# Patient Record
Sex: Female | Born: 1965 | Race: Black or African American | Hispanic: No | Smoking: Former smoker
Health system: Southern US, Community
[De-identification: ages and names within clinical notes are randomized; demographics above are authoritative.]

## PROBLEM LIST (undated history)

## (undated) DIAGNOSIS — M1990 Unspecified osteoarthritis, unspecified site: Secondary | ICD-10-CM

## (undated) DIAGNOSIS — IMO0002 Reserved for concepts with insufficient information to code with codable children: Secondary | ICD-10-CM

## (undated) DIAGNOSIS — D332 Benign neoplasm of brain, unspecified: Secondary | ICD-10-CM

## (undated) DIAGNOSIS — I10 Essential (primary) hypertension: Secondary | ICD-10-CM

## (undated) DIAGNOSIS — M329 Systemic lupus erythematosus, unspecified: Secondary | ICD-10-CM

## (undated) DIAGNOSIS — M549 Dorsalgia, unspecified: Secondary | ICD-10-CM

## (undated) HISTORY — PX: BRAIN SURGERY: SHX531

## (undated) HISTORY — PX: JOINT REPLACEMENT: SHX530

## (undated) HISTORY — PX: CORONARY ANGIOPLASTY WITH STENT PLACEMENT: SHX49

## (undated) HISTORY — PX: SHUNT REPLACEMENT: SHX5403

## (undated) HISTORY — PX: ABDOMINAL HYSTERECTOMY: SHX81

---

## 1998-10-26 ENCOUNTER — Emergency Department (HOSPITAL_COMMUNITY): Admission: EM | Admit: 1998-10-26 | Discharge: 1998-10-26 | Payer: Self-pay

## 1998-10-26 ENCOUNTER — Emergency Department (HOSPITAL_COMMUNITY): Admission: EM | Admit: 1998-10-26 | Discharge: 1998-10-26 | Payer: Self-pay | Admitting: Emergency Medicine

## 2011-10-23 ENCOUNTER — Emergency Department (HOSPITAL_COMMUNITY)
Admission: EM | Admit: 2011-10-23 | Discharge: 2011-10-24 | Disposition: A | Discharge status: Home / Self Care | Payer: Medicaid Other | Attending: Emergency Medicine | Admitting: Emergency Medicine

## 2011-10-23 ENCOUNTER — Encounter (HOSPITAL_COMMUNITY): Payer: Self-pay | Admitting: *Deleted

## 2011-10-23 ENCOUNTER — Emergency Department (INDEPENDENT_AMBULATORY_CARE_PROVIDER_SITE_OTHER)
Admission: EM | Admit: 2011-10-23 | Discharge: 2011-10-23 | Disposition: A | Discharge status: Nursing Home (Includes ICF) | Payer: Medicaid Other | Source: Home / Self Care | Attending: Emergency Medicine | Admitting: Emergency Medicine

## 2011-10-23 ENCOUNTER — Encounter: Payer: Self-pay | Admitting: *Deleted

## 2011-10-23 DIAGNOSIS — M25569 Pain in unspecified knee: Secondary | ICD-10-CM | POA: Insufficient documentation

## 2011-10-23 DIAGNOSIS — R609 Edema, unspecified: Secondary | ICD-10-CM | POA: Insufficient documentation

## 2011-10-23 DIAGNOSIS — R5383 Other fatigue: Secondary | ICD-10-CM | POA: Insufficient documentation

## 2011-10-23 DIAGNOSIS — T391X1A Poisoning by 4-Aminophenol derivatives, accidental (unintentional), initial encounter: Secondary | ICD-10-CM

## 2011-10-23 DIAGNOSIS — M329 Systemic lupus erythematosus, unspecified: Secondary | ICD-10-CM

## 2011-10-23 DIAGNOSIS — T394X1A Poisoning by antirheumatics, not elsewhere classified, accidental (unintentional), initial encounter: Secondary | ICD-10-CM

## 2011-10-23 DIAGNOSIS — M79609 Pain in unspecified limb: Secondary | ICD-10-CM | POA: Insufficient documentation

## 2011-10-23 DIAGNOSIS — R5381 Other malaise: Secondary | ICD-10-CM | POA: Insufficient documentation

## 2011-10-23 DIAGNOSIS — R110 Nausea: Secondary | ICD-10-CM | POA: Insufficient documentation

## 2011-10-23 DIAGNOSIS — IMO0001 Reserved for inherently not codable concepts without codable children: Secondary | ICD-10-CM | POA: Insufficient documentation

## 2011-10-23 DIAGNOSIS — M549 Dorsalgia, unspecified: Secondary | ICD-10-CM | POA: Insufficient documentation

## 2011-10-23 DIAGNOSIS — M129 Arthropathy, unspecified: Secondary | ICD-10-CM | POA: Insufficient documentation

## 2011-10-23 DIAGNOSIS — Z79899 Other long term (current) drug therapy: Secondary | ICD-10-CM | POA: Insufficient documentation

## 2011-10-23 HISTORY — DX: Dorsalgia, unspecified: M54.9

## 2011-10-23 HISTORY — DX: Unspecified osteoarthritis, unspecified site: M19.90

## 2011-10-23 HISTORY — DX: Systemic lupus erythematosus, unspecified: M32.9

## 2011-10-23 HISTORY — DX: Reserved for concepts with insufficient information to code with codable children: IMO0002

## 2011-10-23 HISTORY — DX: Benign neoplasm of brain, unspecified: D33.2

## 2011-10-23 NOTE — ED Notes (Signed)
Pt  Says  She  Has  Had  Lupos  For 20  Years  She  Reports  She  Is  Out  Of  Her pain meds      She  Reports  Pains  In her  Joints     And  She  States   She  Is  Having flair up of her lupus

## 2011-10-23 NOTE — ED Provider Notes (Signed)
History     CSN: [id number] Arrival date & time: 10/23/2011  8:39 PM   First MD Initiated Contact with Patient 10/23/11 2014      Chief Complaint  Patient presents with  . Joint Pain   HPI Comments: Pt presents for refill of percocet. States has been taking 12 10/325 pills/day since she filled rx for #60 on 11/25.  Also taking otc tylenol in addition to the percocet.  Was told to come to [facility] by family member because she was "taking too much." pt states is having lupus flare and is unable to keep her pain under control. No N/V, abd pain. C/o back pain, diffuse joint pain.   The history is provided by the patient.    Past Medical History  Diagnosis Date  . Arthritis   . Lupus   . Back pain   . Brain tumor (benign)     Past Surgical History  Procedure Date  . Abdominal hysterectomy   . Brain surgery   . Joint replacement   . Coronary angioplasty with stent placement   . Shunt replacement     Family History  Problem Relation Age of Onset  . Hypertension Mother   . Lupus Mother   . Hypertension Father   . Lupus Father     History  Substance Use Topics  . Smoking status: Current Everyday Smoker  . Smokeless tobacco: Not on file  . Alcohol Use: No    OB History    Grav Para Term Preterm Abortions TAB SAB Ect Mult Living                  Review of Systems  Constitutional: Negative for fever.  Respiratory: Negative for chest tightness and wheezing.   Cardiovascular: Negative for chest pain.  Gastrointestinal: Negative for nausea, vomiting and abdominal pain.  Musculoskeletal: Positive for back pain and arthralgias.  Skin: Negative for rash.  Neurological: Negative for weakness.    Allergies  Codeine and Morphine and related  Home Medications   Current Outpatient Rx  Name Route Sig Dispense Refill  . DIAZEPAM 10 MG PO TABS Oral Take 10 mg by mouth every 8 (eight) hours as needed.      Marland Kitchen HYDROCODONE-ACETAMINOPHEN 10-325 MG PO TABS Oral Take 1 tablet by  mouth every 6 (six) hours as needed.      Marland Kitchen HYDROXYCHLOROQUINE SULFATE 200 MG PO TABS Oral Take by mouth 2 (two) times daily.      . MULTIVITAMINS PO CAPS Oral Take 1 capsule by mouth daily.      . OXYCODONE HCL ER 30 MG PO TB12 Oral Take 30 mg by mouth every 12 (twelve) hours.      Marland Kitchen PREDNISONE 20 MG PO TABS Oral Take 20 mg by mouth daily.      Marland Kitchen ZOLPIDEM TARTRATE 10 MG PO TABS Oral Take 10 mg by mouth at bedtime as needed.        BP 158/92  Pulse 103  Temp(Src) 99.1 F (37.3 C) (Oral)  Resp 20  SpO2 100%  Physical Exam  Nursing note and vitals reviewed. Constitutional: She is oriented to person, place, and time. She appears well-developed and well-nourished. No distress.  HENT:  Head: Normocephalic and atraumatic.  Eyes: EOM are normal. Pupils are equal, round, and reactive to light.  Neck: Normal range of motion.  Cardiovascular: Normal rate, regular rhythm and normal heart sounds.   Pulmonary/Chest: Effort normal and breath sounds normal.  Abdominal: Soft. She exhibits no  distension. There is no tenderness. There is no rebound and no guarding.  Musculoskeletal: Normal range of motion.  Neurological: She is alert and oriented to person, place, and time.  Skin: Skin is warm and dry.  Psychiatric: She has a normal mood and affect. Her behavior is normal. Judgment and thought content normal.    ED Course  Procedures (including critical care time)  Labs Reviewed - No data to display No results found.   1. Tylenol ingestion      MDM  [place] narcotic database queried. Recent rx for percocet 10.325 #60 on 11/25 from provider in [place].   Pt taking 3900 mg tylenol + unknown amt of additional tylenol for lupus pain x 5 days. Concern for APAP toxicity. Discussed with patient importance of going to ED. Transferring for further evaluation.   Luiz Blare, MD 10/23/11 2213

## 2011-10-23 NOTE — ED Notes (Signed)
Pt  Reports  She  Has  Taken    approx  60  Pills  contining  Tylenol  Over a  5  Day  Period  In an attempt to    Relieve  Her  Pain  She     denys  Any abd  Pain nausea or  Vomiting  denys  Any suicidal ideations

## 2011-10-23 NOTE — ED Notes (Signed)
The pt has lupus and she has had a flare-up for the past  3-4 days..  She was [facility] for [facility] and she has been taking tylenol  And she needs her liver checked

## 2011-10-24 LAB — URINALYSIS, ROUTINE W REFLEX MICROSCOPIC
Glucose, UA: NEGATIVE mg/dL
Leukocytes, UA: NEGATIVE
Protein, ur: NEGATIVE mg/dL
pH: 5.5 (ref 5.0–8.0)

## 2011-10-24 LAB — DIFFERENTIAL
Basophils Relative: 0 % (ref 0–1)
Eosinophils Absolute: 0.1 10*3/uL (ref 0.0–0.7)
Eosinophils Relative: 1 % (ref 0–5)
Monocytes Absolute: 0.6 10*3/uL (ref 0.1–1.0)
Monocytes Relative: 5 % (ref 3–12)

## 2011-10-24 LAB — COMPREHENSIVE METABOLIC PANEL
Albumin: 3.5 g/dL (ref 3.5–5.2)
BUN: 9 mg/dL (ref 6–23)
Creatinine, Ser: 0.63 mg/dL (ref 0.50–1.10)
Total Bilirubin: 0.1 mg/dL — ABNORMAL LOW (ref 0.3–1.2)
Total Protein: 6.9 g/dL (ref 6.0–8.3)

## 2011-10-24 LAB — CBC
HCT: 40.1 % (ref 36.0–46.0)
Hemoglobin: 13.8 g/dL (ref 12.0–15.0)
MCH: 33 pg (ref 26.0–34.0)
MCHC: 34.4 g/dL (ref 30.0–36.0)

## 2011-10-24 MED ORDER — ONDANSETRON HCL 4 MG PO TABS: 4.0000 mg | ORAL_TABLET | Freq: Four times a day (QID) | ORAL | Status: AC

## 2011-10-24 MED ORDER — ONDANSETRON 4 MG PO TBDP
4.0000 mg | ORAL_TABLET | Freq: Once | ORAL | Status: AC
  Administered 2011-10-24: 4 mg via ORAL
  Filled 2011-10-24: qty 1

## 2011-10-24 MED ORDER — OXYCODONE HCL 10 MG PO TABS: 10.0000 mg | ORAL_TABLET | Freq: Three times a day (TID) | ORAL | Status: DC

## 2011-10-24 MED ORDER — OXYCODONE HCL 5 MG PO TABS: 15.0000 mg | ORAL_TABLET | ORAL | Status: AC | PRN

## 2011-10-24 MED ORDER — OXYCODONE HCL 5 MG PO TABS
10.0000 mg | ORAL_TABLET | Freq: Once | ORAL | Status: AC
  Administered 2011-10-24: 10 mg via ORAL
  Filled 2011-10-24: qty 2

## 2011-10-24 NOTE — ED Provider Notes (Signed)
Medical screening examination/treatment/procedure(s) were performed by non-physician practitioner and as supervising physician I was immediately available for consultation/collaboration.  Tripton Ned, MD 10/24/11 0655 

## 2011-10-24 NOTE — ED Provider Notes (Addendum)
History     CSN: [id number] Arrival date & time: 10/23/2011 10:37 PM   First MD Initiated Contact with Patient 10/23/11 2317      Chief Complaint  Patient presents with  . Back Pain    (Consider location/radiation/quality/duration/timing/severity/associated sxs/prior treatment) HPI Comments: This patient with a history of systemic lupus has been having a flare of pain in her joints for the past 3 days.  She usually takes 30 mg of oxycodone daily, but most recently was given prescription for Percocet with Tylenol.  She is concerned for her liver.  She has had elevated liver enzymes in the past due to overusing Tylenol.  She reports mild nausea without vomiting.  No dysuria, diarrhea  Patient is a 45 y.o. female presenting with back pain. The history is provided by the patient.  Back Pain  This is a recurrent problem. The current episode started more than 2 days ago. The problem occurs constantly. The problem has not changed since onset.The quality of the pain is described as aching. The pain radiates to the left thigh, left knee, right thigh and right knee. The pain is at a severity of 6/10. The pain is moderate. The symptoms are aggravated by certain positions. Associated symptoms include leg pain and weakness. Pertinent negatives include no fever, no numbness, no weight loss, no headaches, no perianal numbness, no dysuria, no pelvic pain and no paresthesias. She has tried analgesics for the symptoms. Risk factors include obesity.    Past Medical History  Diagnosis Date  . Arthritis   . Lupus   . Back pain   . Brain tumor (benign)     Past Surgical History  Procedure Date  . Abdominal hysterectomy   . Brain surgery   . Joint replacement   . Coronary angioplasty with stent placement   . Shunt replacement     Family History  Problem Relation Age of Onset  . Hypertension Mother   . Lupus Mother   . Hypertension Father   . Lupus Father     History  Substance Use Topics  .  Smoking status: Current Everyday Smoker  . Smokeless tobacco: Not on file  . Alcohol Use: No    OB History    Grav Para Term Preterm Abortions TAB SAB Ect Mult Living                  Review of Systems  Constitutional: Negative for fever and weight loss.  HENT: Negative.   Eyes: Negative.   Respiratory: Negative.   Cardiovascular: Negative.   Gastrointestinal: Positive for nausea. Negative for vomiting, diarrhea and constipation.  Genitourinary: Negative for dysuria, frequency and pelvic pain.  Musculoskeletal: Positive for myalgias.  Skin: Negative.   Neurological: Positive for weakness. Negative for numbness, headaches and paresthesias.  Hematological: Negative.   Psychiatric/Behavioral: Negative.     Allergies  Codeine and Morphine and related  Home Medications   Current Outpatient Rx  Name Route Sig Dispense Refill  . DIAZEPAM 10 MG PO TABS Oral Take 10 mg by mouth every 8 (eight) hours as needed. For anxiety.    Marland Kitchen HYDROCODONE-ACETAMINOPHEN 10-325 MG PO TABS Oral Take 1 tablet by mouth every 6 (six) hours as needed. For pain.    Marland Kitchen HYDROXYCHLOROQUINE SULFATE 200 MG PO TABS Oral Take by mouth 2 (two) times daily.      . OXYCODONE HCL ER 30 MG PO TB12 Oral Take 30 mg by mouth every 12 (twelve) hours.      Marland Kitchen  PREDNISONE 20 MG PO TABS Oral Take 20 mg by mouth daily.      Marland Kitchen ZOLPIDEM TARTRATE 10 MG PO TABS Oral Take 10 mg by mouth at bedtime as needed. For sleep.    . MULTIVITAMINS PO CAPS Oral Take 1 capsule by mouth daily.      Marland Kitchen ONDANSETRON HCL 4 MG PO TABS Oral Take 1 tablet (4 mg total) by mouth every 6 (six) hours. 12 tablet 0  . OXYCODONE HCL 5 MG PO TABS Oral Take 3 tablets (15 mg total) by mouth every 4 (four) hours as needed for pain. 20 tablet 0    BP 175/102  Pulse 74  Temp(Src) 98.8 F (37.1 C) (Oral)  Resp 19  SpO2 98%  Physical Exam  Constitutional: She is oriented to person, place, and time. She appears well-developed and well-nourished. No distress.    HENT:  Head: Normocephalic.  Eyes: EOM are normal.  Neck: Normal range of motion.  Cardiovascular: Normal rate.   Pulmonary/Chest: Effort normal. No respiratory distress. She exhibits no tenderness.  Abdominal: Soft. Bowel sounds are normal.  Musculoskeletal: She exhibits edema and tenderness.  Neurological: She is oriented to person, place, and time.  Skin: Skin is warm and dry. She is not diaphoretic.    ED Course  Procedures (including critical care time)  Labs Reviewed  CBC - Abnormal; Notable for the following:    WBC 11.3 (*)    All other components within normal limits  DIFFERENTIAL - Abnormal; Notable for the following:    Lymphs Abs 4.8 (*)    All other components within normal limits  COMPREHENSIVE METABOLIC PANEL - Abnormal; Notable for the following:    Glucose, Bld 103 (*)    Total Bilirubin 0.1 (*)    All other components within normal limits  URINALYSIS, ROUTINE W REFLEX MICROSCOPIC - Abnormal; Notable for the following:    Bilirubin Urine SMALL (*)    Ketones, ur 15 (*)    All other components within normal limits   No results found.   1. Lupus arthritis       MDM    We'll evaluate LFTs administer ODT Zofran for nausea, as well as by mouth oxycodone 10 mg for pain control, per patient's history and physical exam, most likely Lupus flare, if I can get her pain controlled and nausea controlled and ED we'll send home with prescriptions for the oxycodone and Zofran        Arman Filter, NP 10/24/11 0247  Arman Filter, NP 10/24/11 0251  Arman Filter, NP 10/24/11 2157  Arman Filter, NP 10/24/11 2157

## 2011-10-24 NOTE — ED Notes (Signed)
Pt denies any questions upon discharge. 

## 2011-10-25 NOTE — ED Provider Notes (Signed)
Medical screening examination/treatment/procedure(s) were conducted as a shared visit with non-physician practitioner(s) and myself.  I personally evaluated the patient during the encounter  Doug Sou, MD 10/25/11 [phone]

## 2019-07-23 ENCOUNTER — Emergency Department (HOSPITAL_COMMUNITY)
Admission: EM | Admit: 2019-07-23 | Discharge: 2019-07-23 | Disposition: A | Discharge status: Home / Self Care | Payer: Self-pay | Attending: Emergency Medicine | Admitting: Emergency Medicine

## 2019-07-23 ENCOUNTER — Other Ambulatory Visit: Payer: Self-pay

## 2019-07-23 DIAGNOSIS — R51 Headache: Secondary | ICD-10-CM | POA: Insufficient documentation

## 2019-07-23 DIAGNOSIS — Z79899 Other long term (current) drug therapy: Secondary | ICD-10-CM | POA: Insufficient documentation

## 2019-07-23 DIAGNOSIS — E669 Obesity, unspecified: Secondary | ICD-10-CM | POA: Insufficient documentation

## 2019-07-23 DIAGNOSIS — M5431 Sciatica, right side: Secondary | ICD-10-CM | POA: Insufficient documentation

## 2019-07-23 DIAGNOSIS — Z955 Presence of coronary angioplasty implant and graft: Secondary | ICD-10-CM | POA: Insufficient documentation

## 2019-07-23 DIAGNOSIS — F17200 Nicotine dependence, unspecified, uncomplicated: Secondary | ICD-10-CM | POA: Insufficient documentation

## 2019-07-23 MED ORDER — OXYCODONE-ACETAMINOPHEN 5-325 MG PO TABS
1.0000 | ORAL_TABLET | Freq: Once | ORAL | Status: AC
  Administered 2019-07-23: 1 via ORAL
  Filled 2019-07-23: qty 1

## 2019-07-23 MED ORDER — PREDNISONE 20 MG PO TABS: 40.0000 mg | ORAL_TABLET | Freq: Every day | ORAL | 0 refills | Status: AC

## 2019-07-23 MED ORDER — METHOCARBAMOL 500 MG PO TABS: 500.0000 mg | ORAL_TABLET | Freq: Two times a day (BID) | ORAL | 0 refills | Status: AC

## 2019-07-23 MED ORDER — LIDOCAINE 5 % EX PTCH: 1.0000 | MEDICATED_PATCH | CUTANEOUS | 0 refills | Status: AC

## 2019-07-23 MED ORDER — DIAZEPAM 5 MG PO TABS
5.0000 mg | ORAL_TABLET | Freq: Once | ORAL | Status: AC
  Administered 2019-07-23: 5 mg via ORAL
  Filled 2019-07-23: qty 1

## 2019-07-23 NOTE — ED Triage Notes (Signed)
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

## 2019-07-23 NOTE — Discharge Instructions (Addendum)
I have prescribed muscle relaxers for your pain, please do not drink or drive while taking this medications as they can make you drowsy.  I have also prescribed steroids, he is to be advised this medication can cause insomnia, appetite changes. Please  follow-up with PCP in 1 week for reevaluation of your symptoms.    If you experience any bowel or bladder incontinence, fever, worsening in your symptoms please return to the ED.

## 2019-07-23 NOTE — ED Provider Notes (Signed)
[facility] EMERGENCY DEPARTMENT Provider Note   CSN: [id number] Arrival date & time: 07/23/19  1452     History   Chief Complaint Chief Complaint  Patient presents with  . Back Pain  . Headache    HPI Sierra Atkins is a 53 y.o. female.     53 y.o female with a PMH of Lupus, Brain tumor (benign) presents to the ED with a chief complaint of right sided sciatic pain x 1 month. Patient reports she feeling a sharp pain which originates at her right buttock and radiates down her right leg she describes this as "a flare up" which she has had in the past.  Patient has attempted Tylenol, Goody powders, without improvement in symptoms.  She reports this is now not allowing her to get adequate rest at night, reports she has been trying to relieve her pain by running a hot shower on her bottom.  She does have a history of sciatica, was previously treated with steroid injections.  Patient second complaint includes a headache, she reports she believes that this is due to the stress she has been under as she recently came down to [facility] in order to have her daughter's fianc buried.  She does report increase in activity, she is now ambulating with a cane due to her leg pain.  Although she has a previous history of intracranial hemorrhage, reports the pain does not feel like it.  She denies any vomiting, photophobia, weakness, urinary retention, bowel incontinence or fevers.  The history is provided by the patient and medical records.  Back Pain Associated symptoms: headaches   Associated symptoms: no abdominal pain, no chest pain and no fever   Headache Associated symptoms: back pain and myalgias   Associated symptoms: no abdominal pain, no fever, no nausea, no photophobia, no sinus pressure and no vomiting     Past Medical History:  Diagnosis Date  . Arthritis   . Back pain   . Brain tumor (benign)   . Lupus     There are no active problems to display for this  patient.   Past Surgical History:  Procedure Laterality Date  . ABDOMINAL HYSTERECTOMY    . BRAIN SURGERY    . CORONARY ANGIOPLASTY WITH STENT PLACEMENT    . JOINT REPLACEMENT    . SHUNT REPLACEMENT       OB History   No obstetric history on file.      Home Medications    Prior to Admission medications   Medication Sig Start Date End Date Taking? Authorizing Provider  diazepam (VALIUM) 10 MG tablet Take 10 mg by mouth every 8 (eight) hours as needed. For anxiety.    [provider]  HYDROcodone-acetaminophen (NORCO) 10-325 MG per tablet Take 1 tablet by mouth every 6 (six) hours as needed. For pain.    [provider]  hydroxychloroquine (PLAQUENIL) 200 MG tablet Take by mouth 2 (two) times daily.      [provider]  Multiple Vitamin (MULTIVITAMIN) capsule Take 1 capsule by mouth daily.      [provider]  oxycodone (OXYCONTIN) 30 MG TB12 Take 30 mg by mouth every 12 (twelve) hours.      [provider]  Oxycodone HCl 10 MG TABS Take 1 tablet (10 mg total) by mouth 3 (three) times daily. 10/24/11   Junius Creamer, NP  predniSONE (DELTASONE) 20 MG tablet Take 20 mg by mouth daily.      [provider]  zolpidem (AMBIEN) 10 MG tablet Take 10 mg by mouth at bedtime as needed. For sleep.    [provider]    Family History Family History  Problem Relation Age of Onset  . Hypertension Mother   . Lupus Mother   . Hypertension Father   . Lupus Father     Social History Social History   Tobacco Use  . Smoking status: Current Every Day Smoker  Substance Use Topics  . Alcohol use: No  . Drug use: No     Allergies   Codeine and Morphine and related   Review of Systems Review of Systems  Constitutional: Negative for fever.  HENT: Negative for sinus pressure.   Eyes: Negative for photophobia.  Respiratory: Negative for shortness of breath.   Cardiovascular: Negative for chest pain.  Gastrointestinal:  Negative for abdominal pain, nausea and vomiting.  Genitourinary: Negative for flank pain.  Musculoskeletal: Positive for back pain and myalgias.  Skin: Negative for pallor and wound.  Neurological: Positive for headaches. Negative for syncope.     Physical Exam Updated Vital Signs BP (!) 165/80   Pulse 65   Temp 98.5 F (36.9 C) (Oral)   Resp 17   SpO2 97%   Physical Exam Vitals signs and nursing note reviewed.  Constitutional:      Appearance: She is well-developed. She is obese.  HENT:     Head: Normocephalic and atraumatic.  Pulmonary:     Effort: Pulmonary effort is normal.     Breath sounds: Normal breath sounds. No decreased breath sounds, wheezing, rhonchi or rales.  Abdominal:     General: Bowel sounds are normal.     Palpations: Abdomen is soft.  Neurological:     Mental Status: She is alert.     Comments: No facial asymmetry, no dysarthria.  RLE- KF,KE 5/5 strength LLE- HF, HE 5/5 strength Normal gait. No pronator drift. No leg drop.  Patellar reflexes present and symmetric. CN I, II and VIII not tested. CN II-XII grossly intact bilaterally.         ED Treatments / Results  Labs (all labs ordered are listed, but only abnormal results are displayed) Labs Reviewed - No data to display  EKG None  Radiology No results found.  Procedures Procedures (including critical care time)  Medications Ordered in ED Medications  oxyCODONE-acetaminophen (PERCOCET/ROXICET) 5-325 MG per tablet 1 tablet (1 tablet Oral Given 07/23/19 1615)  diazepam (VALIUM) tablet 5 mg (5 mg Oral Given 07/23/19 1615)     Initial Impression / Assessment and Plan / ED Course  I have reviewed the triage vital signs and the nursing notes.  Pertinent labs & imaging results that were available during my care of the patient were reviewed by me and considered in my medical decision making (see chart for details).       Patient with a prior history of lupus presents to the ED with  complaints of a sciatic nerve flareup.  Patient has had this in the past, states she has been increasing her activity lately as she was currently arranging daughter's fianc's funeral in process.  She is currently walking with a cane, reports the pain along her right buttocks is severe in nature.  Has been affecting her sleep along with rest.  She has taken some over-the-counter medication without improvement in symptoms.  Reports that she usually has this treated with steroid injections, muscle relaxers.  Denies any red flags such as urinary retention, bowel incontinence, fevers, IV drug  use or history of cancer.  Second complaint for patient includes a headache, reports has been under a lot of stress lately.  She does have a previous history of intracranial hemorrhage, reports this pain does not feel anything like it, states she is has no photophobia, no vomiting, no dizziness, no visual field changes.  She reports suspecting that her headache is likely coming from the stress she is been under.  She is neurologically intact on my exam.  We will provide her with pain medication to help with her back pain issues.  Patient did not drive today and has daughter picking her up therefore she is adequate for muscle relaxers while in the ED.  4:43 PM patient was provided with Percocet, Valium to help with her pain in the ED.  Upon reassessment of patient, she reports she needs to go home on hydrocodone, explained to patient extensively that we do not prescribe narcotics to treat chronic pain.  She will need to follow-up with her PCP.  Patient then became aggravated stating "I am not going to be in pain here, I need to talk to her supervisor ".  I then proceeded to explain to patient that I agreed to send her home with muscle relaxers along with a steroid burst to help with her pain which has helped her in the past along with some lidocaine patches.  Patient then kept requesting supervising physician.  I spoke to Dr.  Laverta Baltimore who has agreed to see patient.  I have reviewed the [facility], patient does not have any prescriptions for narcotics recently, unfortunately I am unable to provide her with opioid therapy for a chronic complaint.  4:57 PM Dr. Laverta Baltimore has spoken to patient she is agreeable to discharge,return precautions provided.    Portions of this note were generated with [employer]. Dictation errors may occur despite best attempts at proofreading.  Final Clinical Impressions(s) / ED Diagnoses   Final diagnoses:  Chronic sciatica of right side    ED Discharge Orders    None       Janeece Fitting, PA-C 07/23/19 1657    Long, Wonda [facility], MD 07/24/19 1035

## 2019-07-23 NOTE — ED Notes (Signed)
Pt upset she cannot get a narcotic prescription. States she feels like she is being discriminated against and states "I am no an addict."

## 2019-08-11 ENCOUNTER — Encounter (HOSPITAL_COMMUNITY): Payer: Self-pay

## 2019-08-11 ENCOUNTER — Emergency Department (HOSPITAL_COMMUNITY): Payer: Self-pay

## 2019-08-11 DIAGNOSIS — D332 Benign neoplasm of brain, unspecified: Secondary | ICD-10-CM | POA: Insufficient documentation

## 2019-08-11 DIAGNOSIS — Z955 Presence of coronary angioplasty implant and graft: Secondary | ICD-10-CM | POA: Insufficient documentation

## 2019-08-11 DIAGNOSIS — Z9660 Presence of unspecified orthopedic joint implant: Secondary | ICD-10-CM | POA: Insufficient documentation

## 2019-08-11 DIAGNOSIS — M25551 Pain in right hip: Secondary | ICD-10-CM | POA: Insufficient documentation

## 2019-08-11 DIAGNOSIS — Y939 Activity, unspecified: Secondary | ICD-10-CM | POA: Insufficient documentation

## 2019-08-11 DIAGNOSIS — F17200 Nicotine dependence, unspecified, uncomplicated: Secondary | ICD-10-CM | POA: Insufficient documentation

## 2019-08-11 DIAGNOSIS — Z79899 Other long term (current) drug therapy: Secondary | ICD-10-CM | POA: Insufficient documentation

## 2019-08-11 DIAGNOSIS — W133XXA Fall through floor, initial encounter: Secondary | ICD-10-CM | POA: Insufficient documentation

## 2019-08-11 DIAGNOSIS — Y92002 Bathroom of unspecified non-institutional (private) residence single-family (private) house as the place of occurrence of the external cause: Secondary | ICD-10-CM | POA: Insufficient documentation

## 2019-08-11 DIAGNOSIS — I10 Essential (primary) hypertension: Secondary | ICD-10-CM | POA: Insufficient documentation

## 2019-08-11 DIAGNOSIS — M545 Low back pain: Secondary | ICD-10-CM | POA: Insufficient documentation

## 2019-08-11 DIAGNOSIS — M79604 Pain in right leg: Secondary | ICD-10-CM | POA: Insufficient documentation

## 2019-08-11 DIAGNOSIS — Y999 Unspecified external cause status: Secondary | ICD-10-CM | POA: Insufficient documentation

## 2019-08-11 NOTE — ED Triage Notes (Signed)
Pt states she fell through the bathroom floor. Pt states she was able to get up with minimal assistance, but was very difficult.. Pt states she has right hip pain down to to leg.

## 2019-08-12 ENCOUNTER — Emergency Department (HOSPITAL_COMMUNITY)
Admission: EM | Admit: 2019-08-12 | Discharge: 2019-08-12 | Disposition: A | Discharge status: Home / Self Care | Payer: Self-pay | Attending: Emergency Medicine | Admitting: Emergency Medicine

## 2019-08-12 ENCOUNTER — Other Ambulatory Visit: Payer: Self-pay

## 2019-08-12 DIAGNOSIS — I10 Essential (primary) hypertension: Secondary | ICD-10-CM

## 2019-08-12 DIAGNOSIS — W19XXXA Unspecified fall, initial encounter: Secondary | ICD-10-CM

## 2019-08-12 DIAGNOSIS — M79604 Pain in right leg: Secondary | ICD-10-CM

## 2019-08-12 MED ORDER — OXYCODONE-ACETAMINOPHEN 5-325 MG PO TABS: 1.0000 | ORAL_TABLET | Freq: Four times a day (QID) | ORAL | 0 refills | Status: DC | PRN

## 2019-08-12 MED ORDER — HYDROCHLOROTHIAZIDE 25 MG PO TABS: 25.0000 mg | ORAL_TABLET | Freq: Every day | ORAL | 0 refills | Status: DC

## 2019-08-12 NOTE — Discharge Instructions (Addendum)
Today I recommended x-rays of your lower back, which you chose to decline and to return here if your symptoms worsen.  We discussed the risks of this.  Today you received medications that may make you sleepy or impair your ability to make decisions.  For the next 24 hours please do not drive, operate heavy machinery, care for a small child with out another adult present, or perform any activities that may cause harm to you or someone else if you were to fall asleep or be impaired.   You are being prescribed a medication which may make you sleepy. Please follow up of listed precautions for at least 24 hours after taking one dose.

## 2019-08-12 NOTE — ED Notes (Signed)
Have pt call daughter when ready to be picked up

## 2019-08-12 NOTE — ED Provider Notes (Signed)
[facility] DEPT Provider Note   CSN: [id number] Arrival date & time: 08/11/19  1845     History   Chief Complaint Chief Complaint  Patient presents with  . Fall    HPI Sierra Atkins is a 53 y.o. female with a past medical history of lupus, chronic back pain, who presents today for evaluation after fall.  She reports that she was in the bathroom when the floor gave way and she fell through the bathroom floor with her right leg going through the floor.  She states that she was able to get up and has been able to walk however she has pain in her lower back and right hip down the leg.  She denies any weakness.  She has a history of chronic pain.  She denies any wounds.  She states she did not strike her head or pass out.     HPI  Past Medical History:  Diagnosis Date  . Arthritis   . Back pain   . Brain tumor (benign) ([facility])   . Lupus ([place])     There are no active problems to display for this patient.   Past Surgical History:  Procedure Laterality Date  . ABDOMINAL HYSTERECTOMY    . BRAIN SURGERY    . CORONARY ANGIOPLASTY WITH STENT PLACEMENT    . JOINT REPLACEMENT    . SHUNT REPLACEMENT       OB History   No obstetric history on file.      Home Medications    Prior to Admission medications   Medication Sig Start Date End Date Taking? Authorizing Provider  hydroxychloroquine (PLAQUENIL) 200 MG tablet Take by mouth 2 (two) times daily.     Yes [provider]  Multiple Vitamin (MULTIVITAMIN) capsule Take 1 capsule by mouth daily.     Yes [provider]  Omega-3 Fatty Acids (FISH OIL) 1000 MG CAPS Take 2 capsules by mouth daily.   Yes [provider]  predniSONE (DELTASONE) 20 MG tablet Take 20 mg by mouth daily with breakfast.   Yes [provider]  Probiotic Product (PROBIOTIC PO) Take 1 capsule by mouth daily.   Yes [provider]  zolpidem (AMBIEN) 10 MG tablet Take 10 mg by mouth at  bedtime as needed for sleep. For sleep.   Yes [provider]  hydrochlorothiazide (HYDRODIURIL) 25 MG tablet Take 1 tablet (25 mg total) by mouth daily for 14 days. 08/12/19 08/26/19  Lorin Glass, PA-C  Oxycodone HCl 10 MG TABS Take 1 tablet (10 mg total) by mouth 3 (three) times daily. Patient not taking: Reported on 08/12/2019 10/24/11   Junius Creamer, NP  oxyCODONE-acetaminophen (PERCOCET/ROXICET) 5-325 MG tablet Take 1 tablet by mouth every 6 (six) hours as needed for severe pain. 08/12/19   Lorin Glass, PA-C    Family History Family History  Problem Relation Age of Onset  . Hypertension Mother   . Lupus Mother   . Hypertension Father   . Lupus Father     Social History Social History   Tobacco Use  . Smoking status: Current Every Day Smoker  Substance Use Topics  . Alcohol use: No  . Drug use: No     Allergies   Codeine and Morphine and related   Review of Systems Review of Systems  Constitutional: Negative for chills and fever.  Musculoskeletal: Positive for back pain. Negative for neck pain.       Right leg pain  Skin: Negative  for color change and wound.  Neurological: Negative for weakness and headaches.  All other systems reviewed and are negative.    Physical Exam Updated Vital Signs BP (!) 190/105 (BP Location: Left Arm)   Pulse 87   Temp 99 F (37.2 C) (Oral)   Resp 18   SpO2 100%   Physical Exam Vitals signs and nursing note reviewed.  Constitutional:      General: She is not in acute distress.    Appearance: She is obese.  HENT:     Head: Normocephalic and atraumatic.  Neck:     Musculoskeletal: Normal range of motion. No neck rigidity.  Cardiovascular:     Pulses: Normal pulses.     Comments: 2+ right-sided DP/PT pulse. Pulmonary:     Effort: Pulmonary effort is normal. No respiratory distress.  Abdominal:     General: Abdomen is flat.     Tenderness: There is no abdominal tenderness.  Musculoskeletal:      Right lower leg: No edema.     Left lower leg: No edema.     Comments: She has mild midline L-spine tenderness to palpation along with significant right-sided lateral lumbar, right-sided buttock/hip and right upper and lower leg tenderness to palpation.  Exam is limited by body habitus however there is no significant step-offs or deformities.  She is able to bear weight using a cane and ambulate however doing so is mildly painful.  Skin:    General: Skin is warm and dry.     Comments: No lacerations present on right leg.  There is no significant ecchymosis.    Neurological:     Mental Status: She is alert.     Comments: Sensation intact to light touch to bilateral lower extremities.  She is awake and alert, able to speak without difficulty.  Psychiatric:        Mood and Affect: Mood normal.        Behavior: Behavior normal.      ED Treatments / Results  Labs (all labs ordered are listed, but only abnormal results are displayed) Labs Reviewed - No data to display  EKG None  Radiology Dg Hip Unilat  With Pelvis 2-3 Views Right  Result Date: 08/11/2019 CLINICAL DATA:  [place] through the floor EXAM: DG HIP (WITH OR WITHOUT PELVIS) 2-3V RIGHT COMPARISON:  None. FINDINGS: Advanced bilateral hip osteoarthritis is seen with superior joint space loss and large marginal osteophyte formation. No definite fracture is seen. IMPRESSION: Advanced bilateral hip osteoarthritis. No definite acute osseous abnormality. If there is high clinical suspicion for occult hip fracture or the patient refuses to weightbear, consider further evaluation with MRI. Electronically Signed   By: Prudencio Pair M.D.   On: 08/11/2019 21:06    Procedures Procedures (including critical care time)  Medications Ordered in ED Medications - No data to display   Initial Impression / Assessment and Plan / ED Course  I have reviewed the triage vital signs and the nursing notes.  Pertinent labs & imaging results that were  available during my care of the patient were reviewed by me and considered in my medical decision making (see chart for details).       Patient presents today for evaluation after she had her right leg go through a floor when it gave way.  On exam she has diffuse tenderness to the entire right leg, both upper and lower along with the right hip and lumbar back and right sided lumbar lateral area.  She does  not have any specific one area that is worse.  She denies striking her head or passing out.  X-rays were obtained of the pelvis and right-sided hip without evidence of acute fracture or abnormality.  She was able to weight-bear.  I recommended additional x-rays including L-spine as she does have midline tenderness to palpation, however she states that she has chronic lumbar pain and refuses this.  We discussed the risks of making this decision and she states that she does not wish to have additional x-rays or testing done tonight and will come back if her pain worsens or she has other concerns.  She was given Percocet in the emergency room, [facility] PMP was consulted and she is given prescription for additional Percocet at home.     Return precautions were discussed with patient who states their understanding.  At the time of discharge patient denied any unaddressed complaints or concerns.  Patient is agreeable for discharge home.  While in the emergency room her blood pressure was elevated.  She states that she has run out of her HCTZ and took her last pill this morning and is requesting refill.  She is given a prescription for refill of HCTZ as she states this is a chronic maintenance medicine for her.   Final Clinical Impressions(s) / ED Diagnoses   Final diagnoses:  Fall, initial encounter  Right leg pain  Hypertension, unspecified type    ED Discharge Orders         Ordered    hydrochlorothiazide (HYDRODIURIL) 25 MG tablet  Daily     08/12/19 0143    oxyCODONE-acetaminophen  (PERCOCET/ROXICET) 5-325 MG tablet  Every 6 hours PRN     08/12/19 0143           Lorin Glass, PA-C 08/12/19 0154    Ezequiel Essex, MD 08/12/19 1712

## 2019-10-15 IMAGING — CR DG HIP (WITH OR WITHOUT PELVIS) 2-3V*R*
3 series · 3 of 3 positions shown · non-contrast
Comparison: None.

CLINICAL DATA: Fell through the floor

EXAM:
DG HIP (WITH OR WITHOUT PELVIS) 2-3V RIGHT

[t pelvis ap]
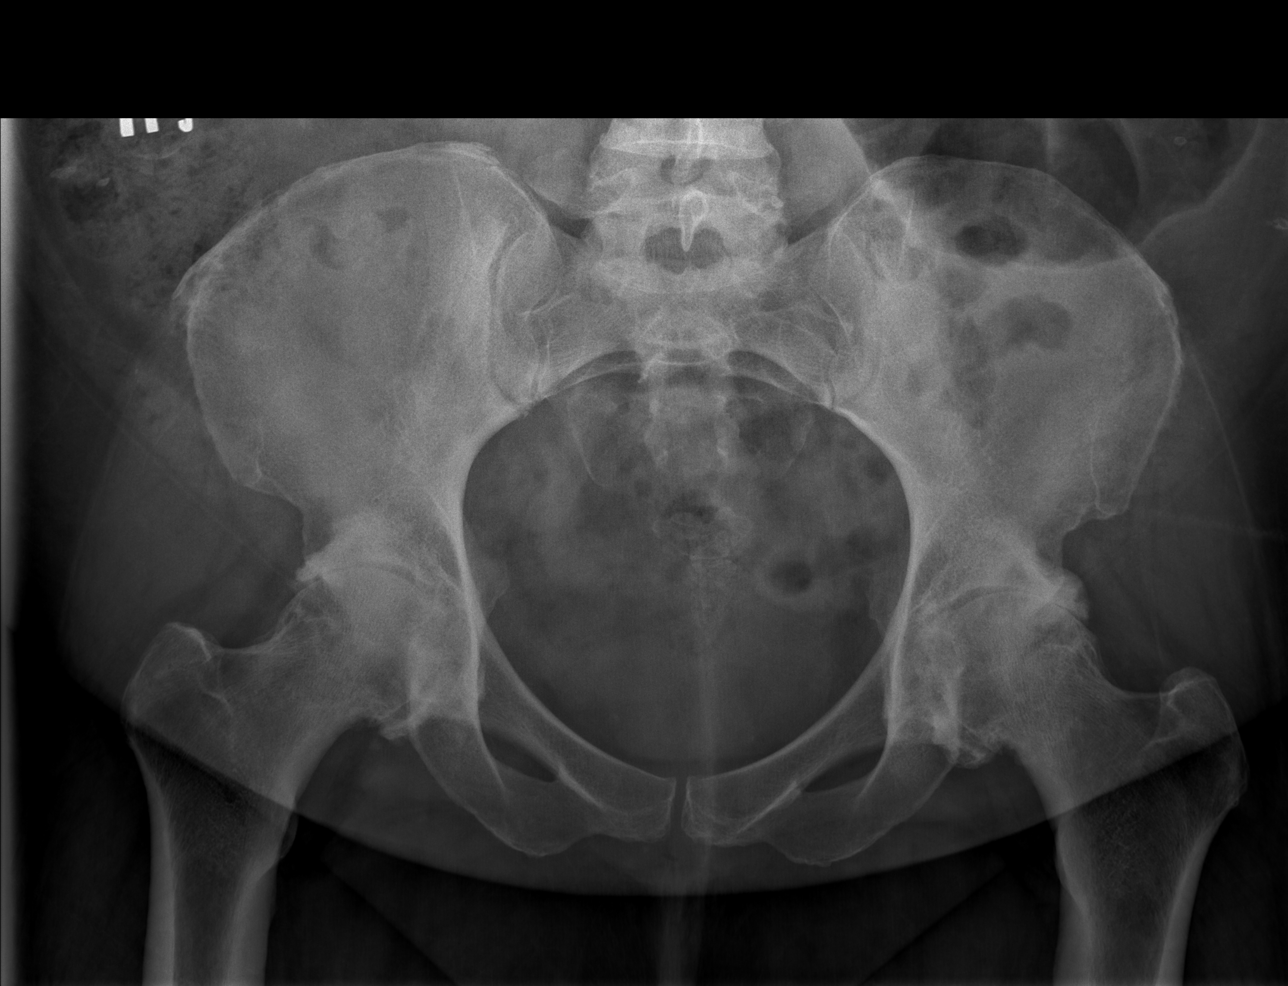

[t hip ap right]
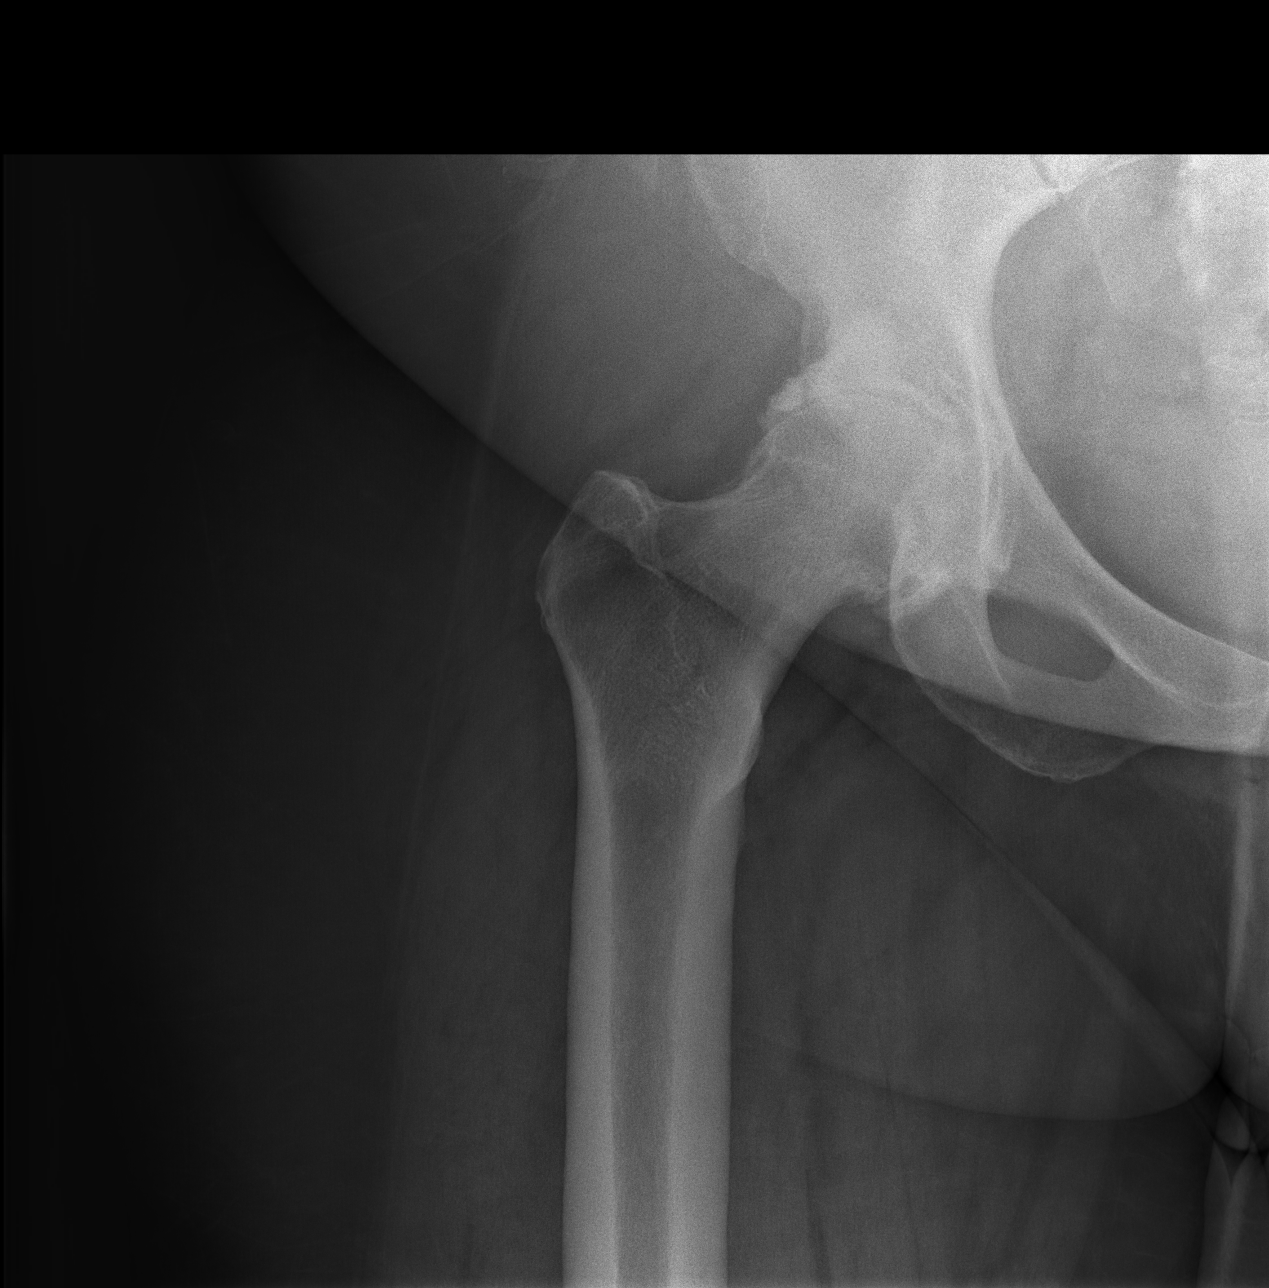

[t hip frog leg right]
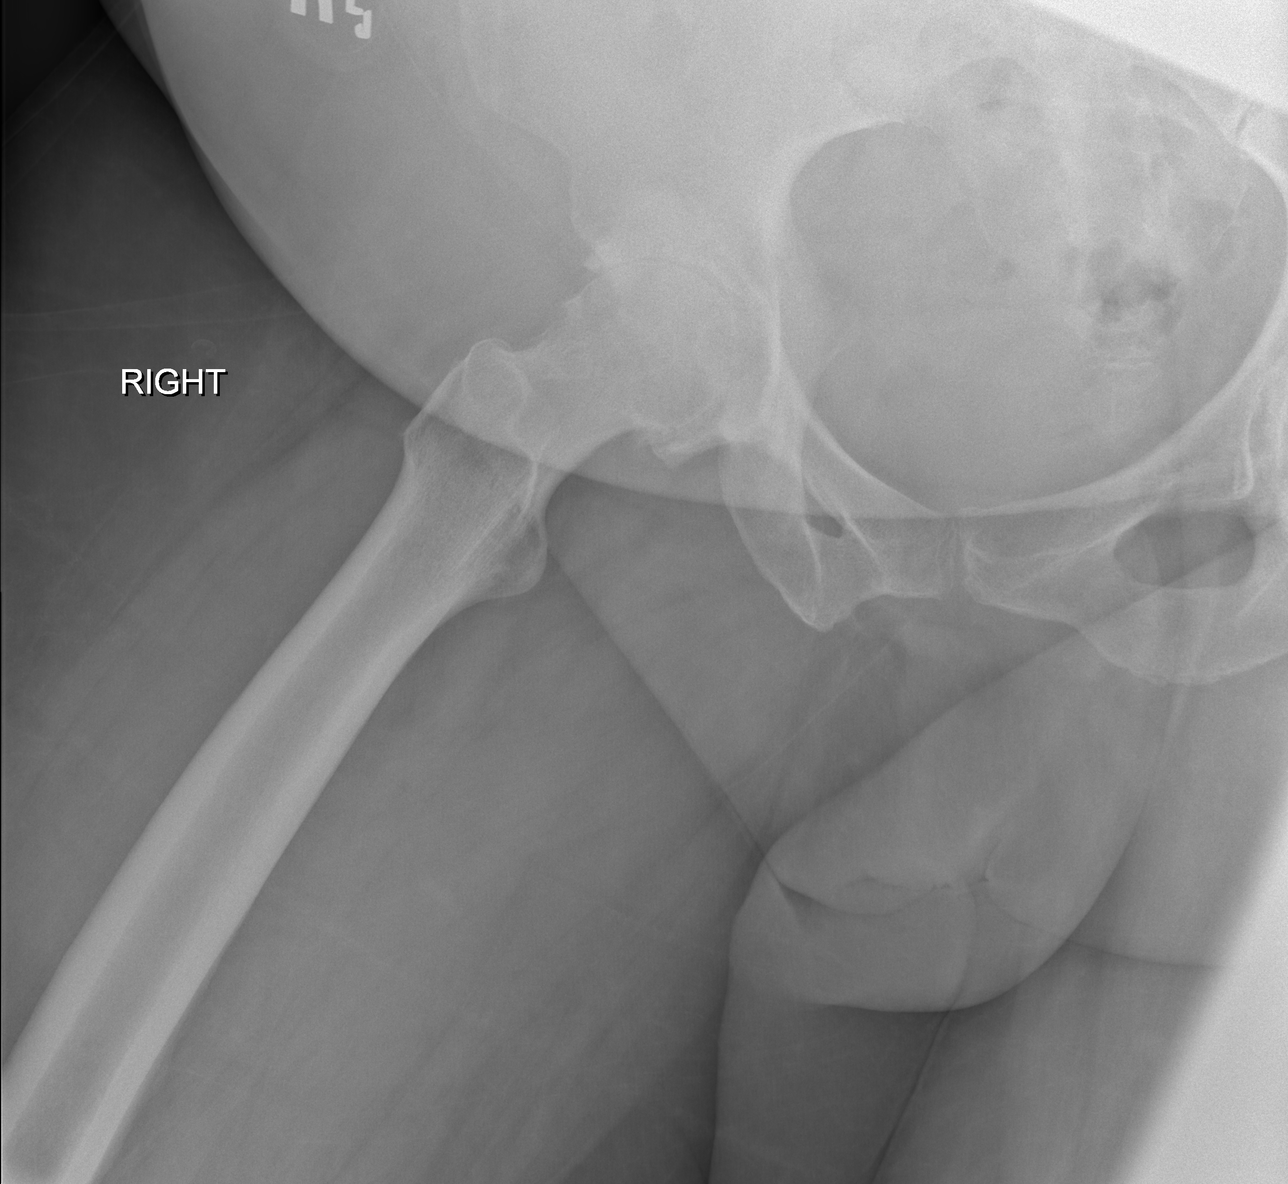

[3 of 3 positions shown; findings below may reference images not displayed]

FINDINGS: Advanced bilateral hip osteoarthritis is seen with superior joint
space loss and large marginal osteophyte formation. No definite
fracture is seen.
IMPRESSION: Advanced bilateral hip osteoarthritis. No definite acute osseous
abnormality. If there is high clinical suspicion for occult hip
fracture or the patient refuses to weightbear, consider further
evaluation with MRI.

## 2022-07-31 ENCOUNTER — Emergency Department (HOSPITAL_COMMUNITY)
Admission: EM | Admit: 2022-07-31 | Discharge: 2022-07-31 | Disposition: A | Discharge status: Home / Self Care | Payer: Medicaid Other | Attending: Emergency Medicine | Admitting: Emergency Medicine

## 2022-07-31 ENCOUNTER — Emergency Department (HOSPITAL_COMMUNITY): Payer: Medicaid Other

## 2022-07-31 ENCOUNTER — Other Ambulatory Visit: Payer: Self-pay

## 2022-07-31 DIAGNOSIS — I10 Essential (primary) hypertension: Secondary | ICD-10-CM | POA: Insufficient documentation

## 2022-07-31 DIAGNOSIS — R109 Unspecified abdominal pain: Secondary | ICD-10-CM | POA: Insufficient documentation

## 2022-07-31 DIAGNOSIS — R030 Elevated blood-pressure reading, without diagnosis of hypertension: Secondary | ICD-10-CM

## 2022-07-31 DIAGNOSIS — R10A2 Flank pain, left side: Secondary | ICD-10-CM

## 2022-07-31 DIAGNOSIS — Z20822 Contact with and (suspected) exposure to covid-19: Secondary | ICD-10-CM | POA: Insufficient documentation

## 2022-07-31 DIAGNOSIS — R519 Headache, unspecified: Secondary | ICD-10-CM

## 2022-07-31 LAB — URINALYSIS, ROUTINE W REFLEX MICROSCOPIC
Bacteria, UA: NONE SEEN
Bilirubin Urine: NEGATIVE
Glucose, UA: NEGATIVE mg/dL
Hgb urine dipstick: NEGATIVE
Ketones, ur: NEGATIVE mg/dL
Nitrite: NEGATIVE
Protein, ur: NEGATIVE mg/dL
Specific Gravity, Urine: >1.046 — ABNORMAL HIGH (ref 1.005–1.030)
pH: 6 (ref 5.0–8.0)

## 2022-07-31 LAB — TROPONIN I (HIGH SENSITIVITY)
Troponin I (High Sensitivity): 17 ng/L (ref <18)
Troponin I (High Sensitivity): 17 ng/L (ref <18)

## 2022-07-31 LAB — COMPREHENSIVE METABOLIC PANEL
ALT: 20 U/L (ref 0–44)
AST: 19 U/L (ref 15–41)
Albumin: 3.4 g/dL — ABNORMAL LOW (ref 3.5–5.0)
Alkaline Phosphatase: 72 U/L (ref 38–126)
Anion gap: 7 (ref 5–15)
BUN: 16 mg/dL (ref 6–20)
CO2: 23 mmol/L (ref 22–32)
Calcium: 8.9 mg/dL (ref 8.9–10.3)
Chloride: 111 mmol/L (ref 98–111)
Creatinine, Ser: 0.64 mg/dL (ref 0.44–1.00)
GFR, Estimated: >60 mL/min (ref >60)
Glucose, Bld: 110 mg/dL — ABNORMAL HIGH (ref 70–99)
Potassium: 3.8 mmol/L (ref 3.5–5.1)
Sodium: 141 mmol/L (ref 135–145)
Total Bilirubin: 0.4 mg/dL (ref 0.3–1.2)
Total Protein: 7.3 g/dL (ref 6.5–8.1)

## 2022-07-31 LAB — LIPASE, BLOOD: Lipase: 33 U/L (ref 11–51)

## 2022-07-31 LAB — CBC WITH DIFFERENTIAL/PLATELET
Abs Immature Granulocytes: 0.03 10*3/uL (ref 0.00–0.07)
Basophils Absolute: 0.1 10*3/uL (ref 0.0–0.1)
Basophils Relative: 1 %
Eosinophils Absolute: 0.4 10*3/uL (ref 0.0–0.5)
Eosinophils Relative: 5 %
HCT: 38.8 % (ref 36.0–46.0)
Hemoglobin: 12.5 g/dL (ref 12.0–15.0)
Immature Granulocytes: 0 %
Lymphocytes Relative: 33 %
Lymphs Abs: 2.4 10*3/uL (ref 0.7–4.0)
MCH: 33.2 pg (ref 26.0–34.0)
MCHC: 32.2 g/dL (ref 30.0–36.0)
MCV: 102.9 fL — ABNORMAL HIGH (ref 80.0–100.0)
Monocytes Absolute: 0.7 10*3/uL (ref 0.1–1.0)
Monocytes Relative: 9 %
Neutro Abs: 3.7 10*3/uL (ref 1.7–7.7)
Neutrophils Relative %: 52 %
Platelets: 348 10*3/uL (ref 150–400)
RBC: 3.77 MIL/uL — ABNORMAL LOW (ref 3.87–5.11)
RDW: 13.4 % (ref 11.5–15.5)
WBC: 7.2 10*3/uL (ref 4.0–10.5)
nRBC: 0 % (ref 0.0–0.2)

## 2022-07-31 LAB — SARS CORONAVIRUS 2 BY RT PCR: SARS Coronavirus 2 by RT PCR: NEGATIVE

## 2022-07-31 MED ORDER — HYDROMORPHONE HCL 1 MG/ML IJ SOLN
1.0000 mg | Freq: Once | INTRAMUSCULAR | Status: AC
  Administered 2022-07-31: 1 mg via INTRAVENOUS
  Filled 2022-07-31 (×2): qty 1

## 2022-07-31 MED ORDER — HYDROMORPHONE HCL 1 MG/ML IJ SOLN
1.0000 mg | Freq: Once | INTRAMUSCULAR | Status: AC
  Administered 2022-07-31: 1 mg via INTRAVENOUS
  Filled 2022-07-31: qty 1

## 2022-07-31 MED ORDER — IOHEXOL 350 MG/ML SOLN
75.0000 mL | Freq: Once | INTRAVENOUS | Status: AC | PRN
  Administered 2022-07-31: 75 mL via INTRAVENOUS

## 2022-07-31 MED ORDER — LABETALOL HCL 5 MG/ML IV SOLN
10.0000 mg | Freq: Once | INTRAVENOUS | Status: AC
  Administered 2022-07-31: 10 mg via INTRAVENOUS
  Filled 2022-07-31: qty 4

## 2022-07-31 MED ORDER — PROCHLORPERAZINE EDISYLATE 10 MG/2ML IJ SOLN
10.0000 mg | Freq: Once | INTRAMUSCULAR | Status: AC
  Administered 2022-07-31: 10 mg via INTRAVENOUS
  Filled 2022-07-31: qty 2

## 2022-07-31 MED ORDER — HYDROCHLOROTHIAZIDE 25 MG PO TABS: 25.0000 mg | ORAL_TABLET | Freq: Every day | ORAL | 0 refills | Status: DC

## 2022-07-31 MED ORDER — HYDROCHLOROTHIAZIDE 25 MG PO TABS
25.0000 mg | ORAL_TABLET | Freq: Every day | ORAL | Status: DC
  Administered 2022-07-31: 25 mg via ORAL
  Filled 2022-07-31: qty 1

## 2022-07-31 MED ORDER — DIPHENHYDRAMINE HCL 50 MG/ML IJ SOLN
50.0000 mg | Freq: Once | INTRAMUSCULAR | Status: AC
  Administered 2022-07-31: 50 mg via INTRAVENOUS
  Filled 2022-07-31: qty 1

## 2022-07-31 NOTE — ED Triage Notes (Signed)
Pt from home with EMS for several concerns. Initially called ems for sob that woke her from her sleep. Has had headache x 2 weeks, uncontrolled hypertension (reports not having her blood pressure medications for a while); hx of aneurysm with craniotomy, has R sided weakness at baseline. Pt c/o bilateral swelling to her hands, worsening swelling to her R leg. No new neuro deficits on exam. Weak grips, no drift in extremities.

## 2022-07-31 NOTE — ED Notes (Signed)
Assisted to bathroom  attempting to get urine spec.

## 2022-07-31 NOTE — Discharge Instructions (Signed)
Your history and exam today led [place] to do imaging of your head and neck which were overall reassuring and similar to prior.  Your imaging of your abdomen pelvis also did not show any new concerning findings.  As your symptoms have improved after medications we do feel you are safe for discharge home.  I do suspect your blood pressure has been contributing to all of your symptoms so please take your blood pressure medicine again.  Please follow-up with your primary doctor and get reestablished.  If any symptoms change or worsen acutely, please return to the nearest emergency department.

## 2022-07-31 NOTE — ED Provider Notes (Signed)
[facility] Provider Note   CSN: [id number] Arrival date & time: 07/31/22  0354     History  Chief Complaint  Patient presents with   Shortness of Breath   Headache   Abdominal Pain    Sierra Atkins is a 56 y.o. female.  The history is provided by the patient and medical records. No language interpreter was used.  Shortness of Breath Severity:  Moderate Onset quality:  Gradual Duration:  1 week Timing:  Intermittent Progression:  Waxing and waning Chronicity:  Recurrent Context: URI   Worsened by:  Nothing Ineffective treatments:  None tried Associated symptoms: abdominal pain and headaches   Associated symptoms: no chest pain, no cough, no diaphoresis, no fever, no neck pain, no sputum production, no vomiting and no wheezing   Headache Pain location:  Frontal Quality:  Dull Radiates to:  Does not radiate Severity currently:  Unable to specify Severity at highest:  Unable to specify Onset quality:  Gradual Duration:  1 week Timing:  Intermittent Progression:  Waxing and waning Chronicity:  Recurrent Similar to prior headaches: yes   Relieved by:  Nothing Worsened by:  Nothing Associated symptoms: abdominal pain, back pain (cva), fatigue, nausea, near-syncope (lightheaded) and visual change (resolved)   Associated symptoms: no congestion, no cough, no diarrhea, no fever, no neck pain, no neck stiffness, no numbness, no paresthesias, no photophobia, no seizures, no vomiting and no weakness (at baseline)   Abdominal Pain Pain location:  L flank Pain quality: aching   Pain radiates to:  Does not radiate Pain severity:  Moderate Timing:  Intermittent Progression:  Waxing and waning Chronicity:  New Relieved by:  Nothing Worsened by:  Nothing Ineffective treatments:  None tried Associated symptoms: fatigue, nausea and shortness of breath   Associated symptoms: no chest pain, no chills, no constipation, no cough, no  diarrhea, no dysuria, no fever, no vaginal bleeding, no vaginal discharge and no vomiting        Home Medications Prior to Admission medications   Medication Sig Start Date End Date Taking? Authorizing Provider  hydrochlorothiazide (HYDRODIURIL) 25 MG tablet Take 1 tablet (25 mg total) by mouth daily for 14 days. 08/12/19 08/26/19  Lorin Glass, PA-C  hydroxychloroquine (PLAQUENIL) 200 MG tablet Take by mouth 2 (two) times daily.      [provider]  Multiple Vitamin (MULTIVITAMIN) capsule Take 1 capsule by mouth daily.      [provider]  Omega-3 Fatty Acids (FISH OIL) 1000 MG CAPS Take 2 capsules by mouth daily.    [provider]  Oxycodone HCl 10 MG TABS Take 1 tablet (10 mg total) by mouth 3 (three) times daily. Patient not taking: Reported on 08/12/2019 10/24/11   Junius Creamer, NP  oxyCODONE-acetaminophen (PERCOCET/ROXICET) 5-325 MG tablet Take 1 tablet by mouth every 6 (six) hours as needed for severe pain. 08/12/19   Lorin Glass, PA-C  predniSONE (DELTASONE) 20 MG tablet Take 20 mg by mouth daily with breakfast.    [provider]  Probiotic Product (PROBIOTIC PO) Take 1 capsule by mouth daily.    [provider]  zolpidem (AMBIEN) 10 MG tablet Take 10 mg by mouth at bedtime as needed for sleep. For sleep.    [provider]      Allergies    Codeine and Morphine and related    Review of Systems   Review of Systems  Constitutional:  Positive for fatigue. Negative for chills, diaphoresis and  fever.  HENT:  Negative for congestion.   Eyes:  Negative for photophobia.  Respiratory:  Positive for shortness of breath. Negative for cough, sputum production, chest tightness and wheezing.   Cardiovascular:  Positive for near-syncope (lightheaded). Negative for chest pain and palpitations.  Gastrointestinal:  Positive for abdominal pain and nausea. Negative for constipation, diarrhea and vomiting.  Genitourinary:   Positive for flank pain and frequency. Negative for dysuria, vaginal bleeding and vaginal discharge.  Musculoskeletal:  Positive for back pain (cva). Negative for neck pain and neck stiffness.  Skin:  Negative for wound.  Neurological:  Positive for light-headedness and headaches. Negative for seizures, weakness (at baseline), numbness and paresthesias.  Psychiatric/Behavioral:  Negative for agitation and confusion.   All other systems reviewed and are negative.   Physical Exam Updated Vital Signs BP (!) 168/125 (BP Location: Left Arm)   Pulse 77   Temp 98.6 F (37 C)   Resp 20   SpO2 100%  Physical Exam Vitals and nursing note reviewed.  Constitutional:      General: She is not in acute distress.    Appearance: She is well-developed. She is obese. She is not ill-appearing, toxic-appearing or diaphoretic.  HENT:     Head: Normocephalic and atraumatic.     Mouth/Throat:     Mouth: Mucous membranes are moist.     Pharynx: No oropharyngeal exudate or posterior oropharyngeal erythema.  Eyes:     Extraocular Movements: Extraocular movements intact.     Conjunctiva/sclera: Conjunctivae normal.     Pupils: Pupils are equal, round, and reactive to light.  Cardiovascular:     Rate and Rhythm: Normal rate and regular rhythm.     Heart sounds: No murmur heard. Pulmonary:     Effort: Pulmonary effort is normal. No respiratory distress.     Breath sounds: Normal breath sounds. No wheezing, rhonchi or rales.  Chest:     Chest wall: No tenderness.  Abdominal:     General: Abdomen is flat.     Palpations: Abdomen is soft.     Tenderness: There is no abdominal tenderness. There is left CVA tenderness. There is no right CVA tenderness, guarding or rebound.  Musculoskeletal:        General: No swelling.     Cervical back: Neck supple. No tenderness.     Right lower leg: No tenderness. Edema present.     Left lower leg: No tenderness. Edema present.  Skin:    General: Skin is warm and  dry.     Capillary Refill: Capillary refill takes less than 2 seconds.     Findings: No erythema.  Neurological:     Mental Status: She is alert. Mental status is at baseline.  Psychiatric:        Mood and Affect: Mood normal.     ED Results / Procedures / Treatments   Labs (all labs ordered are listed, but only abnormal results are displayed) Labs Reviewed  CBC WITH DIFFERENTIAL/PLATELET - Abnormal; Notable for the following components:      Result Value   RBC 3.77 (*)    MCV 102.9 (*)    All other components within normal limits  COMPREHENSIVE METABOLIC PANEL - Abnormal; Notable for the following components:   Glucose, Bld 110 (*)    Albumin 3.4 (*)    All other components within normal limits  URINALYSIS, ROUTINE W REFLEX MICROSCOPIC - Abnormal; Notable for the following components:   Specific Gravity, Urine >1.046 (*)    Leukocytes,Ua  TRACE (*)    All other components within normal limits  SARS CORONAVIRUS 2 BY RT PCR  URINE CULTURE  LIPASE, BLOOD  TROPONIN I (HIGH SENSITIVITY)  TROPONIN I (HIGH SENSITIVITY)    EKG EKG Interpretation  Date/Time:  Friday July 31 2022 03:54:14 EDT Ventricular Rate:  78 PR Interval:  160 QRS Duration: 84 QT Interval:  398 QTC Calculation: 453 R Axis:   1 Text Interpretation: Normal sinus rhythm Cannot rule out Anterior infarct , age undetermined Abnormal ECG No previous ECGs available Confirmed by Merrily Pew [phone]) on 07/31/2022 4:02:17 AM  Radiology CT Renal Stone Study  Result Date: 07/31/2022 CLINICAL DATA:  Flank pain. Kidney stone suspected. Laterality not specified. EXAM: CT ABDOMEN AND PELVIS WITHOUT CONTRAST TECHNIQUE: Multidetector CT imaging of the abdomen and pelvis was performed following the standard protocol without IV contrast. RADIATION DOSE REDUCTION: This exam was performed according to the departmental dose-optimization program which includes automated exposure control, adjustment of the mA and/or kV  according to patient size and/or use of iterative reconstruction technique. COMPARISON:  Limited correlation made with hip radiograph 08/11/2019. FINDINGS: Note: Patient received intravenous contrast approximately 90 minutes earlier for CT angiography of the head and neck. Additional contrast was not administered for this examination. This limits parenchymal organ assessment, especially for the renal collecting systems. Lower chest: Clear lung bases. No significant pleural or pericardial effusion. Hepatobiliary: The liver is normal in density without suspicious focal abnormality. No evidence of gallstones, gallbladder wall thickening or biliary dilatation. Pancreas: Unremarkable. No pancreatic ductal dilatation or surrounding inflammatory changes. Spleen: Normal in size without focal abnormality. Adrenals/Urinary Tract: Both adrenal glands appear normal. There is contrast within the renal collecting systems, ureters and bladder from the CTA performed earlier. This substantially limits assessment for urinary tract calculi. No definite calculi are identified. There is upper pole caliectasis on the left without associated perinephric soft tissue stranding or focal renal abnormality. The bladder appears unremarkable for its degree of distention. Stomach/Bowel: No enteric contrast administered. The stomach appears unremarkable for its degree of distension. No evidence of bowel wall thickening, distention or surrounding inflammatory change. The appendix appears normal. Moderate stool throughout the colon. Vascular/Lymphatic: There are no enlarged abdominal or pelvic lymph nodes. Aortic and branch vessel atherosclerosis without evidence of aneurysm. Reproductive: Hysterectomy.  No adnexal mass. Other: No evidence of abdominal wall mass or hernia. No ascites. Musculoskeletal: No acute osseous findings. Severe bilateral hip osteoarthritis again noted. There is multilevel lumbar spondylosis, most advanced at L3-4 where there  is suspected moderate multifactorial spinal stenosis. IMPRESSION: 1. Examination is limited by the administration of contrast for earlier CTA. This contrast may obscure small urinary tract calculi. 2. Moderate caliectasis in the upper pole of the left kidney without clear etiology. This could be secondary to postinflammatory scarring. No ureteral dilatation or secondary signs of collecting system obstruction. 3. No definite acute findings. 4. Severe bilateral hip arthropathy with multilevel lumbar spondylosis. 5.  Aortic Atherosclerosis (ICD10-I70.0). Electronically Signed   By: Richardean Sale M.D.   On: 07/31/2022 08:41   CT ANGIO HEAD NECK W WO CM  Result Date: 07/31/2022 CLINICAL DATA:  Cerebral aneurysm, untreated. Shortness of breath. Headache for 2 weeks with hypertension EXAM: CT ANGIOGRAPHY HEAD AND NECK TECHNIQUE: Multidetector CT imaging of the head and neck was performed using the standard protocol during bolus administration of intravenous contrast. Multiplanar CT image reconstructions and MIPs were obtained to evaluate the vascular anatomy. Carotid stenosis measurements (when applicable) are obtained utilizing NASCET  criteria, using the distal internal carotid diameter as the denominator. RADIATION DOSE REDUCTION: This exam was performed according to the departmental dose-optimization program which includes automated exposure control, adjustment of the mA and/or kV according to patient size and/or use of iterative reconstruction technique. CONTRAST:  58m OMNIPAQUE IOHEXOL 350 MG/ML SOLN COMPARISON:  None available FINDINGS: CT HEAD FINDINGS Brain: Remote suboccipital craniectomy. Right occipital shunt with tip at the lower left thalamus. No hemorrhage, hydrocephalus, or collection. Vascular: See below Skull: Suboccipital craniectomy Sinuses/Orbits: No acute finding. Review of the MIP images confirms the above findings CTA NECK FINDINGS Aortic arch: Atheromatous plaque with 3 vessel branching. Right  carotid system: Mild atheromatous plaque. No stenosis or ulceration Left carotid system: Mild atheromatous plaque. No stenosis or ulceration Vertebral arteries: No proximal subclavian stenosis. Codominant vertebral arteries that are smoothly contoured and widely patent to the dura. Skeleton: Generalized cervical spine degeneration. Other neck: No acute finding. Tonsillar thickening. Symmetric generous sized lymph nodes in the neck which are nonspecific. Upper chest: Small area of air trapping at the left apex. Review of the MIP images confirms the above findings CTA HEAD FINDINGS Anterior circulation: Major vessels are widely patent and smoothly contoured. Mild calcified plaque on the carotid siphons. Negative for aneurysm. Posterior circulation: Metallic density near the proximal right PICA for prior aneurysm treatment. Vertebral/basilar arteries and their major branches are widely patent. No evidence of untreated aneurysm or vascular malformation. Venous sinuses: Diffusely patent Anatomic variants: None significant Review of the MIP images confirms the above findings IMPRESSION: No acute vascular finding or flow limiting stenosis. No new or recurrent aneurysm seen. Shunt catheter with normal ventricular size. Tonsillar and cervical nodal thickening, please correlate for URI. Electronically Signed   By: JJorje GuildM.D.   On: 07/31/2022 07:11   DG Chest 2 View  Result Date: 07/31/2022 CLINICAL DATA:  Shortness of breath. EXAM: CHEST - 2 VIEW COMPARISON:  Portable chest 07/15/2010 FINDINGS: The heart is moderately enlarged. Central vessels are normal caliber. No edema is seen. The mediastinum is unchanged in appearance with aortic tortuosity with calcific plaques in the arch. The lungs are generally clear with again noted slightly elevated left hemidiaphragm. No pleural effusion is seen.  There is thoracic spondylosis. IMPRESSION: No evidence of acute chest process. Stable chest with cardiomegaly. Aortic  atherosclerosis. Electronically Signed   By: KTelford NabM.D.   On: 07/31/2022 05:16    Procedures Procedures    Medications Ordered in ED Medications  hydrochlorothiazide (HYDRODIURIL) tablet 25 mg (25 mg Oral Given 07/31/22 0801)  iohexol (OMNIPAQUE) 350 MG/ML injection 75 mL (75 mLs Intravenous Contrast Given 07/31/22 0658)  HYDROmorphone (DILAUDID) injection 1 mg (1 mg Intravenous Given 07/31/22 0739)  prochlorperazine (COMPAZINE) injection 10 mg (10 mg Intravenous Given 07/31/22 0738)  diphenhydrAMINE (BENADRYL) injection 50 mg (50 mg Intravenous Given 07/31/22 0740)  labetalol (NORMODYNE) injection 10 mg (10 mg Intravenous Given 07/31/22 0802)  HYDROmorphone (DILAUDID) injection 1 mg (1 mg Intravenous Given 07/31/22 1317)    ED Course/ Medical Decision Making/ A&P                           Medical Decision Making Amount and/or Complexity of Data Reviewed Labs: ordered. Radiology: ordered.  Risk Prescription drug management.    VHanan Mcwilliamsis a 56y.o. female with a complex past medical history including lupus, previous VP shunt for intracranial hypertension, hysterectomy, chronic headaches, hypertension, kidney stones, and previous urinary tract infections  who presents with 1 week of elevated blood pressures, headaches, shortness of breath with some URI symptoms, left flank pain, and urinary frequency.  Patient is concerned that she could have a URI but is also concerned about her head given her history of aneurysms and intracranial bleeding.  She reports that she has had moderate to severe headaches that are waxed and waned.  She denies any trauma.  She reports no actual fevers or chills and denies any chest pain, palpitations, or abdominal pain anteriorly.  She reports he had the left flank and left CVA area discomfort that feels similar when she had either kidney infection or kidney stone in the past.  She reports her urine has been darker and more frequent.  Denies dysuria however.   Does not report any other vaginal complaints.  Denies constipation or diarrhea or any rectal bleeding.  Denies any new extremity symptoms.  She reports that she has some chronic weakness on the right side of her body due to the previous intracranial bleed.  She said that she felt lightheaded in the setting of her headaches and felt fatigued all over.  On exam, lungs are clear and chest is nontender.  Abdomen is nontender.  Left flank and left CVA area are tender to palpation.   Bowel sounds were appreciated.  Patient does have some subtle right arm and right leg weakness compared to left which she reports is unchanged from baseline.  Pupils are symmetric and reactive normal extraocular movements.  No carotid bruits appreciated.  She can move all of her extremities and her neck around.  No back or neck tenderness in the upper areas.  Exam otherwise unremarkable.  Clinically I suspect that patient's blood pressure has been elevated as she reports has not been on medicine for "some time".  We will give her some IV blood pressure medicine and her home medicine of HCTZ.  We will also check for kidney stone and UTI/Pilo with urinalysis and a stone study.  She had imaging of her head and neck from triage given her history of intracranial abnormalities and aneurysms and the imaging was reassuring.  Did show some lymph nodes that could be associated with an upper respiratory infection which I do suspect patient could have.  We will check for COVID.  Chest x-ray did not show pneumonia.  Otherwise labs will be assessed.  We will also give her headache cocktail and some pain medicine for the suspected kidney stone.  If work-up is reassuring, anticipate discharge home with reestablishment with PCP and follow-up.  1:07 PM Work-up returned overall reassuring.  Patient is feeling better.  We went through all the images and labs together.  I suspect she has some chronic pain exacerbated by URI symptoms.  There was no  evidence of kidney stone or evidence of UTI/Pilo.  Patient felt better after pain medicine and headache cocktail.  We feel she is safe for discharge home.  She will follow-up with PCP and we will order a prescription for home blood pressure medicine as it has improved from arrival.  Patient agrees with this plan and was discharged with improved symptoms.         Final Clinical Impression(s) / ED Diagnoses Final diagnoses:  Acute nonintractable headache, unspecified headache type  Left flank pain  Elevated blood pressure reading    Rx / DC Orders ED Discharge Orders          Ordered    hydrochlorothiazide (HYDRODIURIL) 25 MG tablet  Daily  07/31/22 1319            Clinical Impression: 1. Acute nonintractable headache, unspecified headache type   2. Left flank pain   3. Elevated blood pressure reading     Disposition: Discharge  Condition: Good  I have discussed the results, Dx and Tx plan with the pt(& family if present). He/she/they expressed understanding and agree(s) with the plan. Discharge instructions discussed at great length. Strict return precautions discussed and pt &/or family have verbalized understanding of the instructions. No further questions at time of discharge.    New Prescriptions   HYDROCHLOROTHIAZIDE (HYDRODIURIL) 25 MG TABLET    Take 1 tablet (25 mg total) by mouth daily.    Follow Up: [place] [id number] [phone] Schedule an appointment as soon as possible for a visit    [place] [address] [id number] m[place]        Kalese Ensz, Gwenyth Allegra, MD 07/31/22 1320

## 2022-08-01 LAB — URINE CULTURE

## 2022-08-13 ENCOUNTER — Other Ambulatory Visit: Payer: Self-pay

## 2022-08-13 ENCOUNTER — Encounter (HOSPITAL_BASED_OUTPATIENT_CLINIC_OR_DEPARTMENT_OTHER): Payer: Self-pay | Admitting: Emergency Medicine

## 2022-08-13 ENCOUNTER — Emergency Department (HOSPITAL_BASED_OUTPATIENT_CLINIC_OR_DEPARTMENT_OTHER): Payer: Medicaid Other

## 2022-08-13 ENCOUNTER — Emergency Department (HOSPITAL_BASED_OUTPATIENT_CLINIC_OR_DEPARTMENT_OTHER)
Admission: EM | Admit: 2022-08-13 | Discharge: 2022-08-13 | Disposition: A | Discharge status: Home / Self Care | Payer: Medicaid Other | Attending: Emergency Medicine | Admitting: Emergency Medicine

## 2022-08-13 DIAGNOSIS — Z20822 Contact with and (suspected) exposure to covid-19: Secondary | ICD-10-CM | POA: Insufficient documentation

## 2022-08-13 DIAGNOSIS — E876 Hypokalemia: Secondary | ICD-10-CM | POA: Insufficient documentation

## 2022-08-13 DIAGNOSIS — R0602 Shortness of breath: Secondary | ICD-10-CM | POA: Insufficient documentation

## 2022-08-13 DIAGNOSIS — R197 Diarrhea, unspecified: Secondary | ICD-10-CM | POA: Insufficient documentation

## 2022-08-13 LAB — BASIC METABOLIC PANEL
Anion gap: 8 (ref 5–15)
BUN: 12 mg/dL (ref 6–20)
CO2: 26 mmol/L (ref 22–32)
Calcium: 8.6 mg/dL — ABNORMAL LOW (ref 8.9–10.3)
Chloride: 104 mmol/L (ref 98–111)
Creatinine, Ser: 0.65 mg/dL (ref 0.44–1.00)
GFR, Estimated: >60 mL/min (ref >60)
Glucose, Bld: 101 mg/dL — ABNORMAL HIGH (ref 70–99)
Potassium: 2.9 mmol/L — ABNORMAL LOW (ref 3.5–5.1)
Sodium: 138 mmol/L (ref 135–145)

## 2022-08-13 LAB — CBC
HCT: 36.6 % (ref 36.0–46.0)
Hemoglobin: 12.4 g/dL (ref 12.0–15.0)
MCH: 32.6 pg (ref 26.0–34.0)
MCHC: 33.9 g/dL (ref 30.0–36.0)
MCV: 96.3 fL (ref 80.0–100.0)
Platelets: 372 10*3/uL (ref 150–400)
RBC: 3.8 MIL/uL — ABNORMAL LOW (ref 3.87–5.11)
RDW: 13.2 % (ref 11.5–15.5)
WBC: 7.1 10*3/uL (ref 4.0–10.5)
nRBC: 0 % (ref 0.0–0.2)

## 2022-08-13 LAB — TROPONIN I (HIGH SENSITIVITY): Troponin I (High Sensitivity): 13 ng/L (ref <18)

## 2022-08-13 LAB — RESP PANEL BY RT-PCR (FLU A&B, COVID) ARPGX2
Influenza A by PCR: NEGATIVE
Influenza B by PCR: NEGATIVE
SARS Coronavirus 2 by RT PCR: NEGATIVE

## 2022-08-13 MED ORDER — LOPERAMIDE HCL 2 MG PO CAPS: 2.0000 mg | ORAL_CAPSULE | Freq: Four times a day (QID) | ORAL | 0 refills | Status: DC | PRN

## 2022-08-13 MED ORDER — POTASSIUM CHLORIDE CRYS ER 20 MEQ PO TBCR: 20.0000 meq | EXTENDED_RELEASE_TABLET | Freq: Every day | ORAL | 0 refills | Status: DC

## 2022-08-13 MED ORDER — POTASSIUM CHLORIDE 10 MEQ/100ML IV SOLN
10.0000 meq | INTRAVENOUS | Status: AC
  Administered 2022-08-13 (×2): 10 meq via INTRAVENOUS
  Filled 2022-08-13: qty 100

## 2022-08-13 MED ORDER — HYDROMORPHONE HCL 1 MG/ML IJ SOLN: 0.5000 mg | Freq: Once | INTRAMUSCULAR | Status: DC

## 2022-08-13 MED ORDER — LACTATED RINGERS IV BOLUS
1000.0000 mL | Freq: Once | INTRAVENOUS | Status: AC
  Administered 2022-08-13: 1000 mL via INTRAVENOUS

## 2022-08-13 MED ORDER — POTASSIUM CHLORIDE CRYS ER 20 MEQ PO TBCR
40.0000 meq | EXTENDED_RELEASE_TABLET | Freq: Once | ORAL | Status: AC
  Administered 2022-08-13: 40 meq via ORAL
  Filled 2022-08-13: qty 2

## 2022-08-13 MED ORDER — ONDANSETRON HCL 4 MG/2ML IJ SOLN
4.0000 mg | Freq: Once | INTRAMUSCULAR | Status: AC
  Administered 2022-08-13: 4 mg via INTRAVENOUS
  Filled 2022-08-13: qty 2

## 2022-08-13 MED ORDER — HYDROMORPHONE HCL 1 MG/ML IJ SOLN
1.0000 mg | Freq: Once | INTRAMUSCULAR | Status: AC
  Administered 2022-08-13: 1 mg via INTRAVENOUS
  Filled 2022-08-13: qty 1

## 2022-08-13 MED ORDER — MAGNESIUM SULFATE IN D5W 1-5 GM/100ML-% IV SOLN
1.0000 g | Freq: Once | INTRAVENOUS | Status: AC
  Administered 2022-08-13: 1 g via INTRAVENOUS
  Filled 2022-08-13: qty 100

## 2022-08-13 MED ORDER — KETOROLAC TROMETHAMINE 15 MG/ML IJ SOLN
30.0000 mg | Freq: Once | INTRAMUSCULAR | Status: DC
  Filled 2022-08-13: qty 2

## 2022-08-13 NOTE — ED Provider Notes (Signed)
[facility] EMERGENCY DEPARTMENT Provider Note   CSN: [id number] Arrival date & time: 08/13/22  1706     History  Chief Complaint  Patient presents with   Shortness of Breath    Sierra Atkins is a 56 y.o. female.   Shortness of Breath  Patient is a 56 year old female with a past medical history significant for arthritis, back pain, lupus, subarachnoid hemorrhage with VR shunt.  She presents emergency room today with complaints of abdominal pain, myalgias, diffuse headaches, shortness of breath, diarrhea.  She states that her diarrhea has been severe and watery and that if she eats anything goes "right through me."  She denies any chest pain but does endorse shortness of breath during triage.  07/31/22 - Headache - and lymphadenopathy at that time too. Reassuring workup.  CT angio contrast study head and neck NML  05/18/22 - NML CT AP ([facility])      Home Medications Prior to Admission medications   Medication Sig Start Date End Date Taking? Authorizing Provider  loperamide (IMODIUM) 2 MG capsule Take 1 capsule (2 mg total) by mouth 4 (four) times daily as needed for diarrhea or loose stools. 08/13/22  Yes Darril Patriarca S, PA  potassium chloride SA (KLOR-CON M) 20 MEQ tablet Take 1 tablet (20 mEq total) by mouth daily for 6 days. 08/13/22 08/19/22 Yes Jess Sulak S, PA  hydrochlorothiazide (HYDRODIURIL) 25 MG tablet Take 1 tablet (25 mg total) by mouth daily for 14 days. 08/12/19 08/26/19  Lorin Glass, PA-C  hydrochlorothiazide (HYDRODIURIL) 25 MG tablet Take 1 tablet (25 mg total) by mouth daily. 07/31/22   Tegeler, Gwenyth Allegra, MD  hydroxychloroquine (PLAQUENIL) 200 MG tablet Take by mouth 2 (two) times daily.      [provider]  Multiple Vitamin (MULTIVITAMIN) capsule Take 1 capsule by mouth daily.      [provider]  Omega-3 Fatty Acids (FISH OIL) 1000 MG CAPS Take 2 capsules by mouth daily.    [provider]  Oxycodone HCl  10 MG TABS Take 1 tablet (10 mg total) by mouth 3 (three) times daily. Patient not taking: Reported on 08/12/2019 10/24/11   Junius Creamer, NP  oxyCODONE-acetaminophen (PERCOCET/ROXICET) 5-325 MG tablet Take 1 tablet by mouth every 6 (six) hours as needed for severe pain. 08/12/19   Lorin Glass, PA-C  predniSONE (DELTASONE) 20 MG tablet Take 20 mg by mouth daily with breakfast.    [provider]  Probiotic Product (PROBIOTIC PO) Take 1 capsule by mouth daily.    [provider]  zolpidem (AMBIEN) 10 MG tablet Take 10 mg by mouth at bedtime as needed for sleep. For sleep.    [provider]      Allergies    Fentanyl, Codeine, and Morphine and related    Review of Systems   Review of Systems  Respiratory:  Positive for shortness of breath.     Physical Exam Updated Vital Signs BP (!) 177/98   Pulse 71   Temp 98.9 F (37.2 C) (Oral)   Resp 18   Ht '5\' 7"'$  (1.702 m)   Wt 117.9 kg   SpO2 91%   BMI 40.72 kg/m  Physical Exam Vitals and nursing note reviewed.  Constitutional:      General: She is not in acute distress.    Comments: Non-toxic appearing 56 year old.  In no acute distress.  Able answer questions appropriately follow commands. No increased work of breathing. Speaking in full sentences.   HENT:  Head: Normocephalic and atraumatic.     Nose: Nose normal.     Mouth/Throat:     Mouth: Mucous membranes are dry.  Eyes:     General: No scleral icterus. Cardiovascular:     Rate and Rhythm: Normal rate and regular rhythm.     Pulses: Normal pulses.     Heart sounds: Normal heart sounds.  Pulmonary:     Effort: Pulmonary effort is normal. No respiratory distress.     Breath sounds: Normal breath sounds. No wheezing.     Comments: Poor effort Abdominal:     Palpations: Abdomen is soft.     Tenderness: There is no abdominal tenderness. There is no guarding or rebound.  Musculoskeletal:     Cervical back: Normal range of motion.      Right lower leg: No edema.     Left lower leg: No edema.  Skin:    General: Skin is warm and dry.     Capillary Refill: Capillary refill takes less than 2 seconds.  Neurological:     Mental Status: She is alert. Mental status is at baseline.  Psychiatric:        Mood and Affect: Mood normal.        Behavior: Behavior normal.     ED Results / Procedures / Treatments   Labs (all labs ordered are listed, but only abnormal results are displayed) Labs Reviewed  BASIC METABOLIC PANEL - Abnormal; Notable for the following components:      Result Value   Potassium 2.9 (*)    Glucose, Bld 101 (*)    Calcium 8.6 (*)    All other components within normal limits  CBC - Abnormal; Notable for the following components:   RBC 3.80 (*)    All other components within normal limits  RESP PANEL BY RT-PCR (FLU A&B, COVID) ARPGX2  TROPONIN I (HIGH SENSITIVITY)    EKG EKG Interpretation  Date/Time:  Thursday August 13 2022 17:22:51 EDT Ventricular Rate:  90 PR Interval:  150 QRS Duration: 90 QT Interval:  430 QTC Calculation: 526 R Axis:   4 Text Interpretation: Normal sinus rhythm Moderate voltage criteria for LVH, may be normal variant ( R in aVL , Cornell product ) Cannot rule out Anterior infarct , age undetermined Prolonged QT Abnormal ECG When compared with ECG of 31-Jul-2022 03:54, prolonged QT new since previous Confirmed by Wandra Arthurs [id number]) on 08/13/2022 6:42:25 PM  Radiology DG Chest 2 View  Result Date: 08/13/2022 CLINICAL DATA:  Shortness of breath. EXAM: CHEST - 2 VIEW COMPARISON:  July 31, 2022. FINDINGS: Stable cardiomediastinal silhouette. Lungs are clear. Bony thorax is unremarkable. IMPRESSION: No active cardiopulmonary disease. Electronically Signed   By: Marijo Conception M.D.   On: 08/13/2022 17:40    Procedures Procedures    Medications Ordered in ED Medications  potassium chloride 10 mEq in 100 mL IVPB (10 mEq Intravenous New Bag/Given 08/13/22 2042)   potassium chloride SA (KLOR-CON M) CR tablet 40 mEq (40 mEq Oral Given 08/13/22 1827)  lactated ringers bolus 1,000 mL (0 mLs Intravenous Stopped 08/13/22 1914)  ondansetron (ZOFRAN) injection 4 mg (4 mg Intravenous Given 08/13/22 1827)  magnesium sulfate IVPB 1 g 100 mL (0 g Intravenous Stopped 08/13/22 1949)  HYDROmorphone (DILAUDID) injection 1 mg (1 mg Intravenous Given 08/13/22 1911)    ED Course/ Medical Decision Making/ A&P  Medical Decision Making Amount and/or Complexity of Data Reviewed Labs: ordered. Radiology: ordered.  Risk Prescription drug management.   This patient presents to the ED for concern of various symptoms including diarrhea, fatigue, abdominal cramping, lymph nodes that are swollen in her neck, headache--after further questioning it seems that diarrhea is a prime issue that is bothering her today this involves a number of treatment options, and is a complaint that carries with it a moderate risk of complications and morbidity.      Co morbidities: Discussed in HPI   Brief History:  Patient is a 56 year old female with a past medical history significant for arthritis, back pain, lupus, subarachnoid hemorrhage with VR shunt.  She presents emergency room today with complaints of abdominal pain, myalgias, diffuse headaches, shortness of breath, diarrhea.  She states that her diarrhea has been severe and watery and that if she eats anything goes "right through me."  She denies any chest pain but does endorse shortness of breath during triage.  07/31/22 - Headache - and lymphadenopathy at that time too. Reassuring workup.  CT angio contrast study head and neck NML  05/18/22 - NML CT AP ([facility])   Physical exam unremarkable.  EMR reviewed including pt PMHx, past surgical history and past visits to ER.   See HPI for more details   Lab Tests:   I ordered and independently interpreted labs. Labs notable for potassium of 2.9 EKG with  normal QT prolonging.  Will provide with IV magnesium, LR, potassium supplementation   Imaging Studies:  NAD. I personally reviewed all imaging studies and no acute abnormality found. I agree with radiology interpretation.    Cardiac Monitoring:  The patient was maintained on a cardiac monitor.  I personally viewed and interpreted the cardiac monitored which showed an underlying rhythm of: NSR EKG non-ischemic mild QT prolongation   Medicines ordered:  I ordered medication including Dilaudid for pain, 1 g of magnesium IV, LR, Zofran, potassium supplementation Reevaluation of the patient after these medicines showed that the patient improved I have reviewed the patients home medicines and have made adjustments as needed   Critical Interventions:     Consults/Attending Physician   I discussed this case with my attending physician who cosigned this note including patient's presenting symptoms, physical exam, and planned diagnostics and interventions. Attending physician stated agreement with plan or made changes to plan which were implemented.   Reevaluation:  After the interventions noted above I re-evaluated patient and found that they have :improved   Social Determinants of Health:      Problem List / ED Course:  Chronic pain -patient forcefully requesting Dilaudid.  Seems very dissatisfied with my care when I questioned the specific request.  She states that she has allergies to fentanyl and morphine and did not want Toradol Tylenol or any alternative to Dilaudid.  Presentation certainly does have some red flags for behavior however she does not seem to have frequent visits to the emergency department.  I reviewed PDMP no active prescriptions for pain medicine. Diarrhea -hypokalemia likely secondary to diarrhea.  Given magnesium and potassium supplementation here.  Will discharge home with same Prolonged QT secondary to above.   Dispostion:  After consideration  of the diagnostic results and the patients response to treatment, I feel that the patent would benefit from close outpatient follow-up.   Final Clinical Impression(s) / ED Diagnoses Final diagnoses:  Hypokalemia  Diarrhea, unspecified type    Rx / DC Orders ED Discharge Orders  Ordered    loperamide (IMODIUM) 2 MG capsule  4 times daily PRN        08/13/22 2120    potassium chloride SA (KLOR-CON M) 20 MEQ tablet  Daily        08/13/22 2120              Tedd Sias, [place] 08/13/22 2148    Drenda Freeze, MD 08/13/22 [phone]

## 2022-08-13 NOTE — Discharge Instructions (Signed)
Take loperamide as prescribed for diarrhea.  Make sure you drink plenty of water, Pedialyte, and hydrate consistently  Take potassium as prescribed.  Follow-up with your primary care provider to have your potassium rechecked.

## 2022-08-13 NOTE — ED Triage Notes (Signed)
Shob, diarrhea associated with abdominal cramping, swollen lymph nodes, headache, and inability to tolerate po intake x 2 days. LS clear, diminished.

## 2023-05-25 ENCOUNTER — Emergency Department (HOSPITAL_BASED_OUTPATIENT_CLINIC_OR_DEPARTMENT_OTHER)
Admission: EM | Admit: 2023-05-25 | Discharge: 2023-05-25 | Disposition: A | Discharge status: Home / Self Care | Payer: Medicaid Other | Attending: Emergency Medicine | Admitting: Emergency Medicine

## 2023-05-25 ENCOUNTER — Other Ambulatory Visit: Payer: Self-pay

## 2023-05-25 ENCOUNTER — Encounter (HOSPITAL_BASED_OUTPATIENT_CLINIC_OR_DEPARTMENT_OTHER): Payer: Self-pay | Admitting: Urology

## 2023-05-25 DIAGNOSIS — R030 Elevated blood-pressure reading, without diagnosis of hypertension: Secondary | ICD-10-CM | POA: Diagnosis not present

## 2023-05-25 DIAGNOSIS — G8929 Other chronic pain: Secondary | ICD-10-CM

## 2023-05-25 DIAGNOSIS — N390 Urinary tract infection, site not specified: Secondary | ICD-10-CM

## 2023-05-25 DIAGNOSIS — M25551 Pain in right hip: Secondary | ICD-10-CM | POA: Insufficient documentation

## 2023-05-25 DIAGNOSIS — M25561 Pain in right knee: Secondary | ICD-10-CM | POA: Insufficient documentation

## 2023-05-25 DIAGNOSIS — M25552 Pain in left hip: Secondary | ICD-10-CM | POA: Insufficient documentation

## 2023-05-25 HISTORY — DX: Essential (primary) hypertension: I10

## 2023-05-25 LAB — URINALYSIS, ROUTINE W REFLEX MICROSCOPIC
Bilirubin Urine: NEGATIVE
Glucose, UA: NEGATIVE mg/dL
Ketones, ur: 15 mg/dL — AB
Nitrite: NEGATIVE
Protein, ur: 100 mg/dL — AB
Specific Gravity, Urine: >=1.03 (ref 1.005–1.030)
pH: 6 (ref 5.0–8.0)

## 2023-05-25 LAB — URINALYSIS, MICROSCOPIC (REFLEX): WBC, UA: >50 WBC/hpf (ref 0–5)

## 2023-05-25 MED ORDER — CEFTRIAXONE SODIUM 1 G IJ SOLR
1.0000 g | Freq: Once | INTRAMUSCULAR | Status: AC
  Administered 2023-05-25: 1 g via INTRAMUSCULAR
  Filled 2023-05-25: qty 10

## 2023-05-25 MED ORDER — HYDROCODONE-ACETAMINOPHEN 5-325 MG PO TABS: 1.0000 | ORAL_TABLET | Freq: Four times a day (QID) | ORAL | 0 refills | Status: DC | PRN

## 2023-05-25 MED ORDER — HYDROMORPHONE HCL 1 MG/ML IJ SOLN
1.0000 mg | Freq: Once | INTRAMUSCULAR | Status: AC
  Administered 2023-05-25: 1 mg via INTRAMUSCULAR
  Filled 2023-05-25: qty 1

## 2023-05-25 MED ORDER — CEPHALEXIN 500 MG PO CAPS: 500.0000 mg | ORAL_CAPSULE | Freq: Four times a day (QID) | ORAL | 0 refills | Status: DC

## 2023-05-25 MED ORDER — HYDROCODONE-ACETAMINOPHEN 5-325 MG PO TABS
2.0000 | ORAL_TABLET | Freq: Once | ORAL | Status: AC
  Administered 2023-05-25: 2 via ORAL
  Filled 2023-05-25: qty 2

## 2023-05-25 MED ORDER — AMLODIPINE BESYLATE-VALSARTAN 5-160 MG PO TABS: 1.0000 | ORAL_TABLET | Freq: Every day | ORAL | 0 refills | Status: DC

## 2023-05-25 MED ORDER — STERILE WATER FOR INJECTION IJ SOLN
INTRAMUSCULAR | Status: AC
  Administered 2023-05-25: 2.1 mL
  Filled 2023-05-25: qty 10

## 2023-05-25 NOTE — ED Triage Notes (Signed)
Pt states chronic pain to bilateral knees and hips  States has "bone on bone"  Is taking gabapentin and norco with no relief  Also concern for uti due to burning with urination since Saturday   H/o lupus

## 2023-05-25 NOTE — ED Provider Notes (Addendum)
[facility] DEPART[facility] AT [facility] Provider Note   CSN: [id number] Arrival date & time: 05/25/23  1741     History  Chief Complaint  Patient presents with   Dysuria   Pain    Sierra Atkins is a 57 y.o. female.  Pt with c/o chronic pain to bil hips and right knee - indicates significant arthritis/'bone on bone'. States last took her pain medication this AM. No recent acute or abrupt change in or worsening of pain. No recent trauma or injury.  Has orthopedist and prior left [facility]ka, and states her current medication oxycodone does not seem to work for her as well as her prior hydrocodone/acetaminophen rx.  Also indicates has dysuria. No abd or flank pain. No vaginal discharge or bleeding. No nausea/vomiting.   The history is provided by the patient and medical records.  Dysuria Associated symptoms: no abdominal pain, no fever, no flank pain, no nausea and no vomiting        Home Medications Prior to Admission medications   Medication Sig Start Date End Date Taking? Authorizing Provider  hydrochlorothiazide (HYDRODIURIL) 25 MG tablet Take 1 tablet (25 mg total) by mouth daily for 14 days. 08/12/19 08/26/19  Cristina Gong, PA-C  hydrochlorothiazide (HYDRODIURIL) 25 MG tablet Take 1 tablet (25 mg total) by mouth daily. 07/31/22   Tegeler, Canary Brim, MD  hydroxychloroquine (PLAQUENIL) 200 MG tablet Take by mouth 2 (two) times daily.      [provider]  loperamide (IMODIUM) 2 MG capsule Take 1 capsule (2 mg total) by mouth 4 (four) times daily as needed for diarrhea or loose stools. 08/13/22   Gailen Shelter, PA  Multiple Vitamin (MULTIVITAMIN) capsule Take 1 capsule by mouth daily.      [provider]  Omega-3 Fatty Acids (FISH OIL) 1000 MG CAPS Take 2 capsules by mouth daily.    [provider]  Oxycodone HCl 10 MG TABS Take 1 tablet (10 mg total) by mouth 3 (three) times daily. Patient not taking: Reported on 08/12/2019 10/24/11    Earley Favor, NP  oxyCODONE-acetaminophen (PERCOCET/ROXICET) 5-325 MG tablet Take 1 tablet by mouth every 6 (six) hours as needed for severe pain. 08/12/19   Cristina Gong, PA-C  potassium chloride SA (KLOR-CON M) 20 MEQ tablet Take 1 tablet (20 mEq total) by mouth daily for 6 days. 08/13/22 08/19/22  Gailen Shelter, PA  predniSONE (DELTASONE) 20 MG tablet Take 20 mg by mouth daily with breakfast.    [provider]  Probiotic Product (PROBIOTIC PO) Take 1 capsule by mouth daily.    [provider]  zolpidem (AMBIEN) 10 MG tablet Take 10 mg by mouth at bedtime as needed for sleep. For sleep.    [provider]      Allergies    Fentanyl, Codeine, and Morphine and codeine    Review of Systems   Review of Systems  Constitutional:  Negative for fever.  Respiratory:  Negative for shortness of breath.   Cardiovascular:  Negative for chest pain.  Gastrointestinal:  Negative for abdominal pain, nausea and vomiting.  Genitourinary:  Positive for dysuria. Negative for flank pain.  Musculoskeletal:  Negative for back pain.  Neurological:  Negative for weakness and numbness.    Physical Exam Updated Vital Signs BP (!) 152/88   Pulse 75   Temp 98.6 F (37 C)   Resp 17   Ht 1.702 m (5\' 7" )   Wt 132.5 kg   SpO2  97%   BMI 45.73 kg/m  Physical Exam Vitals and nursing note reviewed.  Constitutional:      Appearance: Normal appearance. She is well-developed.  HENT:     Head: Atraumatic.     Nose: Nose normal.     Mouth/Throat:     Mouth: Mucous membranes are moist.  Eyes:     General: No scleral icterus.    Conjunctiva/sclera: Conjunctivae normal.  Neck:     Trachea: No tracheal deviation.  Cardiovascular:     Rate and Rhythm: Normal rate and regular rhythm.     Pulses: Normal pulses.     Heart sounds: Normal heart sounds. No murmur heard.    No friction rub. No gallop.  Pulmonary:     Effort: Pulmonary effort is normal. No respiratory distress.      Breath sounds: Normal breath sounds.  Abdominal:     General: Bowel sounds are normal. There is no distension.     Palpations: Abdomen is soft.     Tenderness: There is no abdominal tenderness. There is no guarding.  Genitourinary:    Comments: No cva tenderness.  Musculoskeletal:        General: No swelling.     Cervical back: Normal range of motion and neck supple. No rigidity. No muscular tenderness.     Comments: Passive rom bil hip and knees without severe pain. No lower extremity edema. No erythema or increased warmth to joints. Distal pulses palp.   Skin:    General: Skin is warm and dry.     Findings: No rash.  Neurological:     Mental Status: She is alert.     Comments: Alert, speech normal.   Psychiatric:        Mood and Affect: Mood normal.     ED Results / Procedures / Treatments   Labs (all labs ordered are listed, but only abnormal results are displayed) Results for orders placed or performed during the hospital encounter of 05/25/23  Urinalysis, Routine w reflex microscopic -Urine, Clean Catch  Result Value Ref Range   Color, Urine YELLOW YELLOW   APPearance CLOUDY (A) CLEAR   Specific Gravity, Urine >=1.030 1.005 - 1.030   pH 6.0 5.0 - 8.0   Glucose, UA NEGATIVE NEGATIVE mg/dL   Hgb urine dipstick TRACE (A) NEGATIVE   Bilirubin Urine NEGATIVE NEGATIVE   Ketones, ur 15 (A) NEGATIVE mg/dL   Protein, ur 161 (A) NEGATIVE mg/dL   Nitrite NEGATIVE NEGATIVE   Leukocytes,Ua LARGE (A) NEGATIVE  Urinalysis, Microscopic (reflex)  Result Value Ref Range   RBC / HPF 6-10 0 - 5 RBC/hpf   WBC, UA >50 0 - 5 WBC/hpf   Bacteria, UA MANY (A) NONE SEEN   Squamous Epithelial / HPF 6-10 0 - 5 /HPF   Trichomonas, UA PRESENT (A) NONE SEEN      EKG None  Radiology No results found.  Procedures Procedures    Medications Ordered in ED Medications  cefTRIAXone (ROCEPHIN) injection 1 g (has no administration in time range)  HYDROcodone-acetaminophen  (NORCO/VICODIN) 5-325 MG per tablet 2 tablet (2 tablets Oral Given 05/25/23 1849)    ED Course/ Medical Decision Making/ A&P                             Medical Decision Making Problems Addressed: Acute UTI: acute illness or injury Chronic pain of both hips: chronic illness or injury with exacerbation, progression, or side effects of  treatment that poses a threat to life or bodily functions Chronic pain of right knee: chronic illness or injury with exacerbation, progression, or side effects of treatment that poses a threat to life or bodily functions Elevated blood pressure reading: acute illness or injury  Amount and/or Complexity of Data Reviewed External Data Reviewed: notes. Labs: ordered. Decision-making details documented in ED Course.  Risk Prescription drug management. Parenteral controlled substances.   No meds for pain since this AM, requests something for pain. Hydrocodone po.   Labs sent.   Reviewed nursing notes and prior charts for additional history.   Labs reviewed/interpreted by me - ua with many bact/wbc, c/w uti. Rocephin 1 gm.  Rx for home.   No fever. No cva/flank pain. No nv. Pt appears stable for d/c.  Rec pcp f/u.  Return precautions provided.   Pt indicates currently staying w family member, and requests refill of her bp med (amlodipinevalsartan, indicating is no longer on hydrochlorothiazide), and small quantity rx lortab).  Pt also requests pain shot, indicating dilaudid has helped in past. Indicates does not have to drive. Dilaudid 1 mg IM. No driving instructions provided to pt.             Final Clinical Impression(s) / ED Diagnoses Final diagnoses:  Acute UTI  Chronic pain of both hips  Chronic pain of right knee  Elevated blood pressure reading    Rx / DC Orders ED Discharge Orders     None            Cathren Laine, MD 05/25/23 2034

## 2023-05-25 NOTE — Discharge Instructions (Addendum)
It was our pleasure to provide your ER care today - we hope that you feel better.  Drink plenty of fluids/stay well hydrated. Take keflex (antibiotic) as prescribed. Take your pain medication as need.   Follow up with primary care doctor in one week if symptoms fail to improve/resolve. Also follow up with your doctor regarding your joint pains, as well as your blood pressure which is mildly high today.   Return to ER if worse, new symptoms, high fevers, severe abdominal/flank pain, persistent vomiting, or other concern.   You were given pain medication in the ER  - no driving for the next 8 hours or if/when taking opiate type pain meds.

## 2023-05-25 NOTE — ED Notes (Signed)
Discharge paperwork reviewed entirely with patient, including follow up care. Pain was under control. The patient received instruction and coaching on their prescriptions, and all follow-up questions were answered.  Pt verbalized understanding as well as all parties involved. No questions or concerns voiced at the time of discharge. No acute distress noted

## 2023-06-20 ENCOUNTER — Emergency Department (HOSPITAL_COMMUNITY)
Admission: EM | Admit: 2023-06-20 | Discharge: 2023-06-20 | Disposition: A | Discharge status: Home / Self Care | Payer: Medicaid Other | Attending: Emergency Medicine | Admitting: Emergency Medicine

## 2023-06-20 ENCOUNTER — Other Ambulatory Visit: Payer: Self-pay

## 2023-06-20 ENCOUNTER — Encounter (HOSPITAL_COMMUNITY): Payer: Self-pay

## 2023-06-20 ENCOUNTER — Emergency Department (HOSPITAL_COMMUNITY): Payer: Medicaid Other

## 2023-06-20 DIAGNOSIS — R519 Headache, unspecified: Secondary | ICD-10-CM | POA: Diagnosis present

## 2023-06-20 DIAGNOSIS — H43392 Other vitreous opacities, left eye: Secondary | ICD-10-CM | POA: Diagnosis not present

## 2023-06-20 LAB — DIFFERENTIAL
Abs Immature Granulocytes: 0.03 10*3/uL (ref 0.00–0.07)
Basophils Absolute: 0.1 10*3/uL (ref 0.0–0.1)
Basophils Relative: 1 %
Eosinophils Absolute: 0.2 10*3/uL (ref 0.0–0.5)
Eosinophils Relative: 3 %
Immature Granulocytes: 0 %
Lymphocytes Relative: 34 %
Lymphs Abs: 2.8 10*3/uL (ref 0.7–4.0)
Monocytes Absolute: 0.6 10*3/uL (ref 0.1–1.0)
Monocytes Relative: 7 %
Neutro Abs: 4.5 10*3/uL (ref 1.7–7.7)
Neutrophils Relative %: 55 %

## 2023-06-20 LAB — CBC
HCT: 38 % (ref 36.0–46.0)
Hemoglobin: 12.1 g/dL (ref 12.0–15.0)
MCH: 31.5 pg (ref 26.0–34.0)
MCHC: 31.8 g/dL (ref 30.0–36.0)
MCV: 99 fL (ref 80.0–100.0)
Platelets: 427 10*3/uL — ABNORMAL HIGH (ref 150–400)
RBC: 3.84 MIL/uL — ABNORMAL LOW (ref 3.87–5.11)
RDW: 15.1 % (ref 11.5–15.5)
WBC: 8.2 10*3/uL (ref 4.0–10.5)
nRBC: 0 % (ref 0.0–0.2)

## 2023-06-20 LAB — COMPREHENSIVE METABOLIC PANEL
ALT: 14 U/L (ref 0–44)
AST: 18 U/L (ref 15–41)
Albumin: 3.5 g/dL (ref 3.5–5.0)
Alkaline Phosphatase: 69 U/L (ref 38–126)
Anion gap: 9 (ref 5–15)
BUN: 25 mg/dL — ABNORMAL HIGH (ref 6–20)
CO2: 20 mmol/L — ABNORMAL LOW (ref 22–32)
Calcium: 8.8 mg/dL — ABNORMAL LOW (ref 8.9–10.3)
Chloride: 108 mmol/L (ref 98–111)
Creatinine, Ser: 1 mg/dL (ref 0.44–1.00)
GFR, Estimated: >60 mL/min (ref >60)
Glucose, Bld: 140 mg/dL — ABNORMAL HIGH (ref 70–99)
Potassium: 3.7 mmol/L (ref 3.5–5.1)
Sodium: 137 mmol/L (ref 135–145)
Total Bilirubin: 0.4 mg/dL (ref 0.3–1.2)
Total Protein: 7.6 g/dL (ref 6.5–8.1)

## 2023-06-20 LAB — ETHANOL: Alcohol, Ethyl (B): <10 mg/dL (ref <10)

## 2023-06-20 LAB — RAPID URINE DRUG SCREEN, HOSP PERFORMED
Amphetamines: NOT DETECTED
Barbiturates: NOT DETECTED
Benzodiazepines: NOT DETECTED
Cocaine: NOT DETECTED
Opiates: POSITIVE — AB
Tetrahydrocannabinol: NOT DETECTED

## 2023-06-20 MED ORDER — METOCLOPRAMIDE HCL 5 MG/ML IJ SOLN
10.0000 mg | Freq: Once | INTRAMUSCULAR | Status: AC
  Administered 2023-06-20: 10 mg via INTRAVENOUS
  Filled 2023-06-20: qty 2

## 2023-06-20 MED ORDER — DIPHENHYDRAMINE HCL 50 MG/ML IJ SOLN
25.0000 mg | Freq: Once | INTRAMUSCULAR | Status: AC
  Administered 2023-06-20: 25 mg via INTRAVENOUS
  Filled 2023-06-20: qty 1

## 2023-06-20 MED ORDER — SODIUM CHLORIDE 0.9 % IV BOLUS
1000.0000 mL | Freq: Once | INTRAVENOUS | Status: AC
  Administered 2023-06-20: 1000 mL via INTRAVENOUS

## 2023-06-20 MED ORDER — OXYCODONE HCL 5 MG PO TABS
10.0000 mg | ORAL_TABLET | Freq: Once | ORAL | Status: AC
  Administered 2023-06-20: 10 mg via ORAL
  Filled 2023-06-20: qty 2

## 2023-06-20 MED ORDER — DEXAMETHASONE SODIUM PHOSPHATE 10 MG/ML IJ SOLN
10.0000 mg | Freq: Once | INTRAMUSCULAR | Status: AC
  Administered 2023-06-20: 10 mg via INTRAVENOUS
  Filled 2023-06-20: qty 1

## 2023-06-20 MED ORDER — MAGNESIUM SULFATE 2 GM/50ML IV SOLN
2.0000 g | Freq: Once | INTRAVENOUS | Status: AC
  Administered 2023-06-20: 2 g via INTRAVENOUS
  Filled 2023-06-20: qty 50

## 2023-06-20 MED ORDER — TETRACAINE HCL 0.5 % OP SOLN
1.0000 [drp] | Freq: Once | OPHTHALMIC | Status: AC
  Administered 2023-06-20: 1 [drp] via OPHTHALMIC
  Filled 2023-06-20: qty 4

## 2023-06-20 MED ORDER — HYDROMORPHONE HCL 1 MG/ML IJ SOLN
0.5000 mg | Freq: Once | INTRAMUSCULAR | Status: AC
  Administered 2023-06-20: 0.5 mg via INTRAVENOUS
  Filled 2023-06-20: qty 1

## 2023-06-20 MED ORDER — IOHEXOL 350 MG/ML SOLN
75.0000 mL | Freq: Once | INTRAVENOUS | Status: AC | PRN
  Administered 2023-06-20: 75 mL via INTRAVENOUS

## 2023-06-20 MED ORDER — FLUORESCEIN SODIUM 1 MG OP STRP
1.0000 | ORAL_STRIP | Freq: Once | OPHTHALMIC | Status: AC
  Administered 2023-06-20: 1 via OPHTHALMIC
  Filled 2023-06-20: qty 1

## 2023-06-20 NOTE — Discharge Instructions (Signed)
Please follow-up with your primary care doctor, for your chronic pain medication, as well as see the ophthalmologist tomorrow, for your eye floaters.  Your CTA head and neck, was reassuring.  No evidence of any kind of as aneurysm, head bleed, or displacement of your shunt.  Additionally please follow-up with your neurologist outpatient.  If you do not have a neurologist down, please see the above

## 2023-06-20 NOTE — ED Provider Notes (Signed)
[place] EMERGENCY DEPARTMENT AT [facility] Provider Note   CSN: [id number] Arrival date & time: 06/20/23  1515     History  Chief Complaint  Patient presents with   Headache    Sierra Atkins is a 57 y.o. female, History of brain tumor, craniotomy, brain aneurysm, presents to the ED secondary to left eye weakness, floaters, and severe headache since Saturday morning.  She states that since Saturday morning, she has had the symptoms, she felt okay on Friday night, but when she awoke on Saturday morning around 9 AM, she had a floater, or a black line in her vision, that will not go away.  She states that she has had similar symptoms with her last brain aneurysm, and is concerned as she has a severe headache and nausea. She denies any new weakness of her bilateral arms, or legs.  No facial droop.  States that all the symptoms came on suddenly, and have been progressively getting worse.  Notes she feels a little bit lightheaded and dizzy.    Home Medications Prior to Admission medications   Medication Sig Start Date End Date Taking? Authorizing Provider  amLODipine-valsartan (EXFORGE) 5-160 MG tablet Take 1 tablet by mouth daily. 05/25/23   Cathren Laine, MD  cephALEXin (KEFLEX) 500 MG capsule Take 1 capsule (500 mg total) by mouth 4 (four) times daily. 05/25/23   Cathren Laine, MD  hydrochlorothiazide (HYDRODIURIL) 25 MG tablet Take 1 tablet (25 mg total) by mouth daily for 14 days. 08/12/19 08/26/19  Cristina Gong, PA-C  hydrochlorothiazide (HYDRODIURIL) 25 MG tablet Take 1 tablet (25 mg total) by mouth daily. 07/31/22   Tegeler, Canary Brim, MD  HYDROcodone-acetaminophen (NORCO/VICODIN) 5-325 MG tablet Take 1-2 tablets by mouth every 6 (six) hours as needed. 05/25/23   Cathren Laine, MD  hydroxychloroquine (PLAQUENIL) 200 MG tablet Take by mouth 2 (two) times daily.      [provider]  loperamide (IMODIUM) 2 MG capsule Take 1 capsule (2 mg total) by mouth 4 (four)  times daily as needed for diarrhea or loose stools. 08/13/22   Gailen Shelter, PA  Multiple Vitamin (MULTIVITAMIN) capsule Take 1 capsule by mouth daily.      [provider]  Omega-3 Fatty Acids (FISH OIL) 1000 MG CAPS Take 2 capsules by mouth daily.    [provider]  Oxycodone HCl 10 MG TABS Take 1 tablet (10 mg total) by mouth 3 (three) times daily. Patient not taking: Reported on 08/12/2019 10/24/11   Earley Favor, NP  potassium chloride SA (KLOR-CON M) 20 MEQ tablet Take 1 tablet (20 mEq total) by mouth daily for 6 days. 08/13/22 08/19/22  Gailen Shelter, PA  predniSONE (DELTASONE) 20 MG tablet Take 20 mg by mouth daily with breakfast.    [provider]  Probiotic Product (PROBIOTIC PO) Take 1 capsule by mouth daily.    [provider]  zolpidem (AMBIEN) 10 MG tablet Take 10 mg by mouth at bedtime as needed for sleep. For sleep.    [provider]      Allergies    Fentanyl, Codeine, and Morphine and codeine    Review of Systems   Review of Systems  Neurological:  Positive for dizziness and headaches. Negative for syncope.    Physical Exam Updated Vital Signs BP 138/85   Pulse 72   Temp 97.8 F (36.6 C) (Oral)   Resp 18   Ht 5\' 7"  (1.702 m)   Wt 132.5 kg  SpO2 98%   BMI 45.73 kg/m  Physical Exam Vitals and nursing note reviewed.  Constitutional:      General: She is not in acute distress.    Appearance: She is well-developed.  HENT:     Head: Normocephalic and atraumatic.  Eyes:     General: Visual field deficit present.     Extraocular Movements: Extraocular movements intact.     Conjunctiva/sclera: Conjunctivae normal.     Pupils: Pupils are equal, round, and reactive to light. Pupils are equal.     Comments: Negative fluorescein exam  Cardiovascular:     Rate and Rhythm: Normal rate and regular rhythm.     Heart sounds: No murmur heard. Pulmonary:     Effort: Pulmonary effort is normal. No respiratory distress.      Breath sounds: Normal breath sounds.  Abdominal:     Palpations: Abdomen is soft.     Tenderness: There is no abdominal tenderness.  Musculoskeletal:        General: No swelling.     Cervical back: Neck supple.  Skin:    General: Skin is warm and dry.     Capillary Refill: Capillary refill takes less than 2 seconds.  Neurological:     Mental Status: She is alert.     Cranial Nerves: No cranial nerve deficit, dysarthria or facial asymmetry.     Sensory: No sensory deficit.     Motor: Motor function is intact.     Coordination: Coordination is intact.     Comments: Chronic 4 out of 5 strength of bilateral lower extremities per patient which is normal.  5 of 5 strength of bilateral upper extremities.  Psychiatric:        Mood and Affect: Mood normal.     ED Results / Procedures / Treatments   Labs (all labs ordered are listed, but only abnormal results are displayed) Labs Reviewed  CBC - Abnormal; Notable for the following components:      Result Value   RBC 3.84 (*)    Platelets 427 (*)    All other components within normal limits  COMPREHENSIVE METABOLIC PANEL - Abnormal; Notable for the following components:   CO2 20 (*)    Glucose, Bld 140 (*)    BUN 25 (*)    Calcium 8.8 (*)    All other components within normal limits  ETHANOL  DIFFERENTIAL  RAPID URINE DRUG SCREEN, HOSP PERFORMED  I-STAT CHEM 8, ED    EKG None  Radiology CT ANGIO HEAD NECK W WO CM  Result Date: 06/20/2023 CLINICAL DATA:  Hemorrhagic stroke EXAM: CT ANGIOGRAPHY HEAD AND NECK WITH AND WITHOUT CONTRAST TECHNIQUE: Multidetector CT imaging of the head and neck was performed using the standard protocol during bolus administration of intravenous contrast. Multiplanar CT image reconstructions and MIPs were obtained to evaluate the vascular anatomy. Carotid stenosis measurements (when applicable) are obtained utilizing NASCET criteria, using the distal internal carotid diameter as the denominator.  RADIATION DOSE REDUCTION: This exam was performed according to the departmental dose-optimization program which includes automated exposure control, adjustment of the mA and/or kV according to patient size and/or use of iterative reconstruction technique. CONTRAST:  75mL OMNIPAQUE IOHEXOL 350 MG/ML SOLN COMPARISON:  07/31/2022 FINDINGS: CT HEAD FINDINGS Brain: There is no mass, hemorrhage or extra-axial collection. Right posterior approach shunt catheter with tip near the third ventricle. No hydrocephalus Skull: Remote suboccipital craniectomy. Sinuses/Orbits: No fluid levels or advanced mucosal thickening of the visualized paranasal sinuses. No mastoid or  middle ear effusion. The orbits are normal. CTA NECK FINDINGS SKELETON: There is no bony spinal canal stenosis. No lytic or blastic lesion. OTHER NECK: Multiple bilateral subcentimeter cervical lymph nodes, likely reactive. UPPER CHEST: No pneumothorax or pleural effusion. No nodules or masses. AORTIC ARCH: There is calcific atherosclerosis of the aortic arch. Conventional 3 vessel aortic branching pattern. RIGHT CAROTID SYSTEM: Normal without aneurysm, dissection or stenosis. LEFT CAROTID SYSTEM: Normal without aneurysm, dissection or stenosis. VERTEBRAL ARTERIES: Left dominant configuration.There is no dissection, occlusion or flow-limiting stenosis to the skull base (V1-V3 segments). CTA HEAD FINDINGS POSTERIOR CIRCULATION: --Vertebral arteries: Normal V4 segments. --Inferior cerebellar arteries: Normal. --Basilar artery: Normal. --Superior cerebellar arteries: Normal. --Posterior cerebral arteries (PCA): Normal. ANTERIOR CIRCULATION: --Intracranial internal carotid arteries: Normal. --Anterior cerebral arteries (ACA): Normal. Both A1 segments are present. Patent anterior communicating artery (a-comm). --Middle cerebral arteries (MCA): Normal. VENOUS SINUSES: As permitted by contrast timing, patent. ANATOMIC VARIANTS: None Review of the MIP images confirms the  above findings. IMPRESSION: 1. No emergent large vessel occlusion or high-grade stenosis of the intracranial arteries. 2. Right posterior approach shunt catheter with tip near the third ventricle. No hydrocephalus. 3. Unchanged nonspecific cervical adenopathy, possibly reactive. 4.  Aortic atherosclerosis (ICD10-I70.0). Electronically Signed   By: Deatra Robinson M.D.   On: 06/20/2023 20:03    Procedures Procedures    Medications Ordered in ED Medications  magnesium sulfate IVPB 2 g 50 mL (2 g Intravenous New Bag/Given 06/20/23 2232)  oxyCODONE (Oxy IR/ROXICODONE) immediate release tablet 10 mg (has no administration in time range)  oxyCODONE (Oxy IR/ROXICODONE) immediate release tablet 10 mg (10 mg Oral Given 06/20/23 1701)  HYDROmorphone (DILAUDID) injection 0.5 mg (0.5 mg Intravenous Given 06/20/23 2009)  sodium chloride 0.9 % bolus 1,000 mL (0 mLs Intravenous Stopped 06/20/23 2141)  metoCLOPramide (REGLAN) injection 10 mg (10 mg Intravenous Given 06/20/23 2010)  diphenhydrAMINE (BENADRYL) injection 25 mg (25 mg Intravenous Given 06/20/23 2009)  iohexol (OMNIPAQUE) 350 MG/ML injection 75 mL (75 mLs Intravenous Contrast Given 06/20/23 1947)  tetracaine (PONTOCAINE) 0.5 % ophthalmic solution 1 drop (1 drop Left Eye Given by Other 06/20/23 2230)  fluorescein ophthalmic strip 1 strip (1 strip Left Eye Given by Other 06/20/23 2230)  dexamethasone (DECADRON) injection 10 mg (10 mg Intravenous Given 06/20/23 2104)    ED Course/ Medical Decision Making/ A&P                             Medical Decision Making Patient is a 57 year old female, here for floaters in her left eye, that is been going on for the last day and a half.  She also states that she has a left-sided headache, that is tender to the touch.  Denies any loss of vision, just states she has a black line in her vision.  I performed an eye exam on her, and no evidence of any kind of scratched by fluorescein, however Tono-Pen was showing pressures  ranging from 12-40, I performed 5 different times with 5 different results and had another individual performed as well, with irregular results leg as well.  She does not have any eye pain just, the floater and headache.  I spoke with Dr. Ulla Gallo and he took her number, and will call her in the a.m., to have a appointment at his [place] retina specialist facility for further evaluation.  We will obtain a CTA head/neck, for further evaluation given her plentiful history of shunts, brain aneurysms, and  tumors.  She has no neurodeficits on exam, just has that when floater.  Amount and/or Complexity of Data Reviewed Labs:     Details: Labs are unremarkable Radiology:     Details: CTA head/neck, shows no large vessel occlusion, has a right posterior shunt catheter, no hydrocephalus. Discussion of management or test interpretation with external provider(s): Discussed with patient, she is feeling better after multiple medications for her head, is requesting something for her bilateral hip pain which is chronic.  Denies any falls.  She has no evidence of any kind of stenosis or occlusion on CTA head/neck, no brain bleed, no aneurysm, that is new.  I already have her an appointment for tomorrow with Dr. Ulla Gallo, he will call in the a.m. to make this appointment.  We discharged her with improved headache from an 8 to a 4 out of 10.  She is agreeable with this, and understands to return to the ER if symptoms are worsening.  She has no pain URI, and she has no erythema to the eye.  No visual deficit other than the floater.  Risk Prescription drug management.    Final Clinical Impression(s) / ED Diagnoses Final diagnoses:  Floaters in visual field, left  Acute nonintractable headache, unspecified headache type    Rx / DC Orders ED Discharge Orders     None         Lavetta Geier Elbert Ewings, PA 06/20/23 2325    Tanda Rockers A, DO 06/21/23 [phone]

## 2023-06-20 NOTE — ED Provider Triage Note (Signed)
Emergency Medicine Provider Triage Evaluation Note  Sierra Atkins , a 57 y.o. female  was evaluated in triage.  Pt complains of but severe headache on the left side x 2 days she has floaters in the eye.  History of brain tumor and craniotomy and previous brain aneurysm..  Review of Systems  Positive: Severe headache Negative: Vomiting  Physical Exam  BP (!) 138/98 (BP Location: Right Arm)   Pulse 93   Temp 97.8 F (36.6 C) (Oral)   Resp 17   Ht 5\' 7"  (1.702 m)   Wt 132.5 kg   SpO2 100%   BMI 45.73 kg/m  Gen:   Awake, no distress   Resp:  Normal effort  MSK:   Moves extremities without difficulty  Other:    Medical Decision Making  Medically screening exam initiated at 4:38 PM.  Appropriate orders placed.  Sierra Atkins was informed that the remainder of the evaluation will be completed by another provider, this initial triage assessment does not replace that evaluation, and the importance of remaining in the ED until their evaluation is complete.     Arthor Captain, PA-C 06/20/23 [phone]

## 2023-06-20 NOTE — ED Triage Notes (Signed)
Pt reports hx of lupus, osteoarthritis "I have bone on bone" everywhere. She reports headache and "I see black lines" in her left eye. She reports "I'm just in a lot of pain."

## 2023-06-29 ENCOUNTER — Emergency Department (HOSPITAL_BASED_OUTPATIENT_CLINIC_OR_DEPARTMENT_OTHER)
Admission: EM | Admit: 2023-06-29 | Discharge: 2023-06-29 | Disposition: A | Discharge status: Home / Self Care | Payer: Medicaid Other | Attending: Emergency Medicine | Admitting: Emergency Medicine

## 2023-06-29 ENCOUNTER — Other Ambulatory Visit: Payer: Self-pay

## 2023-06-29 ENCOUNTER — Encounter (HOSPITAL_BASED_OUTPATIENT_CLINIC_OR_DEPARTMENT_OTHER): Payer: Self-pay | Admitting: Emergency Medicine

## 2023-06-29 DIAGNOSIS — I10 Essential (primary) hypertension: Secondary | ICD-10-CM | POA: Insufficient documentation

## 2023-06-29 DIAGNOSIS — Z79899 Other long term (current) drug therapy: Secondary | ICD-10-CM | POA: Insufficient documentation

## 2023-06-29 DIAGNOSIS — M791 Myalgia, unspecified site: Secondary | ICD-10-CM | POA: Diagnosis present

## 2023-06-29 DIAGNOSIS — U071 COVID-19: Secondary | ICD-10-CM

## 2023-06-29 MED ORDER — PAXLOVID (300/100) 20 X 150 MG & 10 X 100MG PO TBPK: 3.0000 | ORAL_TABLET | Freq: Two times a day (BID) | ORAL | 0 refills | Status: DC

## 2023-06-29 MED ORDER — FLUTICASONE PROPIONATE 50 MCG/ACT NA SUSP: 2.0000 | Freq: Every day | NASAL | 2 refills | Status: DC

## 2023-06-29 MED ORDER — PAXLOVID (300/100) 20 X 150 MG & 10 X 100MG PO TBPK: 3.0000 | ORAL_TABLET | Freq: Two times a day (BID) | ORAL | 0 refills | Status: AC

## 2023-06-29 MED ORDER — FLUTICASONE PROPIONATE 50 MCG/ACT NA SUSP: 2.0000 | Freq: Every day | NASAL | 0 refills | Status: DC | PRN

## 2023-06-29 NOTE — ED Provider Notes (Signed)
[place] EMERGENCY DEPARTMENT AT [facility] Provider Note   CSN: [id number] Arrival date & time: 06/29/23  1146     History  Chief Complaint  Patient presents with   Generalized Body Aches    Sierra Atkins is a 57 y.o. female who presents today after concern for a positive home COVID test yesterday.  States she started to have chills and bodyaches yesterday and tested positive for COVID.  States she also has a mild headache and nasal congestion.  Denies any shortness of breath, fevers, cough, sore throat.  She requests that her oxycodone be filled for her pain.  HPI     Home Medications Prior to Admission medications   Medication Sig Start Date End Date Taking? Authorizing Provider  fluticasone (FLONASE) 50 MCG/ACT nasal spray Place 2 sprays into both nostrils daily as needed for up to 7 days (As needed for nasal congestion). 06/29/23 07/06/23 Yes Arabella Merles, PA-C  nirmatrelvir & ritonavir (PAXLOVID, 300/100,) 20 x 150 MG & 10 x 100MG  TBPK Take 3 tablets by mouth 2 (two) times daily for 5 days. 06/29/23 07/04/23 Yes Arabella Merles, PA-C  amLODipine-valsartan (EXFORGE) 5-160 MG tablet Take 1 tablet by mouth daily. 05/25/23   Cathren Laine, MD  cephALEXin (KEFLEX) 500 MG capsule Take 1 capsule (500 mg total) by mouth 4 (four) times daily. 05/25/23   Cathren Laine, MD  hydrochlorothiazide (HYDRODIURIL) 25 MG tablet Take 1 tablet (25 mg total) by mouth daily for 14 days. 08/12/19 08/26/19  Cristina Gong, PA-C  hydrochlorothiazide (HYDRODIURIL) 25 MG tablet Take 1 tablet (25 mg total) by mouth daily. 07/31/22   Tegeler, Canary Brim, MD  HYDROcodone-acetaminophen (NORCO/VICODIN) 5-325 MG tablet Take 1-2 tablets by mouth every 6 (six) hours as needed. 05/25/23   Cathren Laine, MD  hydroxychloroquine (PLAQUENIL) 200 MG tablet Take by mouth 2 (two) times daily.      [provider]  loperamide (IMODIUM) 2 MG capsule Take 1 capsule (2 mg total) by mouth 4 (four) times  daily as needed for diarrhea or loose stools. 08/13/22   Gailen Shelter, PA  Multiple Vitamin (MULTIVITAMIN) capsule Take 1 capsule by mouth daily.      [provider]  Omega-3 Fatty Acids (FISH OIL) 1000 MG CAPS Take 2 capsules by mouth daily.    [provider]  Oxycodone HCl 10 MG TABS Take 1 tablet (10 mg total) by mouth 3 (three) times daily. Patient not taking: Reported on 08/12/2019 10/24/11   Earley Favor, NP  potassium chloride SA (KLOR-CON M) 20 MEQ tablet Take 1 tablet (20 mEq total) by mouth daily for 6 days. 08/13/22 08/19/22  Gailen Shelter, PA  predniSONE (DELTASONE) 20 MG tablet Take 20 mg by mouth daily with breakfast.    [provider]  Probiotic Product (PROBIOTIC PO) Take 1 capsule by mouth daily.    [provider]  zolpidem (AMBIEN) 10 MG tablet Take 10 mg by mouth at bedtime as needed for sleep. For sleep.    [provider]      Allergies    Fentanyl, Codeine, and Morphine and codeine    Review of Systems   Review of Systems  Constitutional:  Positive for chills.    Physical Exam Updated Vital Signs BP (!) 132/107   Pulse 92   Temp 98.2 F (36.8 C) (Oral)   Resp (!) 26   Ht 5\' 7"  (1.702 m)   Wt 132.5 kg   SpO2 100%   BMI  45.73 kg/m  Physical Exam Vitals and nursing note reviewed.  Constitutional:      General: She is not in acute distress.    Appearance: She is well-developed. She is obese.  HENT:     Head: Normocephalic and atraumatic.     Nose: Nose normal.     Mouth/Throat:     Mouth: Mucous membranes are moist.     Pharynx: No oropharyngeal exudate or posterior oropharyngeal erythema.  Eyes:     Conjunctiva/sclera: Conjunctivae normal.  Cardiovascular:     Rate and Rhythm: Normal rate and regular rhythm.     Heart sounds: No murmur heard. Pulmonary:     Effort: Pulmonary effort is normal. No respiratory distress.     Breath sounds: Normal breath sounds.  Abdominal:     General: Abdomen is  flat.     Palpations: Abdomen is soft.     Tenderness: There is no abdominal tenderness.  Musculoskeletal:        General: No swelling.     Cervical back: Neck supple.  Skin:    General: Skin is warm and dry.     Capillary Refill: Capillary refill takes less than 2 seconds.  Neurological:     Mental Status: She is alert.  Psychiatric:        Mood and Affect: Mood normal.     ED Results / Procedures / Treatments   Labs (all labs ordered are listed, but only abnormal results are displayed) Labs Reviewed - No data to display  EKG None  Radiology No results found.  Procedures Procedures    Medications Ordered in ED Medications - No data to display  ED Course/ Medical Decision Making/ A&P                                 Medical Decision Making  57 y.o. female with pertinent past medical history of lupus, arthritis, hypertension presents to the ED for concern of positive home COVID test yesterday  Differential diagnosis includes but is not limited to COVID, URI, pneumonia  ED Course:  Patient presents with concern for positive home COVID test yesterday.  She started having chills and bodyaches yesterday night, also a slight headache and nasal congestion.  She denies any fevers, shortness of breath, cough, lungs clear to auscultation bilaterally, satting well on room air, do not feel she needs x-ray imaging at this time to look for pneumonia.  She is requesting oxycodone for pain control, however discussed that she needs to follow-up with the provider who prescribes this medication if she needs adjustments to this medication.  She was instructed to take ibuprofen and Tylenol at home for her pain and fever.  She was also prescribed Paxlovid and fluticasone nasal spray for nasal congestion.   Impression: COVID-19 positive  Disposition:  The patient was discharged home with instructions to take ibuprofen and Tylenol as needed for pain control, she was prescribed Paxlovid  and fluticasone nasal spray.  Follow-up with primary care provider if symptoms have not improved within the next week. Return precautions given.    Co morbidities that complicate the patient evaluation  Lupus, arthritis, hypertension              Final Clinical Impression(s) / ED Diagnoses Final diagnoses:  COVID-19    Rx / DC Orders ED Discharge Orders          Ordered    nirmatrelvir & ritonavir (PAXLOVID,  300/100,) 20 x 150 MG & 10 x 100MG  TBPK  2 times daily        06/29/23 1224    fluticasone (FLONASE) 50 MCG/ACT nasal spray  Daily PRN        06/29/23 1224              Arabella Merles, PA-C 06/29/23 1233    Terrilee Files, MD 06/29/23 1729

## 2023-06-29 NOTE — Discharge Instructions (Addendum)
You were seen here today for a positive COVID result at home  Please wear a mask and quarantine. You may discontinue when: - It has been at least 5 days have passed since symptoms 1st appeared. - AND have had resolution of fever without the use of fever-reducing medications for 24 hours  Please wash hands frequently and do not share eating utensils or dishware with others.   You have been prescribed Paxlovid, please pick this up at your pharmacy and take this as prescribed  As discussed, You may use up to 800mg  ibuprofen every 6 hours as needed for pain.   You may also alternate this with 1000mg  of tylenol every 6 hours as needed for pain (Take ibuprofen, then 3 hours later take tylenol, then 3 hours later take ibuprofen, etc. As needed for pain)  As discussed you may also use Flonase (fluticasone) nasal spray for your nasal congestion.  Please follow-up with your primary care provider if your symptoms have not started to improve next week.  Please return to the ER if you develop shortness of breath, fever that will not respond to tylenol, any other new or concerning symptoms

## 2023-06-29 NOTE — ED Triage Notes (Signed)
Pt POV c/o full body pain starting yesterday, tested + Covid today, sx started yesterday.   Also c/o severe pain in BL hips.  Tylenol taken 0830 today.   Also reports being out of narcotic pain medication.

## 2023-11-22 ENCOUNTER — Emergency Department (HOSPITAL_COMMUNITY)
Admission: EM | Admit: 2023-11-22 | Discharge: 2023-11-22 | Disposition: A | Discharge status: Home / Self Care | Payer: Medicaid Other | Attending: Emergency Medicine | Admitting: Emergency Medicine

## 2023-11-22 ENCOUNTER — Emergency Department (HOSPITAL_BASED_OUTPATIENT_CLINIC_OR_DEPARTMENT_OTHER): Payer: Medicaid Other

## 2023-11-22 ENCOUNTER — Emergency Department (HOSPITAL_COMMUNITY): Payer: Medicaid Other

## 2023-11-22 DIAGNOSIS — M7989 Other specified soft tissue disorders: Secondary | ICD-10-CM | POA: Diagnosis present

## 2023-11-22 DIAGNOSIS — R600 Localized edema: Secondary | ICD-10-CM | POA: Insufficient documentation

## 2023-11-22 DIAGNOSIS — I10 Essential (primary) hypertension: Secondary | ICD-10-CM | POA: Diagnosis not present

## 2023-11-22 DIAGNOSIS — Z85841 Personal history of malignant neoplasm of brain: Secondary | ICD-10-CM | POA: Diagnosis not present

## 2023-11-22 DIAGNOSIS — Z79899 Other long term (current) drug therapy: Secondary | ICD-10-CM | POA: Insufficient documentation

## 2023-11-22 LAB — BASIC METABOLIC PANEL
Anion gap: 7 (ref 5–15)
BUN: 23 mg/dL — ABNORMAL HIGH (ref 6–20)
CO2: 22 mmol/L (ref 22–32)
Calcium: 9.1 mg/dL (ref 8.9–10.3)
Chloride: 110 mmol/L (ref 98–111)
Creatinine, Ser: 0.93 mg/dL (ref 0.44–1.00)
GFR, Estimated: >60 mL/min (ref >60)
Glucose, Bld: 104 mg/dL — ABNORMAL HIGH (ref 70–99)
Potassium: 3.4 mmol/L — ABNORMAL LOW (ref 3.5–5.1)
Sodium: 139 mmol/L (ref 135–145)

## 2023-11-22 LAB — CBC
HCT: 38.6 % (ref 36.0–46.0)
Hemoglobin: 12.6 g/dL (ref 12.0–15.0)
MCH: 32.5 pg (ref 26.0–34.0)
MCHC: 32.6 g/dL (ref 30.0–36.0)
MCV: 99.5 fL (ref 80.0–100.0)
Platelets: 352 10*3/uL (ref 150–400)
RBC: 3.88 MIL/uL (ref 3.87–5.11)
RDW: 13.7 % (ref 11.5–15.5)
WBC: 7.3 10*3/uL (ref 4.0–10.5)
nRBC: 0 % (ref 0.0–0.2)

## 2023-11-22 LAB — BRAIN NATRIURETIC PEPTIDE: B Natriuretic Peptide: 24.3 pg/mL (ref 0.0–100.0)

## 2023-11-22 MED ORDER — OXYCODONE-ACETAMINOPHEN 5-325 MG PO TABS
1.0000 | ORAL_TABLET | Freq: Once | ORAL | Status: AC
  Administered 2023-11-22: 1 via ORAL
  Filled 2023-11-22: qty 1

## 2023-11-22 MED ORDER — FUROSEMIDE 10 MG/ML IJ SOLN
80.0000 mg | Freq: Once | INTRAMUSCULAR | Status: AC
  Administered 2023-11-22: 80 mg via INTRAVENOUS
  Filled 2023-11-22: qty 8

## 2023-11-22 MED ORDER — FUROSEMIDE 40 MG PO TABS: 40.0000 mg | ORAL_TABLET | Freq: Every day | ORAL | 0 refills | Status: DC

## 2023-11-22 NOTE — ED Notes (Signed)
Pt refused to ambulate in hallway unable to sign off.

## 2023-11-22 NOTE — ED Notes (Signed)
Lab to add on BNP.  °

## 2023-11-22 NOTE — ED Provider Notes (Signed)
  Physical Exam  BP (!) 176/74   Pulse 66   Temp (!) 97.4 F (36.3 C) (Oral)   Resp 18   SpO2 99%   Physical Exam  Procedures  Procedures  ED Course / MDM     Received care of patient from Dr. Wilkie Aye.  Please see her note for prior history, physical and care.  Briefly this is a 57 year old female who presented with concern for bilateral lower extremity edema.  She was evaluated by Dr. Wilkie Aye, given IV Lasix, increased Lasix prescription for a few days with plan for discharge.  Dr. Wilkie Aye also observed her ambulating in the emergency department.  Prior to discharge, patient reports that she is concerned to go home with the swelling that she has.  She wants to understand why she has her lower extremity edema.  On my exam, she has pulses bilaterally, no sign of acute arterial thrombus as etiology of pain.  Ordered DVT ultrasound to evaluate for DVT as etiology of her swelling, with DVT study returning and negative.  She has mild erythema which appears to be more consistent with a venous stasis and less consistent with cellulitis.  Labs were reviewed and she otherwise has no leukocytosis, normal BNP, and chest x-ray showed no sign of edema.  Discussed that while I understand the challenges of her bilateral edema, I agree with Dr. Wilkie Aye and do not see an indication for admission to the hospital.  Recommend elevation of her legs, compression stockings, outpatient follow-up for continued evaluation for etiology, and may increase lasix as Dr. Wilkie Aye prescribed. Patient discharged in stable condition with understanding of reasons to return.     Sierra Monday, MD 11/23/23 0000

## 2023-11-22 NOTE — ED Notes (Signed)
Pt upset about being discharged pt sitting in chair while bed airing out from bleach wipes. Writer and another NT changed pt linen again. pt states her legs are still swollen and wants to be seen by a different provider, Programmer, applications notified.

## 2023-11-22 NOTE — ED Notes (Addendum)
Pt requested purewick, in the process of making sure pt meets requirements and asking MD if it was beneficial for pt or a bedside commode. I returned to pt room and pt states " I already peed in the bed, I told yall I need a purewick" pt moved to a chair in the room with no assistance while Clinical research associate and Rn changed pt linen. The purewick was placed after that. Pt now in bed resting.

## 2023-11-22 NOTE — ED Triage Notes (Signed)
Patient arrived with complaints of bilateral leg edema over the last week. Taking 80 mg of Lasix with no relief.

## 2023-11-22 NOTE — ED Notes (Signed)
Pt concerned about swelling in her lower legs and medication not reducing the swelling. Pt is on Lasix and was given IV

## 2023-11-22 NOTE — Progress Notes (Signed)
Lower extremity venous duplex completed. Please see CV Procedures for preliminary results.  Shona Simpson, RVT 11/22/23 10:00 AM

## 2023-11-22 NOTE — ED Notes (Signed)
Pt would like to wait for bloodwork until she gets an IV. Pt states, "I am a hard stick."

## 2023-11-22 NOTE — ED Notes (Signed)
Patient d/c but stated she wont have a ride home until 10 am when her daughter gets out of her meeting. Nurse offered for patient to wait in the waiting room but patient refused. Patients wants to speak with the dr due to her not being able to be discharged at this time and also about more pain medication. Patient stated the prescription she had for pain is out now. She stated she only received 15/25 that was ordered due to pharmacy shortage per patient.  Provider notified

## 2023-11-22 NOTE — Discharge Instructions (Addendum)
You were seen today for lower extremity swelling.  Take an extra 40 mg of Lasix for the next 3 days and follow-up closely with your primary doctor.  Make sure to keep the legs elevated.  He requested pain medication but it appears that you received a prescription on 12/23.  Because of this, I am unable to prescribe further medications.  Follow-up with your primary doctor.  Your blood pressure was notably elevated.  Make sure that you are taking your blood pressure medications as directed.

## 2023-11-22 NOTE — ED Provider Notes (Signed)
[place] EMERGENCY DEPARTMENT AT [facility] Provider Note   CSN: [id number] Arrival date & time: 11/22/23  0011     History  Chief Complaint  Patient presents with   Leg Swelling    Sierra Atkins is a 57 y.o. female.  HPI     This is a 57 year old female who presents with bilateral lower extremity swelling.  Patient reports 1 week history of worsening bilateral lower extremity swelling and pain.  Denies any significant shortness of breath but at baseline has to sleep mostly upright.  Was hospitalized recently for similar complaints and required diuresis.  She reports compliance with her daily Lasix.  She does state that she continues to urinate but her legs are not getting any better.  Denies chest pain or other symptoms.  Home Medications Prior to Admission medications   Medication Sig Start Date End Date Taking? Authorizing Provider  amLODipine-valsartan (EXFORGE) 5-160 MG tablet Take 1 tablet by mouth daily. 05/25/23  Yes Cathren Laine, MD  carvedilol (COREG) 6.25 MG tablet Take 6.25 mg by mouth 2 (two) times daily.   Yes [provider]  diclofenac Sodium (VOLTAREN) 1 % GEL Apply 4 g topically 4 (four) times daily as needed. 04/16/23  Yes [provider]  fluticasone (FLONASE) 50 MCG/ACT nasal spray Place 2 sprays into both nostrils daily for 7 days. 06/29/23 11/22/23 Yes Arabella Merles, PA-C  furosemide (LASIX) 40 MG tablet Take 40 mg by mouth 2 (two) times daily. 08/15/23  Yes [provider]  furosemide (LASIX) 40 MG tablet Take 1 tablet (40 mg total) by mouth daily. 11/22/23  Yes Abdon Petrosky, Mayer Masker, MD  gabapentin (NEURONTIN) 300 MG capsule Take 300 mg by mouth 3 (three) times daily. 12/21/22  Yes [provider]  HYDROcodone-acetaminophen (NORCO) 10-325 MG tablet Take 1 tablet by mouth every 6 (six) hours as needed.   Yes [provider]  hydroxychloroquine (PLAQUENIL) 200 MG tablet Take by mouth 2 (two) times daily.      Yes [provider]  methocarbamol (ROBAXIN) 500 MG tablet Take 500 mg by mouth 4 (four) times daily. 08/06/23  Yes [provider]  methotrexate (RHEUMATREX) 2.5 MG tablet Take 15 mg by mouth. 04/23/23  Yes [provider]  Multiple Vitamin (MULTIVITAMIN) capsule Take 1 capsule by mouth daily.     Yes [provider]  Omega-3 Fatty Acids (FISH OIL) 1000 MG CAPS Take 2 capsules by mouth daily.   Yes [provider]  oxyCODONE-acetaminophen (PERCOCET) 7.5-325 MG tablet Take 1 tablet by mouth every 8 (eight) hours as needed. 11/15/23 11/22/23 Yes [provider]  predniSONE (DELTASONE) 20 MG tablet Take 20 mg by mouth daily with breakfast.   Yes [provider]  spironolactone (ALDACTONE) 25 MG tablet Take 1 tablet by mouth daily. 08/15/23  Yes [provider]  zolpidem (AMBIEN) 10 MG tablet Take 10 mg by mouth at bedtime as needed for sleep. For sleep.   Yes [provider]  cephALEXin (KEFLEX) 500 MG capsule Take 1 capsule (500 mg total) by mouth 4 (four) times daily. Patient not taking: Reported on 11/22/2023 05/25/23   Cathren Laine, MD  hydrochlorothiazide (HYDRODIURIL) 25 MG tablet Take 1 tablet (25 mg total) by mouth daily for 14 days. Patient not taking: Reported on 11/22/2023 08/12/19 08/26/19  Cristina Gong, PA-C  hydrochlorothiazide (HYDRODIURIL) 25 MG tablet Take 1 tablet (25 mg total) by mouth daily. Patient not taking: Reported on 11/22/2023 07/31/22   Tegeler, Canary Brim,  MD  loperamide (IMODIUM) 2 MG capsule Take 1 capsule (2 mg total) by mouth 4 (four) times daily as needed for diarrhea or loose stools. Patient not taking: Reported on 11/22/2023 08/13/22   Gailen Shelter, PA  Oxycodone HCl 10 MG TABS Take 1 tablet (10 mg total) by mouth 3 (three) times daily. Patient not taking: Reported on 08/12/2019 10/24/11   Earley Favor, NP  potassium chloride SA (KLOR-CON M) 20 MEQ tablet Take 1 tablet (20 mEq  total) by mouth daily for 6 days. Patient not taking: Reported on 11/22/2023 08/13/22 08/19/22  Gailen Shelter, PA  Probiotic Product (PROBIOTIC PO) Take 1 capsule by mouth daily.    [provider]      Allergies    Fentanyl, Codeine, and Morphine and codeine    Review of Systems   Review of Systems  Constitutional:  Negative for fever.  Respiratory:  Negative for shortness of breath.   Cardiovascular:  Positive for leg swelling. Negative for chest pain.    Physical Exam Updated Vital Signs BP (!) 163/85   Pulse 66   Temp (!) 97.4 F (36.3 C) (Oral)   Resp 18   SpO2 99%  Physical Exam Vitals and nursing note reviewed.  Constitutional:      Appearance: She is well-developed. She is obese. She is not ill-appearing.  HENT:     Head: Normocephalic and atraumatic.  Eyes:     Pupils: Pupils are equal, round, and reactive to light.  Cardiovascular:     Rate and Rhythm: Normal rate and regular rhythm.     Heart sounds: Normal heart sounds.  Pulmonary:     Effort: Pulmonary effort is normal. No respiratory distress.     Breath sounds: No wheezing.  Abdominal:     Palpations: Abdomen is soft.     Tenderness: There is no abdominal tenderness. There is no guarding or rebound.  Musculoskeletal:     Cervical back: Neck supple.     Right lower leg: Edema present.     Left lower leg: Edema present.     Comments: 3+ pitting edema bilateral lower extremities to the knees  Skin:    General: Skin is warm and dry.  Neurological:     Mental Status: She is alert and oriented to person, place, and time.  Psychiatric:        Mood and Affect: Mood normal.     ED Results / Procedures / Treatments   Labs (all labs ordered are listed, but only abnormal results are displayed) Labs Reviewed  BASIC METABOLIC PANEL - Abnormal; Notable for the following components:      Result Value   Potassium 3.4 (*)    Glucose, Bld 104 (*)    BUN 23 (*)    All other components within normal  limits  CBC  BRAIN NATRIURETIC PEPTIDE    EKG EKG Interpretation Date/Time:  Monday November 22 2023 04:33:07 EST Ventricular Rate:  65 PR Interval:  175 QRS Duration:  111 QT Interval:  436 QTC Calculation: 454 R Axis:   -15  Text Interpretation: Sinus rhythm Borderline left axis deviation Low voltage, precordial leads Abnormal R-wave progression, early transition Confirmed by Ross Marcus ([id number]) on 11/22/2023 5:04:52 AM  Radiology DG Chest Portable 1 View Result Date: 11/22/2023 CLINICAL DATA:  57 year old female with lower extremity swelling and shortness of breath. EXAM: PORTABLE CHEST 1 VIEW COMPARISON:  Chest radiographs 08/12/2023 and earlier. FINDINGS: Portable AP semi upright view at 0421 hours. Stable  somewhat low lung volumes. Stable mediastinal contours, heart size at the upper limits of normal. Visualized tracheal air column is within normal limits. Pulmonary vascularity appears decreased compared to September radiographs. No edema, pneumothorax. No convincing pleural effusion. No confluent lung opacity. No acute osseous abnormality identified. IMPRESSION: Low lung volumes, otherwise no acute cardiopulmonary abnormality. Electronically Signed   By: Odessa Fleming M.D.   On: 11/22/2023 04:50    Procedures .Critical Care  Performed by: Shon Baton, MD Authorized by: Shon Baton, MD   Critical care provider statement:    Critical care time (minutes):  31   Critical care was necessary to treat or prevent imminent or life-threatening deterioration of the following conditions:  Cardiac failure   Critical care was time spent personally by me on the following activities:  Development of treatment plan with patient or surrogate, discussions with consultants, evaluation of patient's response to treatment, examination of patient, ordering and review of laboratory studies, ordering and review of radiographic studies, ordering and performing treatments and interventions,  pulse oximetry, re-evaluation of patient's condition and review of old charts     Medications Ordered in ED Medications  furosemide (LASIX) injection 80 mg (80 mg Intravenous Given 11/22/23 0445)  oxyCODONE-acetaminophen (PERCOCET/ROXICET) 5-325 MG per tablet 1 tablet (1 tablet Oral Given 11/22/23 0445)    ED Course/ Medical Decision Making/ A&P                                 Medical Decision Making Amount and/or Complexity of Data Reviewed Labs: ordered. Radiology: ordered.  Risk Prescription drug management.   This patient presents to the ED for concern of lower extremity edema, this involves an extensive number of treatment options, and is a complaint that carries with it a high risk of complications and morbidity.  I considered the following differential and admission for this acute, potentially life threatening condition.  The differential diagnosis includes dependent edema, heart failure, third spacing, dietary indiscretion  MDM:    This is a 57 year old female who presents with lower extremity swelling.  She is chronically ill-appearing and obese but nontoxic-appearing.  Vital signs notable initially for blood pressure 139/104.  This is fairly labile while in the emergency department.  Repeat blood pressures 163/85.  She has poorly controlled blood pressure.  She reports compliance with her medications.  No obvious signs or symptoms of hypertensive urgency or emergency.  BNP is not elevated.  Chest x-ray does not show any evidence of pulmonary edema.  She is not hypoxic.  Patient was given 1 IV dose of 80 mg of Lasix.  She diuresed well.  Do not think that she needs admission.  Will prescribe additional 40 mg Lasix daily for the next 3 days.  Recommend close follow-up.  Patient did request pain medications at discharge.  She just had a prescription filled on 12/23.  For this reason, additional narcotic pain medications were not prescribed.  (Labs, imaging, consults)  Labs: I  Ordered, and personally interpreted labs.  The pertinent results include: CBC, BMP, BNP  Imaging Studies ordered: I ordered imaging studies including chest x-ray I independently visualized and interpreted imaging. I agree with the radiologist interpretation  Additional history obtained from chart review.  External records from outside source obtained and reviewed including prior admissions and notes  Cardiac Monitoring: The patient was maintained on a cardiac monitor.  If on the cardiac monitor, I personally viewed and  interpreted the cardiac monitored which showed an underlying rhythm of: Sinus  Reevaluation: After the interventions noted above, I reevaluated the patient and found that they have :stayed the same  Social Determinants of Health:  lives independently  Disposition: Discharge  Co morbidities that complicate the patient evaluation  Past Medical History:  Diagnosis Date   Arthritis    Back pain    Brain tumor (benign) (HCC)    Hypertension    Lupus (HCC)      Medicines Meds ordered this encounter  Medications   furosemide (LASIX) injection 80 mg   oxyCODONE-acetaminophen (PERCOCET/ROXICET) 5-325 MG per tablet 1 tablet    Refill:  0   furosemide (LASIX) 40 MG tablet    Sig: Take 1 tablet (40 mg total) by mouth daily.    Dispense:  3 tablet    Refill:  0    I have reviewed the patients home medicines and have made adjustments as needed  Problem List / ED Course: Problem List Items Addressed This Visit   None Visit Diagnoses       Peripheral edema    -  Primary     Uncontrolled hypertension       Relevant Medications   furosemide (LASIX) injection 80 mg (Completed)   carvedilol (COREG) 6.25 MG tablet   spironolactone (ALDACTONE) 25 MG tablet   furosemide (LASIX) 40 MG tablet   furosemide (LASIX) 40 MG tablet                   Final Clinical Impression(s) / ED Diagnoses Final diagnoses:  Peripheral edema  Uncontrolled hypertension     Rx / DC Orders ED Discharge Orders          Ordered    furosemide (LASIX) 40 MG tablet  Daily        11/22/23 0635              Shon Baton, MD 11/22/23 [phone]

## 2023-11-30 ENCOUNTER — Encounter (HOSPITAL_BASED_OUTPATIENT_CLINIC_OR_DEPARTMENT_OTHER): Payer: Self-pay | Admitting: Urology

## 2023-11-30 ENCOUNTER — Emergency Department (HOSPITAL_BASED_OUTPATIENT_CLINIC_OR_DEPARTMENT_OTHER)
Admission: EM | Admit: 2023-11-30 | Discharge: 2023-11-30 | Disposition: A | Discharge status: Home / Self Care | Payer: Medicaid Other | Attending: Emergency Medicine | Admitting: Emergency Medicine

## 2023-11-30 DIAGNOSIS — L03115 Cellulitis of right lower limb: Secondary | ICD-10-CM | POA: Diagnosis not present

## 2023-11-30 DIAGNOSIS — L0390 Cellulitis, unspecified: Secondary | ICD-10-CM

## 2023-11-30 DIAGNOSIS — M7989 Other specified soft tissue disorders: Secondary | ICD-10-CM | POA: Diagnosis present

## 2023-11-30 DIAGNOSIS — L03116 Cellulitis of left lower limb: Secondary | ICD-10-CM | POA: Diagnosis not present

## 2023-11-30 DIAGNOSIS — R600 Localized edema: Secondary | ICD-10-CM

## 2023-11-30 MED ORDER — DOXYCYCLINE HYCLATE 100 MG PO CAPS: 100.0000 mg | ORAL_CAPSULE | Freq: Two times a day (BID) | ORAL | 0 refills | Status: DC

## 2023-11-30 MED ORDER — OXYCODONE HCL 5 MG PO TABS
5.0000 mg | ORAL_TABLET | Freq: Once | ORAL | Status: AC
  Administered 2023-11-30: 5 mg via ORAL
  Filled 2023-11-30: qty 1

## 2023-11-30 MED ORDER — OXYCODONE HCL 5 MG PO TABS: 5.0000 mg | ORAL_TABLET | Freq: Four times a day (QID) | ORAL | 0 refills | Status: DC | PRN

## 2023-11-30 MED ORDER — DOXYCYCLINE HYCLATE 100 MG PO TABS
100.0000 mg | ORAL_TABLET | Freq: Once | ORAL | Status: AC
  Administered 2023-11-30: 100 mg via ORAL
  Filled 2023-11-30: qty 1

## 2023-11-30 NOTE — Discharge Instructions (Signed)
 Take antibiotics as prescribed.  Take Roxicodone for breakthrough pain.  Follow-up with her primary care doctor for long-term management.

## 2023-11-30 NOTE — ED Triage Notes (Signed)
 Bilateral leg swelling x 2 weeks  Was seen at [facility] on 12/30  States pain with walking  Sent by PCP  Taking lasix 120mg  daily   Also states numbness to right pinky finger and left arm redness

## 2023-11-30 NOTE — ED Notes (Signed)
 Pt refused IV bigger than 22G, attempt at IV x 1 to RAC with no result.  IV too short, vein is deeper than IV needed. Guaze applied

## 2023-11-30 NOTE — ED Notes (Signed)
 Discharge paperwork reviewed entirely with patient, including follow up care. Pain was under control. The patient received instruction and coaching on their prescriptions, and all follow-up questions were answered.  Pt verbalized understanding as well as all parties involved. No questions or concerns voiced at the time of discharge. No acute distress noted.   Pt was wheeled out to the PVA in a wheelchair without incident.  Pt advised they will notify their PCP immediately.

## 2023-11-30 NOTE — ED Provider Notes (Signed)
 [place] EMERGENCY DEPARTMENT AT [facility] Provider Note   CSN: [id number] Arrival date & time: 11/30/23  1632     History  Chief Complaint  Patient presents with   Leg Swelling    Sierra Atkins is a 58 y.o. female.  Patient here with ongoing bilateral leg swelling it is causing her a lot of discomfort.  She had extensive workup here recently with labs and DVT.  She tried to follow-up with her primary care doctor to get some pain management taking care of but her doctor was sick.  She is unable to get a refill of her pain medicine.  Patient denies any fever or chills.  She states that Lasix  has been helping but she still has a lot of discomfort.  Denies any shortness of breath chest pain weakness numbness tingling.  History of lupus.  The history is provided by the patient.       Home Medications Prior to Admission medications   Medication Sig Start Date End Date Taking? Authorizing Provider  doxycycline  (VIBRAMYCIN ) 100 MG capsule Take 1 capsule (100 mg total) by mouth 2 (two) times daily. 11/30/23  Yes Jahlil Ziller, DO  oxyCODONE  (ROXICODONE ) 5 MG immediate release tablet Take 1 tablet (5 mg total) by mouth every 6 (six) hours as needed for up to 10 doses. 11/30/23  Yes Vernadette Stutsman, DO  amLODipine -valsartan  (EXFORGE ) 5-160 MG tablet Take 1 tablet by mouth daily. 05/25/23   Steinl, Kevin, MD  carvedilol  (COREG ) 6.25 MG tablet Take 6.25 mg by mouth 2 (two) times daily.    [provider]  cephALEXin  (KEFLEX ) 500 MG capsule Take 1 capsule (500 mg total) by mouth 4 (four) times daily. Patient not taking: Reported on 11/22/2023 05/25/23   Steinl, Kevin, MD  diclofenac  Sodium (VOLTAREN ) 1 % GEL Apply 4 g topically 4 (four) times daily as needed. 04/16/23   [provider]  fluticasone  (FLONASE ) 50 MCG/ACT nasal spray Place 2 sprays into both nostrils daily for 7 days. 06/29/23 11/22/23  Veta Palma, PA-C  furosemide  (LASIX ) 40 MG tablet Take 40 mg by  mouth 2 (two) times daily. 08/15/23   [provider]  furosemide  (LASIX ) 40 MG tablet Take 1 tablet (40 mg total) by mouth daily. 11/22/23   Horton, Charmaine FALCON, MD  gabapentin  (NEURONTIN ) 300 MG capsule Take 300 mg by mouth 3 (three) times daily. 12/21/22   [provider]  hydrochlorothiazide  (HYDRODIURIL ) 25 MG tablet Take 1 tablet (25 mg total) by mouth daily for 14 days. Patient not taking: Reported on 11/22/2023 08/12/19 08/26/19  Windle Almarie ORN, PA-C  hydrochlorothiazide  (HYDRODIURIL ) 25 MG tablet Take 1 tablet (25 mg total) by mouth daily. Patient not taking: Reported on 11/22/2023 07/31/22   Tegeler, Lonni PARAS, MD  HYDROcodone -acetaminophen  (NORCO) 10-325 MG tablet Take 1 tablet by mouth every 6 (six) hours as needed.    [provider]  hydroxychloroquine  (PLAQUENIL ) 200 MG tablet Take by mouth 2 (two) times daily.      [provider]  loperamide  (IMODIUM ) 2 MG capsule Take 1 capsule (2 mg total) by mouth 4 (four) times daily as needed for diarrhea or loose stools. Patient not taking: Reported on 11/22/2023 08/13/22   Neldon Hamp RAMAN, PA  methocarbamol  (ROBAXIN ) 500 MG tablet Take 500 mg by mouth 4 (four) times daily. 08/06/23   [provider]  methotrexate  (RHEUMATREX) 2.5 MG tablet Take 15 mg by mouth. 04/23/23   [provider]  Multiple Vitamin (MULTIVITAMIN) capsule Take  1 capsule by mouth daily.      [provider]  Omega-3 Fatty Acids (FISH OIL) 1000 MG CAPS Take 2 capsules by mouth daily.    [provider]  potassium chloride  SA (KLOR-CON  M) 20 MEQ tablet Take 1 tablet (20 mEq total) by mouth daily for 6 days. Patient not taking: Reported on 11/22/2023 08/13/22 08/19/22  Neldon Hamp RAMAN, PA  predniSONE  (DELTASONE ) 20 MG tablet Take 20 mg by mouth daily with breakfast.    [provider]  Probiotic Product (PROBIOTIC PO) Take 1 capsule by mouth daily.    [provider]  spironolactone   (ALDACTONE ) 25 MG tablet Take 1 tablet by mouth daily. 08/15/23   [provider]  zolpidem  (AMBIEN ) 10 MG tablet Take 10 mg by mouth at bedtime as needed for sleep. For sleep.    [provider]      Allergies    Fentanyl, Codeine, and Morphine and codeine    Review of Systems   Review of Systems  Physical Exam Updated Vital Signs BP (!) 152/108 (BP Location: Left Arm)   Pulse 91   Temp 98.8 F (37.1 C)   Resp 20   Ht 5' 7 (1.702 m)   Wt 132.5 kg   SpO2 100%   BMI 45.75 kg/m  Physical Exam Vitals and nursing note reviewed.  Constitutional:      General: She is not in acute distress.    Appearance: She is well-developed. She is not ill-appearing.  HENT:     Head: Normocephalic and atraumatic.     Nose: Nose normal.     Mouth/Throat:     Mouth: Mucous membranes are moist.  Eyes:     Extraocular Movements: Extraocular movements intact.     Conjunctiva/sclera: Conjunctivae normal.     Pupils: Pupils are equal, round, and reactive to light.  Cardiovascular:     Rate and Rhythm: Normal rate and regular rhythm.     Pulses: Normal pulses.     Heart sounds: Normal heart sounds. No murmur heard. Pulmonary:     Effort: Pulmonary effort is normal. No respiratory distress.     Breath sounds: Normal breath sounds.  Abdominal:     Palpations: Abdomen is soft.     Tenderness: There is no abdominal tenderness.  Musculoskeletal:        General: No swelling.     Cervical back: Normal range of motion and neck supple.  Skin:    General: Skin is warm and dry.     Capillary Refill: Capillary refill takes less than 2 seconds.     Comments: Venous stasis changes to lower legs little bit of redness bilaterally no obvious major cellulitis  Neurological:     General: No focal deficit present.     Mental Status: She is alert.  Psychiatric:        Mood and Affect: Mood normal.     ED Results / Procedures / Treatments   Labs (all labs ordered are listed, but only  abnormal results are displayed) Labs Reviewed - No data to display  EKG None  Radiology No results found.  Procedures Procedures    Medications Ordered in ED Medications  oxyCODONE  (Oxy IR/ROXICODONE ) immediate release tablet 5 mg (5 mg Oral Given 11/30/23 1825)  doxycycline  (VIBRA -TABS) tablet 100 mg (100 mg Oral Given 11/30/23 1825)    ED Course/ Medical Decision Making/ A&P  Medical Decision Making Risk Prescription drug management.   Oswald Hake is here with ongoing leg swelling and pain.  History of lupus.  Unremarkable vitals.  No fever.  Had DVT study a couple days ago that was unremarkable.  Lab work that was unremarkable including BNP.  I have no concern for heart failure volume overload or renal failure or other acute process at this time.  She feels like her legs are pretty stable as well but she was supposed to see her primary care doctor today for pain medication eval but her doctor was sick.  Not sure when she is able to get another appointment.  She is got good pulses on exam.  She is got a little bit of redness to the lower portions of each leg could be a very mild cellulitis and will treat with doxycycline  but overall I think that these are pretty chronic appearing legs without any acute major infectious process.  I think it is reasonable to put her on a course of oxycodone  and doxycycline  and have her follow-up with her primary care doctor.  At this time I do not think there is any emergent process.  She agrees with this as well and is happy with plan.  Patient discharged in good condition.  This chart was dictated using voice recognition software.  Despite best efforts to proofread,  errors can occur which can change the documentation meaning.         Final Clinical Impression(s) / ED Diagnoses Final diagnoses:  Peripheral edema  Cellulitis, unspecified cellulitis site    Rx / DC Orders ED Discharge Orders           Ordered    doxycycline  (VIBRAMYCIN ) 100 MG capsule  2 times daily        11/30/23 1856    oxyCODONE  (ROXICODONE ) 5 MG immediate release tablet  Every 6 hours PRN        11/30/23 1856              Ruthe Cornet, DO 11/30/23 1859

## 2023-11-30 NOTE — ED Notes (Signed)
 Pulled patient to triage for lab draw, refused arterial, refused venipuncture for labs.  Willing to wait for room.

## 2023-12-05 ENCOUNTER — Encounter (HOSPITAL_BASED_OUTPATIENT_CLINIC_OR_DEPARTMENT_OTHER): Payer: Self-pay | Admitting: Emergency Medicine

## 2023-12-05 ENCOUNTER — Emergency Department (HOSPITAL_BASED_OUTPATIENT_CLINIC_OR_DEPARTMENT_OTHER): Payer: Medicaid Other

## 2023-12-05 ENCOUNTER — Inpatient Hospital Stay (HOSPITAL_BASED_OUTPATIENT_CLINIC_OR_DEPARTMENT_OTHER)
Admission: EM | Admit: 2023-12-05 | Discharge: 2023-12-16 | DRG: 603 | Disposition: A | Discharge status: Home Health | Payer: Medicaid Other | Attending: Internal Medicine | Admitting: Internal Medicine

## 2023-12-05 ENCOUNTER — Other Ambulatory Visit: Payer: Self-pay

## 2023-12-05 DIAGNOSIS — I10 Essential (primary) hypertension: Secondary | ICD-10-CM | POA: Diagnosis present

## 2023-12-05 DIAGNOSIS — Z90710 Acquired absence of both cervix and uterus: Secondary | ICD-10-CM

## 2023-12-05 DIAGNOSIS — Z79899 Other long term (current) drug therapy: Secondary | ICD-10-CM

## 2023-12-05 DIAGNOSIS — R5381 Other malaise: Secondary | ICD-10-CM | POA: Diagnosis present

## 2023-12-05 DIAGNOSIS — F17200 Nicotine dependence, unspecified, uncomplicated: Secondary | ICD-10-CM | POA: Diagnosis present

## 2023-12-05 DIAGNOSIS — R000 Tachycardia, unspecified: Secondary | ICD-10-CM | POA: Diagnosis present

## 2023-12-05 DIAGNOSIS — I671 Cerebral aneurysm, nonruptured: Secondary | ICD-10-CM | POA: Diagnosis present

## 2023-12-05 DIAGNOSIS — I110 Hypertensive heart disease with heart failure: Secondary | ICD-10-CM | POA: Diagnosis present

## 2023-12-05 DIAGNOSIS — Z751 Person awaiting admission to adequate facility elsewhere: Secondary | ICD-10-CM

## 2023-12-05 DIAGNOSIS — Z885 Allergy status to narcotic agent status: Secondary | ICD-10-CM

## 2023-12-05 DIAGNOSIS — L03116 Cellulitis of left lower limb: Principal | ICD-10-CM | POA: Diagnosis present

## 2023-12-05 DIAGNOSIS — Z79631 Long term (current) use of antimetabolite agent: Secondary | ICD-10-CM

## 2023-12-05 DIAGNOSIS — G629 Polyneuropathy, unspecified: Secondary | ICD-10-CM | POA: Diagnosis present

## 2023-12-05 DIAGNOSIS — M160 Bilateral primary osteoarthritis of hip: Secondary | ICD-10-CM | POA: Diagnosis present

## 2023-12-05 DIAGNOSIS — R059 Cough, unspecified: Secondary | ICD-10-CM | POA: Diagnosis present

## 2023-12-05 DIAGNOSIS — Z7952 Long term (current) use of systemic steroids: Secondary | ICD-10-CM

## 2023-12-05 DIAGNOSIS — R112 Nausea with vomiting, unspecified: Secondary | ICD-10-CM | POA: Diagnosis not present

## 2023-12-05 DIAGNOSIS — Z8269 Family history of other diseases of the musculoskeletal system and connective tissue: Secondary | ICD-10-CM

## 2023-12-05 DIAGNOSIS — I878 Other specified disorders of veins: Secondary | ICD-10-CM | POA: Diagnosis present

## 2023-12-05 DIAGNOSIS — G8929 Other chronic pain: Secondary | ICD-10-CM | POA: Diagnosis present

## 2023-12-05 DIAGNOSIS — M329 Systemic lupus erythematosus, unspecified: Secondary | ICD-10-CM | POA: Diagnosis present

## 2023-12-05 DIAGNOSIS — E876 Hypokalemia: Secondary | ICD-10-CM | POA: Diagnosis present

## 2023-12-05 DIAGNOSIS — Z8249 Family history of ischemic heart disease and other diseases of the circulatory system: Secondary | ICD-10-CM

## 2023-12-05 DIAGNOSIS — E66813 Obesity, class 3: Secondary | ICD-10-CM | POA: Diagnosis present

## 2023-12-05 DIAGNOSIS — Z96652 Presence of left artificial knee joint: Secondary | ICD-10-CM | POA: Diagnosis present

## 2023-12-05 DIAGNOSIS — I5032 Chronic diastolic (congestive) heart failure: Secondary | ICD-10-CM | POA: Diagnosis present

## 2023-12-05 DIAGNOSIS — M1990 Unspecified osteoarthritis, unspecified site: Secondary | ICD-10-CM | POA: Diagnosis present

## 2023-12-05 DIAGNOSIS — I2510 Atherosclerotic heart disease of native coronary artery without angina pectoris: Secondary | ICD-10-CM | POA: Diagnosis present

## 2023-12-05 DIAGNOSIS — Z6841 Body Mass Index (BMI) 40.0 and over, adult: Secondary | ICD-10-CM

## 2023-12-05 DIAGNOSIS — Z955 Presence of coronary angioplasty implant and graft: Secondary | ICD-10-CM

## 2023-12-05 DIAGNOSIS — Z982 Presence of cerebrospinal fluid drainage device: Secondary | ICD-10-CM

## 2023-12-05 DIAGNOSIS — Z86011 Personal history of benign neoplasm of the brain: Secondary | ICD-10-CM

## 2023-12-05 DIAGNOSIS — Z79891 Long term (current) use of opiate analgesic: Secondary | ICD-10-CM

## 2023-12-05 DIAGNOSIS — L03115 Cellulitis of right lower limb: Secondary | ICD-10-CM | POA: Diagnosis present

## 2023-12-05 LAB — CBC WITH DIFFERENTIAL/PLATELET
Abs Immature Granulocytes: 0.02 10*3/uL (ref 0.00–0.07)
Basophils Absolute: 0.1 10*3/uL (ref 0.0–0.1)
Basophils Relative: 1 %
Eosinophils Absolute: 0.3 10*3/uL (ref 0.0–0.5)
Eosinophils Relative: 3 %
HCT: 36.2 % (ref 36.0–46.0)
Hemoglobin: 11.9 g/dL — ABNORMAL LOW (ref 12.0–15.0)
Immature Granulocytes: 0 %
Lymphocytes Relative: 33 %
Lymphs Abs: 3.1 10*3/uL (ref 0.7–4.0)
MCH: 32.1 pg (ref 26.0–34.0)
MCHC: 32.9 g/dL (ref 30.0–36.0)
MCV: 97.6 fL (ref 80.0–100.0)
Monocytes Absolute: 0.7 10*3/uL (ref 0.1–1.0)
Monocytes Relative: 8 %
Neutro Abs: 5.1 10*3/uL (ref 1.7–7.7)
Neutrophils Relative %: 55 %
Platelets: 358 10*3/uL (ref 150–400)
RBC: 3.71 MIL/uL — ABNORMAL LOW (ref 3.87–5.11)
RDW: 13.6 % (ref 11.5–15.5)
WBC: 9.3 10*3/uL (ref 4.0–10.5)
nRBC: 0 % (ref 0.0–0.2)

## 2023-12-05 LAB — URINALYSIS, ROUTINE W REFLEX MICROSCOPIC
Bilirubin Urine: NEGATIVE
Glucose, UA: NEGATIVE mg/dL
Hgb urine dipstick: NEGATIVE
Ketones, ur: NEGATIVE mg/dL
Nitrite: NEGATIVE
Protein, ur: NEGATIVE mg/dL
Specific Gravity, Urine: 1.025 (ref 1.005–1.030)
pH: 6.5 (ref 5.0–8.0)

## 2023-12-05 LAB — COMPREHENSIVE METABOLIC PANEL
ALT: 14 U/L (ref 0–44)
AST: 15 U/L (ref 15–41)
Albumin: 3.5 g/dL (ref 3.5–5.0)
Alkaline Phosphatase: 70 U/L (ref 38–126)
Anion gap: 10 (ref 5–15)
BUN: 12 mg/dL (ref 6–20)
CO2: 24 mmol/L (ref 22–32)
Calcium: 9.1 mg/dL (ref 8.9–10.3)
Chloride: 107 mmol/L (ref 98–111)
Creatinine, Ser: 0.68 mg/dL (ref 0.44–1.00)
GFR, Estimated: >60 mL/min (ref >60)
Glucose, Bld: 87 mg/dL (ref 70–99)
Potassium: 3.2 mmol/L — ABNORMAL LOW (ref 3.5–5.1)
Sodium: 141 mmol/L (ref 135–145)
Total Bilirubin: 0.2 mg/dL (ref 0.0–1.2)
Total Protein: 7.4 g/dL (ref 6.5–8.1)

## 2023-12-05 LAB — URINALYSIS, MICROSCOPIC (REFLEX)

## 2023-12-05 LAB — LACTIC ACID, PLASMA: Lactic Acid, Venous: 0.9 mmol/L (ref 0.5–1.9)

## 2023-12-05 LAB — PROTIME-INR
INR: 0.9 (ref 0.8–1.2)
Prothrombin Time: 12.8 s (ref 11.4–15.2)

## 2023-12-05 MED ORDER — DIPHENHYDRAMINE HCL 50 MG/ML IJ SOLN
INTRAMUSCULAR | Status: AC
  Filled 2023-12-05: qty 1

## 2023-12-05 MED ORDER — FUROSEMIDE 40 MG PO TABS
40.0000 mg | ORAL_TABLET | Freq: Once | ORAL | Status: AC
  Administered 2023-12-05: 40 mg via ORAL
  Filled 2023-12-05: qty 1

## 2023-12-05 MED ORDER — HYDROMORPHONE HCL 1 MG/ML IJ SOLN
1.0000 mg | Freq: Once | INTRAMUSCULAR | Status: AC
  Administered 2023-12-05: 1 mg via INTRAVENOUS
  Filled 2023-12-05: qty 1

## 2023-12-05 MED ORDER — VANCOMYCIN HCL IN DEXTROSE 1-5 GM/200ML-% IV SOLN
1000.0000 mg | Freq: Once | INTRAVENOUS | Status: AC
  Administered 2023-12-05: 1000 mg via INTRAVENOUS
  Filled 2023-12-05: qty 200

## 2023-12-05 MED ORDER — DIPHENHYDRAMINE HCL 25 MG PO CAPS
25.0000 mg | ORAL_CAPSULE | Freq: Once | ORAL | Status: AC
  Administered 2023-12-05: 25 mg via ORAL
  Filled 2023-12-05: qty 1

## 2023-12-05 NOTE — ED Notes (Signed)
 Pt came in for evaluation, advised she has weeping wounds to her bilateral lower legs that worsened from prior evaluation in the ER. The pt advised she's been bed-bound and unable to walk due to the pain. No recent trauma. Hx of lasix  and cellulitis with peripheral edema.

## 2023-12-05 NOTE — ED Triage Notes (Signed)
 Pt has been treated for cellulitis of bilateral lower legs which is not responding to antibiotics.  Pt also c/o nausea and vomiting.  Pt c/o several open wounds to back of legs and some drainage.  Noted bilateral leg swelling with redness noted.

## 2023-12-05 NOTE — Progress Notes (Signed)
 ED Pharmacy Antibiotic Sign Off An antibiotic consult was received from an ED provider for vancomycin  per pharmacy dosing for cellulitis. A chart review was completed to assess appropriateness.   The following one time order(s) were placed:  Vancomycin  2g Iv x 1  Further antibiotic and/or antibiotic pharmacy consults should be ordered by the admitting provider if indicated.   Thank you for allowing pharmacy to be a part of this patient's care.   Dorn Poot, [facility]  Clinical Pharmacist 12/05/23 8:45 PM

## 2023-12-05 NOTE — ED Notes (Signed)
 Pt began having itching to site of vanc. No phlebitis visible, no redman syndrome or diffuse rash. Only irritation from patient scratching at the site. EDP aware and rate slowed down from 200 to 67gtt within guardrails of the pump. Benadryl requested.

## 2023-12-05 NOTE — ED Provider Notes (Signed)
 [facility]MENT AT [facility] Provider Note   CSN: [id number] Arrival date & time: 12/05/23  1629     History  Chief Complaint  Patient presents with   Recurrent Skin Infections    Sierra Atkins is a 58 y.o. female with a PMHx of lupus, HTN, arthritis, HTN, CAD s/p stent, benign brain tumor s/p craniotomy, who presents to the ED with a 3 week history of bilateral leg swelling and erythema. Pt was initially seen in the [facility] ED 12/30 where she was given 80 mg IV of Lasix  and sent home with 120 mg Lasix . She has been urinating well. She returned to ED 1/7 and was given Doxycycline  and oxycodone . Since then she has experienced worsening pain in her bilateral lower legs extremities and began to blister. Friday night she woke up and noticed one of her blisters had popped clear fluid. Patient reports it feels like my skin is being torn apart and that I am walking on glass even though it is carpet. She says the pain shoots up into her hip at times as well. Patient has been unable to walk and care for herself d/t the pain and has urinated on herself as a result. She vomited the oxycodone  and has taken Tylenol  for the pain since.  Patient denies any fevers but reports chills sporadically during the day and at night. She denies any chest pain, palpitations, or weakness in her lower extremities.   Additionally pt reports 3 days of productive cough (thick yellow) and SOB.   HPI     Home Medications Prior to Admission medications   Medication Sig Start Date End Date Taking? Authorizing Provider  amLODipine -valsartan  (EXFORGE ) 5-160 MG tablet Take 1 tablet by mouth daily. 05/25/23   Steinl, Kevin, MD  carvedilol  (COREG ) 6.25 MG tablet Take 6.25 mg by mouth 2 (two) times daily.    [provider]  cephALEXin  (KEFLEX ) 500 MG capsule Take 1 capsule (500 mg total) by mouth 4 (four) times daily. Patient not taking: Reported on 11/22/2023 05/25/23   Steinl, Kevin, MD   diclofenac  Sodium (VOLTAREN ) 1 % GEL Apply 4 g topically 4 (four) times daily as needed. 04/16/23   [provider]  doxycycline  (VIBRAMYCIN ) 100 MG capsule Take 1 capsule (100 mg total) by mouth 2 (two) times daily. 11/30/23   Curatolo, Adam, DO  fluticasone  (FLONASE ) 50 MCG/ACT nasal spray Place 2 sprays into both nostrils daily for 7 days. 06/29/23 11/22/23  Veta Palma, PA-C  furosemide  (LASIX ) 40 MG tablet Take 40 mg by mouth 2 (two) times daily. 08/15/23   [provider]  furosemide  (LASIX ) 40 MG tablet Take 1 tablet (40 mg total) by mouth daily. 11/22/23   Horton, Charmaine FALCON, MD  gabapentin  (NEURONTIN ) 300 MG capsule Take 300 mg by mouth 3 (three) times daily. 12/21/22   [provider]  hydrochlorothiazide  (HYDRODIURIL ) 25 MG tablet Take 1 tablet (25 mg total) by mouth daily for 14 days. Patient not taking: Reported on 11/22/2023 08/12/19 08/26/19  Windle Almarie ORN, PA-C  hydrochlorothiazide  (HYDRODIURIL ) 25 MG tablet Take 1 tablet (25 mg total) by mouth daily. Patient not taking: Reported on 11/22/2023 07/31/22   Tegeler, Lonni PARAS, MD  HYDROcodone -acetaminophen  (NORCO) 10-325 MG tablet Take 1 tablet by mouth every 6 (six) hours as needed.    [provider]  hydroxychloroquine  (PLAQUENIL ) 200 MG tablet Take by mouth 2 (two) times daily.      [provider]  loperamide  (IMODIUM ) 2 MG capsule  Take 1 capsule (2 mg total) by mouth 4 (four) times daily as needed for diarrhea or loose stools. Patient not taking: Reported on 11/22/2023 08/13/22   Neldon Hamp RAMAN, PA  methocarbamol  (ROBAXIN ) 500 MG tablet Take 500 mg by mouth 4 (four) times daily. 08/06/23   [provider]  methotrexate  (RHEUMATREX) 2.5 MG tablet Take 15 mg by mouth. 04/23/23   [provider]  Multiple Vitamin (MULTIVITAMIN) capsule Take 1 capsule by mouth daily.      [provider]  Omega-3 Fatty Acids (FISH OIL) 1000 MG CAPS Take 2 capsules by mouth  daily.    [provider]  oxyCODONE  (ROXICODONE ) 5 MG immediate release tablet Take 1 tablet (5 mg total) by mouth every 6 (six) hours as needed for up to 10 doses. 11/30/23   Curatolo, Adam, DO  potassium chloride  SA (KLOR-CON  M) 20 MEQ tablet Take 1 tablet (20 mEq total) by mouth daily for 6 days. Patient not taking: Reported on 11/22/2023 08/13/22 08/19/22  Neldon Hamp RAMAN, PA  predniSONE  (DELTASONE ) 20 MG tablet Take 20 mg by mouth daily with breakfast.    [provider]  Probiotic Product (PROBIOTIC PO) Take 1 capsule by mouth daily.    [provider]  spironolactone  (ALDACTONE ) 25 MG tablet Take 1 tablet by mouth daily. 08/15/23   [provider]  zolpidem  (AMBIEN ) 10 MG tablet Take 10 mg by mouth at bedtime as needed for sleep. For sleep.    [provider]      Allergies    Fentanyl, Codeine, and Morphine and codeine    Review of Systems   Review of Systems  Constitutional:  Positive for chills.  Respiratory:  Positive for cough.   Cardiovascular:  Positive for leg swelling.  Musculoskeletal:  Positive for myalgias.  Skin:  Positive for color change.  All other systems reviewed and are negative.   Physical Exam Updated Vital Signs BP (!) 147/77   Pulse 84   Temp 98.7 F (37.1 C)   Resp 18   Ht 5' 7 (1.702 m)   Wt 126.1 kg   SpO2 100%   BMI 43.54 kg/m  Physical Exam Vitals and nursing note reviewed.  Constitutional:      Appearance: Normal appearance.  HENT:     Head: Normocephalic and atraumatic.  Eyes:     Conjunctiva/sclera: Conjunctivae normal.  Cardiovascular:     Rate and Rhythm: Normal rate and regular rhythm.  Pulmonary:     Effort: Pulmonary effort is normal. No respiratory distress.     Breath sounds: Normal breath sounds.  Abdominal:     General: There is no distension.     Palpations: Abdomen is soft.     Tenderness: There is no abdominal tenderness.  Skin:    General: Skin is warm and dry.      Capillary Refill: Capillary refill takes less than 2 seconds.     Comments: Bilateral lower leg redness, L > R with tracking, more anterior on the shins, with increased warmth. Some blisters on the left lateral leg  Neurological:     General: No focal deficit present.     Mental Status: She is alert.     ED Results / Procedures / Treatments   Labs (all labs ordered are listed, but only abnormal results are displayed) Labs Reviewed  COMPREHENSIVE METABOLIC PANEL - Abnormal; Notable for the following components:      Result Value   Potassium 3.2 (*)  All other components within normal limits  CBC WITH DIFFERENTIAL/PLATELET - Abnormal; Notable for the following components:   RBC 3.71 (*)    Hemoglobin 11.9 (*)    All other components within normal limits  URINALYSIS, ROUTINE W REFLEX MICROSCOPIC - Abnormal; Notable for the following components:   APPearance CLOUDY (*)    Leukocytes,Ua TRACE (*)    All other components within normal limits  URINALYSIS, MICROSCOPIC (REFLEX) - Abnormal; Notable for the following components:   Bacteria, UA MANY (*)    All other components within normal limits  CULTURE, BLOOD (ROUTINE X 2)  CULTURE, BLOOD (ROUTINE X 2)  LACTIC ACID, PLASMA  PROTIME-INR  LACTIC ACID, PLASMA    EKG None  Radiology DG Tibia/Fibula Left Result Date: 12/05/2023 CLINICAL DATA:  Cutaneous infection/cellulitis. Worsening lower extremity swelling and body aches. EXAM: LEFT TIBIA AND FIBULA - 2 VIEW COMPARISON:  None Available. FINDINGS: Total knee prosthesis observed, no abnormal lucency around the components of the prosthesis to suggest loosening or infection. Infiltrative subcutaneous edema is present throughout the calf. No abnormal gas in the soft tissues. IMPRESSION: 1. Infiltrative subcutaneous edema throughout the calf. No abnormal gas in the soft tissues. 2. 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 Electronically Signed   By: Ryan Salvage M.D.   On:  12/05/2023 21:25   DG Chest 2 View Result Date: 12/05/2023 CLINICAL DATA:  Worsening bilateral lower leg cellulitis. Body aches. EXAM: CHEST - 2 VIEW COMPARISON:  Chest x-ray dated November 22, 2023. FINDINGS: Stable cardiomediastinal silhouette, accentuated by technique. Mild interstitial prominence. No focal consolidation, pleural effusion, or pneumothorax. No acute osseous abnormality. IMPRESSION: 1. Mild interstitial prominence may be smoking related or reflect interstitial edema. Electronically Signed   By: Elsie ONEIDA Shoulder M.D.   On: 12/05/2023 18:39    Procedures .Ultrasound ED Peripheral IV (Provider)  Date/Time: 12/05/2023 7:57 PM  Performed by: Corie Friddie ONEIDA, PA-C Authorized by: Cathy Crounse T, PA-C   Procedure details:    Indications: poor IV access     Skin Prep: chlorhexidine gluconate     Location:  Right AC   Angiocath:  20 G   Bedside Ultrasound Guided: Yes     Images: not archived     Patient tolerated procedure without complications: Yes     Dressing applied: Yes       Medications Ordered in ED Medications  vancomycin  (VANCOCIN ) IVPB 1000 mg/200 mL premix (0 mg Intravenous Stopped 12/05/23 2230)    Followed by  vancomycin  (VANCOCIN ) IVPB 1000 mg/200 mL premix (1,000 mg Intravenous New Bag/Given 12/05/23 2230)  HYDROmorphone  (DILAUDID ) injection 1 mg (1 mg Intravenous Given 12/05/23 2045)  furosemide  (LASIX ) tablet 40 mg (40 mg Oral Given 12/05/23 2048)    ED Course/ Medical Decision Making/ A&P                                 Medical Decision Making Amount and/or Complexity of Data Reviewed Labs: ordered. Radiology: ordered.  This patient is a 58 y.o. female  who presents to the ED for concern of lower leg redness and swelling.   Differential diagnoses prior to evaluation: The emergent differential diagnosis includes, but is not limited to,  cellulitis, peripheral edema, chronic dermatitis. This is not an exhaustive differential.   Past Medical  History / Co-morbidities / Social History: lupus, HTN, arthritis, HTN, CAD s/p stent, benign brain tumor s/p craniotom  Additional history: Chart reviewed. Pertinent results  include: Reviewed ED visit record from 1/7, and hospital admission record for cellulitis in September 2024  Physical Exam: Physical exam performed. The pertinent findings include: Hypertensive, otherwise normal vital signs.  Bilateral lower legs with generalized edema and some overlying erythema, worse on the anterior shins.  Some blisters noted to the left lateral calf  Lab Tests/Imaging studies: I personally interpreted labs/imaging and the pertinent results include: No leukocytosis, hemoglobin stable.  Potassium 3.2, otherwise CMP unremarkable.  UA with trace leukocytes, many bacteria, less than 5 WBCs.  Chest x-ray ordered in the setting of cough, that showed mild interstitial prominence.  No focal consolidation.  Left tibia-fibula x-ray shows subcutaneous edema throughout the calf, with no abnormal gas.  I agree with the radiologist interpretation.  Medications: I ordered medication including vancomycin  per pharmacy, Lasix  p.o., Dilaudid  for pain.  I have reviewed the patients home medicines and have made adjustments as needed.  Consultations obtained: I consulted with hospitalist Dr Shona who recommended: admission to the hospital  Disposition: After consideration of the diagnostic results and the patients response to treatment, I feel that patient is requiring admission for IV antibiotics in the setting of cellulitis having failed outpatient antibiotics. Not septic.   Final Clinical Impression(s) / ED Diagnoses Final diagnoses:  Cellulitis of left lower extremity    Rx / DC Orders ED Discharge Orders     None      Portions of this report may have been transcribed using voice recognition software. Every effort was made to ensure accuracy; however, inadvertent computerized transcription errors may be  present.    Heath Badon T, PA-C 12/05/23 2239    Albertina Dixon, MD 12/06/23 1328

## 2023-12-06 ENCOUNTER — Encounter (HOSPITAL_COMMUNITY): Payer: Self-pay | Admitting: Internal Medicine

## 2023-12-06 ENCOUNTER — Inpatient Hospital Stay (HOSPITAL_COMMUNITY): Payer: Medicaid Other

## 2023-12-06 DIAGNOSIS — Z96652 Presence of left artificial knee joint: Secondary | ICD-10-CM | POA: Diagnosis present

## 2023-12-06 DIAGNOSIS — E66813 Obesity, class 3: Secondary | ICD-10-CM | POA: Diagnosis present

## 2023-12-06 DIAGNOSIS — I110 Hypertensive heart disease with heart failure: Secondary | ICD-10-CM | POA: Diagnosis present

## 2023-12-06 DIAGNOSIS — I5031 Acute diastolic (congestive) heart failure: Secondary | ICD-10-CM

## 2023-12-06 DIAGNOSIS — Z8249 Family history of ischemic heart disease and other diseases of the circulatory system: Secondary | ICD-10-CM | POA: Diagnosis not present

## 2023-12-06 DIAGNOSIS — M160 Bilateral primary osteoarthritis of hip: Secondary | ICD-10-CM | POA: Diagnosis present

## 2023-12-06 DIAGNOSIS — G8929 Other chronic pain: Secondary | ICD-10-CM | POA: Diagnosis present

## 2023-12-06 DIAGNOSIS — E876 Hypokalemia: Secondary | ICD-10-CM | POA: Diagnosis present

## 2023-12-06 DIAGNOSIS — Z79891 Long term (current) use of opiate analgesic: Secondary | ICD-10-CM | POA: Diagnosis not present

## 2023-12-06 DIAGNOSIS — I10 Essential (primary) hypertension: Secondary | ICD-10-CM | POA: Diagnosis present

## 2023-12-06 DIAGNOSIS — Z86011 Personal history of benign neoplasm of the brain: Secondary | ICD-10-CM | POA: Diagnosis not present

## 2023-12-06 DIAGNOSIS — Z90710 Acquired absence of both cervix and uterus: Secondary | ICD-10-CM | POA: Diagnosis not present

## 2023-12-06 DIAGNOSIS — G629 Polyneuropathy, unspecified: Secondary | ICD-10-CM | POA: Diagnosis present

## 2023-12-06 DIAGNOSIS — M329 Systemic lupus erythematosus, unspecified: Secondary | ICD-10-CM | POA: Diagnosis present

## 2023-12-06 DIAGNOSIS — Z751 Person awaiting admission to adequate facility elsewhere: Secondary | ICD-10-CM | POA: Diagnosis not present

## 2023-12-06 DIAGNOSIS — Z982 Presence of cerebrospinal fluid drainage device: Secondary | ICD-10-CM | POA: Diagnosis not present

## 2023-12-06 DIAGNOSIS — I671 Cerebral aneurysm, nonruptured: Secondary | ICD-10-CM | POA: Diagnosis present

## 2023-12-06 DIAGNOSIS — L539 Erythematous condition, unspecified: Secondary | ICD-10-CM | POA: Diagnosis present

## 2023-12-06 DIAGNOSIS — Z6841 Body Mass Index (BMI) 40.0 and over, adult: Secondary | ICD-10-CM | POA: Diagnosis not present

## 2023-12-06 DIAGNOSIS — I5032 Chronic diastolic (congestive) heart failure: Secondary | ICD-10-CM | POA: Diagnosis present

## 2023-12-06 DIAGNOSIS — Z8269 Family history of other diseases of the musculoskeletal system and connective tissue: Secondary | ICD-10-CM | POA: Diagnosis not present

## 2023-12-06 DIAGNOSIS — L03116 Cellulitis of left lower limb: Secondary | ICD-10-CM | POA: Diagnosis present

## 2023-12-06 DIAGNOSIS — I2510 Atherosclerotic heart disease of native coronary artery without angina pectoris: Secondary | ICD-10-CM | POA: Diagnosis present

## 2023-12-06 DIAGNOSIS — I878 Other specified disorders of veins: Secondary | ICD-10-CM | POA: Diagnosis present

## 2023-12-06 DIAGNOSIS — L03115 Cellulitis of right lower limb: Secondary | ICD-10-CM | POA: Diagnosis present

## 2023-12-06 DIAGNOSIS — Z955 Presence of coronary angioplasty implant and graft: Secondary | ICD-10-CM | POA: Diagnosis not present

## 2023-12-06 LAB — BRAIN NATRIURETIC PEPTIDE: B Natriuretic Peptide: 27.1 pg/mL (ref 0.0–100.0)

## 2023-12-06 LAB — CBC WITH DIFFERENTIAL/PLATELET
Abs Immature Granulocytes: 0.03 10*3/uL (ref 0.00–0.07)
Basophils Absolute: 0 10*3/uL (ref 0.0–0.1)
Basophils Relative: 1 %
Eosinophils Absolute: 0.3 10*3/uL (ref 0.0–0.5)
Eosinophils Relative: 4 %
HCT: 32 % — ABNORMAL LOW (ref 36.0–46.0)
Hemoglobin: 10.9 g/dL — ABNORMAL LOW (ref 12.0–15.0)
Immature Granulocytes: 0 %
Lymphocytes Relative: 36 %
Lymphs Abs: 2.9 10*3/uL (ref 0.7–4.0)
MCH: 33.7 pg (ref 26.0–34.0)
MCHC: 34.1 g/dL (ref 30.0–36.0)
MCV: 99.1 fL (ref 80.0–100.0)
Monocytes Absolute: 0.7 10*3/uL (ref 0.1–1.0)
Monocytes Relative: 9 %
Neutro Abs: 4.1 10*3/uL (ref 1.7–7.7)
Neutrophils Relative %: 50 %
Platelets: 291 10*3/uL (ref 150–400)
RBC: 3.23 MIL/uL — ABNORMAL LOW (ref 3.87–5.11)
RDW: 13.8 % (ref 11.5–15.5)
WBC: 8.1 10*3/uL (ref 4.0–10.5)
nRBC: 0 % (ref 0.0–0.2)

## 2023-12-06 LAB — COMPREHENSIVE METABOLIC PANEL
ALT: 11 U/L (ref 0–44)
AST: 13 U/L — ABNORMAL LOW (ref 15–41)
Albumin: 3.2 g/dL — ABNORMAL LOW (ref 3.5–5.0)
Alkaline Phosphatase: 62 U/L (ref 38–126)
Anion gap: 5 (ref 5–15)
BUN: 12 mg/dL (ref 6–20)
CO2: 24 mmol/L (ref 22–32)
Calcium: 8.4 mg/dL — ABNORMAL LOW (ref 8.9–10.3)
Chloride: 109 mmol/L (ref 98–111)
Creatinine, Ser: 0.51 mg/dL (ref 0.44–1.00)
GFR, Estimated: >60 mL/min (ref >60)
Glucose, Bld: 98 mg/dL (ref 70–99)
Potassium: 3 mmol/L — ABNORMAL LOW (ref 3.5–5.1)
Sodium: 138 mmol/L (ref 135–145)
Total Bilirubin: 0.5 mg/dL (ref 0.0–1.2)
Total Protein: 6.6 g/dL (ref 6.5–8.1)

## 2023-12-06 LAB — MAGNESIUM: Magnesium: 2 mg/dL (ref 1.7–2.4)

## 2023-12-06 LAB — ECHOCARDIOGRAM COMPLETE
AV Peak grad: 21.3 mm[Hg]
Ao pk vel: 2.31 m/s
Height: 67 in
S' Lateral: 2.3 cm
Weight: 4448 [oz_av]

## 2023-12-06 MED ORDER — AMLODIPINE BESYLATE 5 MG PO TABS: 5.0000 mg | ORAL_TABLET | Freq: Every day | ORAL | Status: DC

## 2023-12-06 MED ORDER — POTASSIUM CHLORIDE CRYS ER 20 MEQ PO TBCR
40.0000 meq | EXTENDED_RELEASE_TABLET | Freq: Once | ORAL | Status: AC
Start: 2023-12-06 — End: 2023-12-06
  Administered 2023-12-06: 40 meq via ORAL
  Filled 2023-12-06: qty 2

## 2023-12-06 MED ORDER — POTASSIUM CHLORIDE CRYS ER 20 MEQ PO TBCR
40.0000 meq | EXTENDED_RELEASE_TABLET | Freq: Once | ORAL | Status: AC
  Administered 2023-12-06: 40 meq via ORAL
  Filled 2023-12-06: qty 2

## 2023-12-06 MED ORDER — IRBESARTAN 150 MG PO TABS: 150.0000 mg | ORAL_TABLET | Freq: Every day | ORAL | Status: DC

## 2023-12-06 MED ORDER — HALOPERIDOL LACTATE 5 MG/ML IJ SOLN
5.0000 mg | Freq: Once | INTRAMUSCULAR | Status: AC
  Administered 2023-12-06: 5 mg via INTRAVENOUS
  Filled 2023-12-06: qty 1

## 2023-12-06 MED ORDER — KETOROLAC TROMETHAMINE 30 MG/ML IJ SOLN
30.0000 mg | Freq: Once | INTRAMUSCULAR | Status: AC
  Administered 2023-12-06: 30 mg via INTRAVENOUS
  Filled 2023-12-06: qty 1

## 2023-12-06 MED ORDER — HYDROMORPHONE HCL 1 MG/ML IJ SOLN: 0.5000 mg | INTRAMUSCULAR | Status: DC | PRN

## 2023-12-06 MED ORDER — SODIUM CHLORIDE 0.9 % IV SOLN
1.0000 g | INTRAVENOUS | Status: DC
  Administered 2023-12-06 – 2023-12-08 (×3): 1 g via INTRAVENOUS
  Filled 2023-12-06 (×3): qty 10

## 2023-12-06 MED ORDER — NALOXONE HCL 0.4 MG/ML IJ SOLN: 0.4000 mg | INTRAMUSCULAR | Status: DC | PRN

## 2023-12-06 MED ORDER — HYDROMORPHONE HCL 1 MG/ML IJ SOLN
1.0000 mg | INTRAMUSCULAR | Status: DC | PRN
  Administered 2023-12-06 – 2023-12-10 (×19): 1 mg via INTRAVENOUS
  Filled 2023-12-06 (×20): qty 1

## 2023-12-06 MED ORDER — ONDANSETRON HCL 4 MG/2ML IJ SOLN
4.0000 mg | Freq: Four times a day (QID) | INTRAMUSCULAR | Status: DC | PRN
  Administered 2023-12-12 – 2023-12-16 (×9): 4 mg via INTRAVENOUS
  Filled 2023-12-06 (×10): qty 2

## 2023-12-06 MED ORDER — HYDROXYCHLOROQUINE SULFATE 200 MG PO TABS
200.0000 mg | ORAL_TABLET | Freq: Two times a day (BID) | ORAL | Status: DC
  Administered 2023-12-06 – 2023-12-11 (×8): 200 mg via ORAL
  Filled 2023-12-06 (×13): qty 1

## 2023-12-06 MED ORDER — OXYCODONE HCL 5 MG PO TABS: 5.0000 mg | ORAL_TABLET | ORAL | Status: DC

## 2023-12-06 MED ORDER — CARVEDILOL 6.25 MG PO TABS: 6.2500 mg | ORAL_TABLET | Freq: Two times a day (BID) | ORAL | Status: DC

## 2023-12-06 MED ORDER — AMLODIPINE BESYLATE-VALSARTAN 5-160 MG PO TABS: 1.0000 | ORAL_TABLET | Freq: Every day | ORAL | Status: DC

## 2023-12-06 MED ORDER — GABAPENTIN 100 MG PO CAPS
300.0000 mg | ORAL_CAPSULE | Freq: Three times a day (TID) | ORAL | Status: DC
  Administered 2023-12-06 – 2023-12-16 (×32): 300 mg via ORAL
  Filled 2023-12-06 (×32): qty 3

## 2023-12-06 MED ORDER — PERFLUTREN LIPID MICROSPHERE
1.0000 mL | INTRAVENOUS | Status: AC | PRN
  Administered 2023-12-06: 2 mL via INTRAVENOUS
  Filled 2023-12-06: qty 10

## 2023-12-06 MED ORDER — ZOLPIDEM TARTRATE 5 MG PO TABS
5.0000 mg | ORAL_TABLET | Freq: Once | ORAL | Status: AC
  Administered 2023-12-06: 5 mg via ORAL
  Filled 2023-12-06: qty 1

## 2023-12-06 MED ORDER — FUROSEMIDE 10 MG/ML IJ SOLN
40.0000 mg | Freq: Every day | INTRAMUSCULAR | Status: DC
Start: 2023-12-06 — End: 2023-12-08
  Administered 2023-12-06 – 2023-12-08 (×3): 40 mg via INTRAVENOUS
  Filled 2023-12-06 (×3): qty 4

## 2023-12-06 MED ORDER — MELATONIN 3 MG PO TABS
3.0000 mg | ORAL_TABLET | Freq: Every evening | ORAL | Status: DC | PRN
  Administered 2023-12-11: 3 mg via ORAL
  Filled 2023-12-06: qty 1

## 2023-12-06 MED ORDER — VANCOMYCIN HCL 10 G IV SOLR
2250.0000 mg | INTRAVENOUS | Status: DC
  Filled 2023-12-06: qty 22.5

## 2023-12-06 MED ORDER — ACETAMINOPHEN 650 MG RE SUPP: 650.0000 mg | Freq: Four times a day (QID) | RECTAL | Status: DC | PRN

## 2023-12-06 MED ORDER — OXYCODONE HCL 5 MG PO TABS
10.0000 mg | ORAL_TABLET | ORAL | Status: AC
  Administered 2023-12-06: 10 mg via ORAL
  Filled 2023-12-06: qty 2

## 2023-12-06 MED ORDER — ACETAMINOPHEN 325 MG PO TABS
650.0000 mg | ORAL_TABLET | Freq: Four times a day (QID) | ORAL | Status: DC | PRN
  Administered 2023-12-08 – 2023-12-09 (×3): 650 mg via ORAL
  Filled 2023-12-06 (×4): qty 2

## 2023-12-06 MED ORDER — FUROSEMIDE 40 MG PO TABS
40.0000 mg | ORAL_TABLET | Freq: Every day | ORAL | Status: DC
  Administered 2023-12-06: 40 mg via ORAL
  Filled 2023-12-06: qty 1

## 2023-12-06 MED ORDER — VANCOMYCIN HCL IN DEXTROSE 1-5 GM/200ML-% IV SOLN
1000.0000 mg | Freq: Two times a day (BID) | INTRAVENOUS | Status: DC
  Administered 2023-12-06 – 2023-12-08 (×5): 1000 mg via INTRAVENOUS
  Filled 2023-12-06 (×5): qty 200

## 2023-12-06 NOTE — Progress Notes (Signed)
 Patient seen and examined personally, I reviewed the chart, history and physical and admission note, done by admitting physician this morning and agree with the same with following addendum.  Please refer to the morning admission note for more detailed plan of care.  Briefly,  58 y.o. female with medical history significant for essential hypertension on Exforge , Coreg , Lasix , HCTZ, peripheral polyneuropathy, lupus presenting with 10 days of progressive LUL EEL edema after left knee, swelling warmth.  She was diagnosed with left lower extremity cellulitis completed 5 days of oral doxycycline  from 11/30/2023 but without improvement.  However onset of symptoms was 3 weeks ago with worsening edema in the bilateral extremities had venous ultrasound on 12/13 negative for DVT. Seen at the med [facility], in the ED vitals fairly stable not hypoxic Labs with hypokalemia normal lactate WBC count.  Blood culture sent, imaging with chest x-ray-mild interstitial prominence -smoking-related versus edema.  Left leg x-ray with infiltrative subcutaneous edema throughout calf, total knee prosthesis.  Patient was given IV Lasix , pain medication Toradol  vancomycin  and admitted.  Seen this am Daughter on phone: Overnight afebrile BP stable. Labs with hypokalemia, BNP 27.1, CBC with mild anemia, UA stable blood culture in process C/o pain pain/swelling on both legs worse on LLE No chest pain fever chills  A/P Moderate severe cellulitis LLE Failed outpatient oral doxycycline : Duplex negative for DVT 12/30.  BNP normal.  Labs with normal WBC count.  Patient reports she had blisters on left lower leg which is resolved. blood culture in process, continue treatment for cellulitis-with IV antibiotics, elevate leg, pain control, I will change oral to IV Lasix .  SLE: Resume home med [place] Hypokalemia:Replaced HTN: BP is well-controlled.  Continue diuretics and resume multiple home meds-pending med rec Peripheral  neuropathy: Continue gabapentin 

## 2023-12-06 NOTE — H&P (Signed)
 History and Physical      Sierra Atkins FMW:[id number] DOB: [birth year] DOA: 12/05/2023; DOS: 12/06/2023  PCP: Default, Provider, MD  Patient coming from: home   I have personally briefly reviewed patient's old medical records in [facility]  Chief Complaint: Left lower extremity erythema  HPI: Sierra Atkins is a 58 y.o. female with medical history significant for essential hypertension, peripheral polyneuropathy, lupus, who is admitted to [facility] on 12/05/2023 by way of transfer from [facility] with cellulitis of the left lower extremity after presenting from home to the latter facility complaining of progressive left lower extremity erythema.   The patient reports approximately 10 days of progressive left lower extremity erythema at the level just proximal to the left ankle, with proximal extension terminating distal to the left knee, associated tenderness, increased warmth, swelling.  She reports a history of chronic peripheral polyneuropathy, but relative to this baseline, denies any acute paresthesias or numbness involving the left lower extremity.  No recent preceding trauma to the left lower extremity.  She also reports associated subjective fever and chills, in the absence of any associated full body rigors or generalized myalgias.  She conveys a history of left total knee arthroplasty, but denies any pain specific to the left knee, and denies any associated decline in range of motion involving the left knee.  Denies rash in any other site.  Denies any known history of underlying diabetes.  Not associate with any purulent drainage.  In the setting of the symptoms, she she was diagnosed as an outpatient with left lower extremity cellulitis, and completed a 5-day course of oral doxycycline  starting on 11/30/2023.  However, any in spite of good compliance and completing this outpatient oral antibiotic, she reports further progression of the above symptoms, prompting  her to present to [facility] emergency department this evening for further evaluation and management thereof.  Starting approximately 3 weeks ago, she noted some new onset worsening edema in the bilateral lower extremities, which prompted venous ultrasound of the bilateral lower extremities on 11/22/2023, which was negative for acute DVT.  She denies any associated shortness of breath orthopnea.  Denies any known history of underlying CHF.  Per initial chart review, no prior echocardiogram on file.  No recent chest pain.     [facility] ED Course:  Vital signs in the ED were notable for the following: Afebrile; initial heart rates in the 90s, subsequent decreasing into the 70s to 80s following initiation of IV antibiotics, as below; systolic pressures in the 120s to 160s; respiratory rate 18-20, oxygen saturation 96 to 100% on room air.  Labs were notable for the following: CMP notable for the following: Sodium 141, potassium 3.2, carbon 24, creatinine 0.68 compared to 0.93 on 11/21/2023, glucose 87, liver enzymes were within normal limits.  Lactic acid 0.9.  CBC notable for white cell count 9300, hemoglobin 11.9 associated [place]uraceq/[place] properties and nonelevated RDW, relative to most recent prior hemoglobin data point of 12.6 on 11/21/2023, platelet count 358.  INR 0.9.  Urinalysis showed no white blood cells, nitrate negative, no evidence of squamous epithelial cells, and was negative for protein.  Blood cultures x 2 were collected prior to initiation of IV antibiotics.  Per my interpretation, EKG in ED demonstrated the following: No EKG performed in the ED today.  Imaging in the ED, per corresponding formal radiology read, was notable for the following: 2 view chest x-ray showed mild interstitial prominence consistent  with her history of smoking, with radiology conveying that the differential also includes the possibility of interstitial edema.  Otherwise, this chest x-ray  shows no evidence of acute cardiopulmonary process, including no evidence of infiltrate, pulmonary edema, effusion, or pneumothorax.  Plan films of the left tib-fib show subcutaneous edema involving the left calf, without any evidence of subcutaneous gas, will also showing no evidence of acute bony abnormality, including no evidence of erosive lesion/osteomyelitis.  Plan for the left tib-fib also showed status post total knee arthroplasty, without any associated complicating features.  While in the ED, the following were administered: Benadryl  25 mg p.o. x 1 dose, Lasix  to 40 mg p.o. x 1 dose, Haldol  5 mg IV x 1 dose, Dilaudid  1 mg IV x 1 dose, Toradol  30 mg IV x 1 dose, oxycodone  10 mg p.o. x 1 dose, IV vancomycin .  Subsequently, the patient was admitted for further evaluation and management of presenting moderately severe cellulitis of the left lower extremity, with presenting labs also notable for hypokalemia.     Review of Systems: As per HPI otherwise 10 point review of systems negative.   Past Medical History:  Diagnosis Date   Arthritis    Back pain    Brain tumor (benign) (HCC)    Hypertension    Lupus     Past Surgical History:  Procedure Laterality Date   ABDOMINAL HYSTERECTOMY     BRAIN SURGERY     CORONARY ANGIOPLASTY WITH STENT PLACEMENT     JOINT REPLACEMENT     SHUNT REPLACEMENT      Social History:  reports that she has been smoking. She does not have any smokeless tobacco history on file. She reports that she does not drink alcohol and does not use drugs.   Allergies  Allergen Reactions   Fentanyl Shortness Of Breath   Codeine    Morphine And Codeine     Family History  Problem Relation Age of Onset   Hypertension Mother    Lupus Mother    Hypertension Father    Lupus Father     Family history reviewed and not pertinent    Prior to Admission medications   Medication Sig Start Date End Date Taking? Authorizing Provider  amLODipine -valsartan   (EXFORGE ) 5-160 MG tablet Take 1 tablet by mouth daily. 05/25/23   Steinl, Kevin, MD  carvedilol  (COREG ) 6.25 MG tablet Take 6.25 mg by mouth 2 (two) times daily.    [provider]  cephALEXin  (KEFLEX ) 500 MG capsule Take 1 capsule (500 mg total) by mouth 4 (four) times daily. Patient not taking: Reported on 11/22/2023 05/25/23   Bernard Drivers, MD  diclofenac  Sodium (VOLTAREN ) 1 % GEL Apply 4 g topically 4 (four) times daily as needed. 04/16/23   [provider]  doxycycline  (VIBRAMYCIN ) 100 MG capsule Take 1 capsule (100 mg total) by mouth 2 (two) times daily. 11/30/23   Curatolo, Adam, DO  fluticasone  (FLONASE ) 50 MCG/ACT nasal spray Place 2 sprays into both nostrils daily for 7 days. 06/29/23 11/22/23  Veta Palma, PA-C  furosemide  (LASIX ) 40 MG tablet Take 40 mg by mouth 2 (two) times daily. 08/15/23   [provider]  furosemide  (LASIX ) 40 MG tablet Take 1 tablet (40 mg total) by mouth daily. 11/22/23   Horton, Charmaine FALCON, MD  gabapentin  (NEURONTIN ) 300 MG capsule Take 300 mg by mouth 3 (three) times daily. 12/21/22   [provider]  hydrochlorothiazide  (HYDRODIURIL ) 25 MG tablet Take 1 tablet (25 mg total) by  mouth daily for 14 days. Patient not taking: Reported on 11/22/2023 08/12/19 08/26/19  Windle Almarie ORN, PA-C  hydrochlorothiazide  (HYDRODIURIL ) 25 MG tablet Take 1 tablet (25 mg total) by mouth daily. Patient not taking: Reported on 11/22/2023 07/31/22   Tegeler, Lonni PARAS, MD  HYDROcodone -acetaminophen  (NORCO) 10-325 MG tablet Take 1 tablet by mouth every 6 (six) hours as needed.    [provider]  hydroxychloroquine  (PLAQUENIL ) 200 MG tablet Take by mouth 2 (two) times daily.      [provider]  loperamide  (IMODIUM ) 2 MG capsule Take 1 capsule (2 mg total) by mouth 4 (four) times daily as needed for diarrhea or loose stools. Patient not taking: Reported on 11/22/2023 08/13/22   Neldon Hamp RAMAN, PA  methocarbamol  (ROBAXIN ) 500  MG tablet Take 500 mg by mouth 4 (four) times daily. 08/06/23   [provider]  methotrexate  (RHEUMATREX) 2.5 MG tablet Take 15 mg by mouth. 04/23/23   [provider]  Multiple Vitamin (MULTIVITAMIN) capsule Take 1 capsule by mouth daily.      [provider]  Omega-3 Fatty Acids (FISH OIL) 1000 MG CAPS Take 2 capsules by mouth daily.    [provider]  oxyCODONE  (ROXICODONE ) 5 MG immediate release tablet Take 1 tablet (5 mg total) by mouth every 6 (six) hours as needed for up to 10 doses. 11/30/23   Curatolo, Adam, DO  potassium chloride  SA (KLOR-CON  M) 20 MEQ tablet Take 1 tablet (20 mEq total) by mouth daily for 6 days. Patient not taking: Reported on 11/22/2023 08/13/22 08/19/22  Neldon Hamp RAMAN, PA  predniSONE  (DELTASONE ) 20 MG tablet Take 20 mg by mouth daily with breakfast.    [provider]  Probiotic Product (PROBIOTIC PO) Take 1 capsule by mouth daily.    [provider]  spironolactone  (ALDACTONE ) 25 MG tablet Take 1 tablet by mouth daily. 08/15/23   [provider]  zolpidem  (AMBIEN ) 10 MG tablet Take 10 mg by mouth at bedtime as needed for sleep. For sleep.    [provider]     Objective    Physical Exam: Vitals:   12/05/23 2245 12/05/23 2337 12/06/23 0416 12/06/23 0517  BP:   106/64 129/81  Pulse: 73  78 72  Resp:   20 18  Temp:  98.5 F (36.9 C) 98.6 F (37 C) 98.4 F (36.9 C)  TempSrc:  Oral Oral Oral  SpO2: 100%  96% 97%  Weight:      Height:        General: appears to be stated age; alert, oriented Skin: warm, erythema, increased warmth, tenderness, swelling associate with the left lower extremity proximal to the left ankle and extending proximally to the level just distal to the left knee, in the absence of any associated crepitus Head:  AT/[place] Mouth:  Oral mucosa membranes appear moist, normal dentition Neck: supple; trachea midline Heart:  RRR; did not appreciate any M/R/G Lungs: CTAB,  did not appreciate any wheezes, rales, or rhonchi Abdomen: + BS; soft, ND, NT Vascular: 2+ pedal pulses b/l; 2+ radial pulses b/l Extremities: no muscle wasting; left lower extremity erythema, tenderness, increased warmth, swelling, as further detailed above Neuro: strength and sensation intact in upper and lower extremities b/l     Labs on Admission: I have personally reviewed following labs and imaging studies  CBC: Recent Labs  Lab 12/05/23 1911  WBC 9.3  NEUTROABS 5.1  HGB 11.9*  HCT 36.2  MCV 97.6  PLT 358  Basic Metabolic Panel: Recent Labs  Lab 12/05/23 1911  NA 141  K 3.2*  CL 107  CO2 24  GLUCOSE 87  BUN 12  CREATININE 0.68  CALCIUM 9.1   GFR: Estimated Creatinine Clearance: 109.6 mL/min (by C-G formula based on SCr of 0.68 mg/dL). Liver Function Tests: Recent Labs  Lab 12/05/23 1911  AST 15  ALT 14  ALKPHOS 70  BILITOT 0.2  PROT 7.4  ALBUMIN 3.5   No results for input(s): LIPASE, AMYLASE in the last 168 hours. No results for input(s): AMMONIA in the last 168 hours. Coagulation Profile: Recent Labs  Lab 12/05/23 1911  INR 0.9   Cardiac Enzymes: No results for input(s): CKTOTAL, CKMB, CKMBINDEX, TROPONINI in the last 168 hours. BNP (last 3 results) No results for input(s): PROBNP in the last 8760 hours. HbA1C: No results for input(s): HGBA1C in the last 72 hours. CBG: No results for input(s): GLUCAP in the last 168 hours. Lipid Profile: No results for input(s): CHOL, HDL, LDLCALC, TRIG, CHOLHDL, LDLDIRECT in the last 72 hours. Thyroid Function Tests: No results for input(s): TSH, T4TOTAL, FREET4, T3FREE, THYROIDAB in the last 72 hours. Anemia Panel: No results for input(s): VITAMINB12, FOLATE, FERRITIN, TIBC, IRON, RETICCTPCT in the last 72 hours. Urine analysis:    Component Value Date/Time   COLORURINE YELLOW 12/05/2023 1724   APPEARANCEUR CLOUDY (A) 12/05/2023 1724   LABSPEC  1.025 12/05/2023 1724   PHURINE 6.5 12/05/2023 1724   GLUCOSEU NEGATIVE 12/05/2023 1724   HGBUR NEGATIVE 12/05/2023 1724   BILIRUBINUR NEGATIVE 12/05/2023 1724   KETONESUR NEGATIVE 12/05/2023 1724   PROTEINUR NEGATIVE 12/05/2023 1724   UROBILINOGEN 1.0 10/24/2011 0107   NITRITE NEGATIVE 12/05/2023 1724   LEUKOCYTESUR TRACE (A) 12/05/2023 1724    Radiological Exams on Admission: DG Tibia/Fibula Left Result Date: 12/05/2023 CLINICAL DATA:  Cutaneous infection/cellulitis. Worsening lower extremity swelling and body aches. EXAM: LEFT TIBIA AND FIBULA - 2 VIEW COMPARISON:  None Available. FINDINGS: Total knee prosthesis observed, no abnormal lucency around the components of the prosthesis to suggest loosening or infection. Infiltrative subcutaneous edema is present throughout the calf. No abnormal gas in the soft tissues. IMPRESSION: 1. Infiltrative subcutaneous edema throughout the calf. No abnormal gas in the soft tissues. 2. 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 Electronically Signed   By: Ryan Salvage M.D.   On: 12/05/2023 21:25   DG Chest 2 View Result Date: 12/05/2023 CLINICAL DATA:  Worsening bilateral lower leg cellulitis. Body aches. EXAM: CHEST - 2 VIEW COMPARISON:  Chest x-ray dated November 22, 2023. FINDINGS: Stable cardiomediastinal silhouette, accentuated by technique. Mild interstitial prominence. No focal consolidation, pleural effusion, or pneumothorax. No acute osseous abnormality. IMPRESSION: 1. Mild interstitial prominence may be smoking related or reflect interstitial edema. Electronically Signed   By: Elsie ONEIDA Shoulder M.D.   On: 12/05/2023 18:39      Assessment/Plan    Principal Problem:   Cellulitis of left lower extremity Active Problems:   Hypokalemia   Peripheral polyneuropathy   Essential hypertension   Lupus (systemic lupus erythematosus) (HCC)    #) Moderately severe cellulitis of left lower extremity: dx on basis of 10 days of left  lower extremity erythema associated with tenderness, increased warmth to the touch, swelling with imaging in the form of plain films of the left tib-fib demonstrating evidence of subcutaneous edema consistent with cellulitis, will demonstrating no evidence of subcutaneous gas or any evidence of acute bony abnormality, including no evidence of bony demineralization to suggest underlying osteomyelitis.  No evidence of crepitus on exam or evidence of subcutaneous gas on imaging to suggest necrotizing fasciitis.  Relative to her chronic peripheral polyneuropathy, associated extremity is found to be neurovascularly intact.   Of note, the patient has a history of left total knee arthroplasty, there is no radiographic or physical exam evidence to suggest a septic left knee at this time, noting preservation of left knee range of motion.  In terms of potential predisposing factors, there may be a contribution from  recent venous stasis given patient's report of preceding development of edema in the bilateral lower extremities, left greater than right.  She has no known history of heart failure, but chart review reveals no prior echocardiogram results.  Will pursue echocardiogram to further evaluate.  It is noted that her urinalysis shows no evidence of protein, rendering a nephrotic process to be less likely.  Additionally, no known history of underlying liver disease, and presenting elevated INR demonstrates preserved hepatic function.  As noted above, she recently underwent venous ultrasound bilateral lower extremities, which showed no evidence of acute DVT.  No recent trauma to the left lower extremity and she denies any known history of diabetes.  Presentation is associated with the presence of 1 SIRS criteria in the form of mild tachycardia. therefore presentation meets criteria for patient's cellulitis to be considered moderate in severity given the presence of 1 SIRS criteria . Consequently, we will proceed with  IV vancomycin  and Rocephin .  She does have a history of immunocompromisation noting the patient is on Plaquenil  in the context of a history of lupus.  Overall, in the absence of objective fever and in the absence of leukocytosis, SIRS criteria are not met for sepsis at this time.  No evidence of associated hypotension.  Blood cultures x 2 were collected [facility] earlier this evening followed by initiation of IV vancomycin .  Lactic acid nonelevated.   Plan: Abx in form of IV vancomycin  and Rocephin , as above. Follow for results of blood cultures x2 collected today. repeat CBC with diff in AM. I have placed nursing communication orders asking that current distribution of erythema be outlined, and that affected extremity be elevated to help decrease associated swelling.  Prn IV Dilaudid .  Check echocardiogram, BNP.  Continue home Lasix .  Incentive spirometry.                    #) Hypokalemia: presenting potassium level noted to be 3.2.    Plan: monitor on tele. KCl  40 meq p.o. x 1 dose now. CMP, mag level in the AM.                   #) Essential Hypertension: documented h/o such, with outpatient antihypertensive regimen including Lasix  40 mg p.o. daily, spironolactone , Norvasc , valsartan , Coreg .  SBP's in the ED today: Initially in the 160s, subsequently trending down to the low 100s to 120s mmHg. in the setting of her presenting infection and interval trending down in blood pressure, will hold home antihypertensive medications for now with the exception of resumption of her home Lasix .  Plan: Close monitoring of subsequent BP via routine VS. hold home Coreg , valsartan , Norvasc , spironolactone  for now.  Resume home Lasix .  Monitor strict I's and O's and daily weights.                 #) History of peripheral polyneuropathy: Documented history of such, on gabapentin  at home, in the absence of any known history of  underlying diabetes.  Plan:  Resume outpatient gabapentin .               #) History of lupus: Documented history of such, for which she is on Plaquenil  as an outpatient.  Plan: Continue outpatient Plaquenil .      DVT prophylaxis: SCD on right  Code Status: Full code Family Communication: none Disposition Plan: Per Rounding Team Consults called: none;  Admission status: Inpatient    I SPENT GREATER THAN 75  MINUTES IN CLINICAL CARE TIME/MEDICAL DECISION-MAKING IN COMPLETING THIS ADMISSION.     Eva NOVAK Kyllie Pettijohn DO [facility] From 7PM - 7AM   12/06/2023, 5:49 AM

## 2023-12-06 NOTE — ED Notes (Signed)
 Attempted USIV again without success. Both veins extravasated immediately upon attempted cannulation. Deferred to EDP on site for followup. No IV access at this time, primary RN aware.

## 2023-12-06 NOTE — ED Notes (Signed)
 [facility] called for transport.

## 2023-12-06 NOTE — Progress Notes (Signed)
 Pharmacy Antibiotic Note  Sierra Atkins is a 58 y.o. female admitted on 12/05/2023 with a PMHx of lupus, HTN, arthritis, HTN, CAD s/p stent, benign brain tumor s/p craniotomy, who presents to the ED with a 3 week history of bilateral leg swelling and erythema .  Pharmacy has been consulted for vancomycin  dosing.  Loading dose given in the ED  Plan: Vancomycin  2250mg  IV q24h (AUC 520.7, Scr 0.8) Follow renal function, cultures and clinical course  Height: 5' 7 (170.2 cm) Weight: 126.1 kg (278 lb) IBW/kg (Calculated) : 61.6  Temp (24hrs), Avg:98.6 F (37 C), Min:98.4 F (36.9 C), Max:98.7 F (37.1 C)  Recent Labs  Lab 12/05/23 1911  WBC 9.3  CREATININE 0.68  LATICACIDVEN 0.9    Estimated Creatinine Clearance: 109.6 mL/min (by C-G formula based on SCr of 0.68 mg/dL).    Allergies  Allergen Reactions   Fentanyl Shortness Of Breath   Codeine    Morphine And Codeine     Antimicrobials this admission: 1/12 vanc >>  Dose adjustments this admission:   Microbiology results: 1/12 BCx:   Thank you for allowing pharmacy to be a part of this patient's care.  Leeroy Mace RPh 12/06/2023, 5:57 AM

## 2023-12-06 NOTE — Progress Notes (Signed)
 IV Team to bedside per consult.  Pt refused the use of ultrasound. Pt instructed IVT RN to stick blind on R posterior FA. Attempted x2, unsuccessful. RN Cristy notified.

## 2023-12-06 NOTE — Hospital Course (Addendum)
 58 y.o. female with medical history significant for essential hypertension on Exforge , Coreg , Lasix , HCTZ, peripheral polyneuropathy, lupus presenting with 10 days of progressive LUL EEL edema after left knee, swelling warmth.  She was diagnosed with left lower extremity cellulitis completed 5 days of oral doxycycline  from 11/30/2023 but without improvement.  However onset of symptoms was 3 weeks ago with worsening edema in the bilateral extremities had venous ultrasound on 12/13 negative for DVT. Seen at the med [facility], in the ED vitals fairly stable not hypoxic Labs with hypokalemia normal lactate WBC count.  Blood culture sent, imaging with chest x-ray-mild interstitial prominence -smoking-related versus edema.  Left leg x-ray with infiltrative subcutaneous edema throughout calf, total knee prosthesis.  Patient was given IV Lasix , pain medication Toradol  vancomycin  and admitted.  Patient is manage IV antibiotics MRSA was negative switch to Ancef .  Patient was  placed on IV Lasix .  Patient completed antibiotics, at this time waiting for placement.  Subsequently patient decided to go home to daughter's house, Dme equipments including hospital bed has been delivered 1/23

## 2023-12-06 NOTE — Consult Note (Addendum)
 WOC Nurse Consult Note: Reason for Consult: Consult requested for bilat buttocks. Pt has 2 areas of partial thickness skin loss where she states previous blisters were located in these sites and  have now ruptured.  Left lower buttock .2X1.5cm X.1cm, pink and dry linear area Right lower buttock 2X2X.1cm, pink and dry Pt states she spends a large amt time in a recliner prior to admission and also has to slide on and off to go to the bathroom.  Appearance and location of the wounds is consistent with chronic tissue damage and shear, NOT pressure injuries.  Dressing procedure/placement/frequency: Topical treatment orders provided for bedside nurses to perform as follows to protect from further injury: Foam dressing to bilat lower buttocks, change Q 3 days or PRN soiling. Please re-consult if further assistance is needed.  Thank-you,  Stephane Fought MSN, RN, CWOCN, [name], CNS [phone]

## 2023-12-06 NOTE — Progress Notes (Signed)
 Pharmacy Antibiotic Note  Sierra Atkins is a 58 y.o. female admitted on 12/05/2023 with a PMHx of lupus, HTN, arthritis, HTN, CAD s/p stent, benign brain tumor s/p craniotomy, who presents to the ED with a 3 week history of bilateral leg swelling and erythema.  Pharmacy has been consulted for vancomycin  dosing.  Loading dose given in the ED  Plan: Vancomycin  1000 mg IV q12h (SCr 0.8, Vd 0.5, est AUC 463) Measure Vanc levels as needed.  Goal AUC = 400 - 550. Follow up renal function, culture results, and clinical course.   Height: 5' 7 (170.2 cm) Weight: 126.1 kg (278 lb) IBW/kg (Calculated) : 61.6  Temp (24hrs), Avg:98.5 F (36.9 C), Min:98.4 F (36.9 C), Max:98.7 F (37.1 C)  Recent Labs  Lab 12/05/23 1911 12/06/23 0551  WBC 9.3 8.1  CREATININE 0.68 0.51  LATICACIDVEN 0.9  --     Estimated Creatinine Clearance: 109.6 mL/min (by C-G formula based on SCr of 0.51 mg/dL).    Allergies  Allergen Reactions   Fentanyl Shortness Of Breath   Codeine    Morphine And Codeine     Antimicrobials this admission: 1/12 vanc >> 1/13 Ceftriaxone  >>   Dose adjustments this admission:   Microbiology results: 1/12 BCx: ngtd  Thank you for allowing pharmacy to be a part of this patient's care.  Wanda Hasting PharmD, BCPS [facility] main pharmacy [phone] 12/06/2023 11:27 AM

## 2023-12-06 NOTE — ED Notes (Signed)
 Attempted to call report. Asked to call back in 5-10 mins.

## 2023-12-06 NOTE — ED Notes (Signed)
 Pt called out and advised her RUA, around the USIV site, is itching and swollen now. The pt had finished her Vanc infusion several minutes prior, and she asked if she needed more benadryl . The pt had tenderness upon palpation and her skin was now taut and tender. USIV was removed.   No more than 75cc of the 1g/200cc of Vanc could have infiltrated. The IV was verified before the slower rate dosage was administered. Pt advised the pain was relatively recent.   Contacted [facility] ER Pharmacist for guidance, recommended hot/cold compress for comfort, patient preference. Elevation if swollen, and DC of IV (already performed). Pharm to add a note about Vanc guidelines for future. EDP notified.

## 2023-12-06 NOTE — Plan of Care (Signed)
 Problem: Education: Goal: Knowledge of General Education information will improve Description: Including pain rating scale, medication(s)/side effects and non-pharmacologic comfort measures 12/06/2023 0623 by Rockford Leander BIRCH, RN Outcome: Progressing 12/06/2023 0623 by [place]ford Leander BIRCH, RN Outcome: Progressing 12/06/2023 0622 by [place]ford Leander BIRCH, RN Outcome: Progressing   Problem: Health Behavior/Discharge Planning: Goal: Ability to manage health-related needs will improve 12/06/2023 0623 by Rockford Leander BIRCH, RN Outcome: Progressing 12/06/2023 0623 by Rockford Leander BIRCH, RN Outcome: Progressing 12/06/2023 0622 by Rockford Leander BIRCH, RN Outcome: Progressing   Problem: Clinical Measurements: Goal: Ability to maintain clinical measurements within normal limits will improve 12/06/2023 0623 by Rockford Leander BIRCH, RN Outcome: Progressing 12/06/2023 0623 by Rockford Leander BIRCH, RN Outcome: Progressing 12/06/2023 0622 by Rockford Leander D, RN Outcome: Progressing Goal: Will remain free from infection 12/06/2023 0623 by Rockford Leander BIRCH, RN Outcome: Progressing 12/06/2023 0623 by Rockford Leander BIRCH, RN Outcome: Progressing 12/06/2023 0622 by [place]ford Leander D, RN Outcome: Progressing Goal: Diagnostic test results will improve 12/06/2023 0623 by Rockford Leander BIRCH, RN Outcome: Progressing 12/06/2023 0623 by Rockford Leander BIRCH, RN Outcome: Progressing 12/06/2023 0622 by [place]ford Leander D, RN Outcome: Progressing Goal: Respiratory complications will improve 12/06/2023 0623 by Rockford Leander BIRCH, RN Outcome: Progressing 12/06/2023 0623 by Rockford Leander BIRCH, RN Outcome: Progressing 12/06/2023 0622 by Rockford Leander D, RN Outcome: Progressing Goal: Cardiovascular complication will be avoided 12/06/2023 0623 by Rockford Leander BIRCH, RN Outcome: Progressing 12/06/2023 0623 by Rockford Leander BIRCH, RN Outcome: Progressing 12/06/2023 0622 by Rockford Leander BIRCH, RN Outcome: Progressing   Problem: Activity: Goal: Risk for  activity intolerance will decrease 12/06/2023 0623 by Rockford Leander BIRCH, RN Outcome: Progressing 12/06/2023 0623 by Rockford Leander BIRCH, RN Outcome: Progressing 12/06/2023 0622 by Rockford Leander BIRCH, RN Outcome: Progressing   Problem: Nutrition: Goal: Adequate nutrition will be maintained 12/06/2023 0623 by Rockford Leander BIRCH, RN Outcome: Progressing 12/06/2023 0623 by Rockford Leander BIRCH, RN Outcome: Progressing 12/06/2023 0622 by Rockford Leander D, RN Outcome: Progressing   Problem: Coping: Goal: Level of anxiety will decrease 12/06/2023 0623 by Rockford Leander BIRCH, RN Outcome: Progressing 12/06/2023 0623 by Rockford Leander BIRCH, RN Outcome: Progressing 12/06/2023 0622 by Rockford Leander D, RN Outcome: Progressing   Problem: Elimination: Goal: Will not experience complications related to bowel motility 12/06/2023 0623 by Rockford Leander BIRCH, RN Outcome: Progressing 12/06/2023 0623 by Rockford Leander BIRCH, RN Outcome: Progressing 12/06/2023 0622 by Rockford Leander BIRCH, RN Outcome: Progressing Goal: Will not experience complications related to urinary retention 12/06/2023 0623 by Rockford Leander BIRCH, RN Outcome: Progressing 12/06/2023 0623 by Rockford Leander BIRCH, RN Outcome: Progressing 12/06/2023 0622 by Rockford Leander BIRCH, RN Outcome: Progressing   Problem: Pain Management: Goal: General experience of comfort will improve 12/06/2023 0623 by Rockford Leander BIRCH, RN Outcome: Progressing 12/06/2023 0623 by Rockford Leander BIRCH, RN Outcome: Progressing 12/06/2023 0622 by Rockford Leander BIRCH, RN Outcome: Progressing   Problem: Safety: Goal: Ability to remain free from injury will improve 12/06/2023 0623 by Rockford Leander BIRCH, RN Outcome: Progressing 12/06/2023 0623 by Rockford Leander BIRCH, RN Outcome: Progressing 12/06/2023 0622 by Rockford Leander D, RN Outcome: Progressing   Problem: Skin Integrity: Goal: Risk for impaired skin integrity will decrease 12/06/2023 0623 by Rockford Leander BIRCH, RN Outcome: Progressing 12/06/2023 0623 by  Rockford Leander BIRCH, RN Outcome: Progressing 12/06/2023 0622 by Rockford Leander BIRCH, RN Outcome: Progressing   Problem: Clinical Measurements: Goal: Ability to avoid or minimize complications of infection will improve 12/06/2023 0623 by Rockford Leander BIRCH, RN Outcome: Progressing 12/06/2023 0623 by  Rockford Leander BIRCH, RN Outcome: Progressing 12/06/2023 0622 by Rockford Leander BIRCH, RN Outcome: Progressing   Problem: Skin Integrity: Goal: Skin integrity will improve 12/06/2023 0623 by Rockford Leander BIRCH, RN Outcome: Progressing 12/06/2023 0623 by Rockford Leander BIRCH, RN Outcome: Progressing 12/06/2023 0622 by Rockford Leander BIRCH, RN Outcome: Progressing

## 2023-12-06 NOTE — Evaluation (Signed)
 OT Cancellation Note  Patient Details Name: Sierra Atkins MRN: [id number] DOB: 29-Oct-1966   Cancelled Treatment:    Reason Eval/Treat Not Completed: Fatigue/lethargy limiting ability to participate. Pt up in recliner, pleasant,reporting recent meds are making her drowsy. Pt barely able to keep eyes open to have a conversation - OT will defer eval at this time until pt able to participate.  Rachel Samples L. Dotsie Gillette, OTR/L  12/06/23, 12:34 PM

## 2023-12-06 NOTE — Evaluation (Signed)
 Physical Therapy Evaluation Patient Details Name: Sierra Atkins MRN: [id number] DOB: 07/26/1966 Today's Date: 12/06/2023  History of Present Illness  58 y.o. female  brought to [facility] with LE cellulitis, edema ,recently treated with no improvement. PMH: essential hypertension ,peripheral polyneuropathy, Bil. Hip DJD, LTKA craniotomy for benign tumor. Legs  Negative for DVT.  Clinical Impression  Pt admitted with above diagnosis.  Pt currently with functional limitations due to the deficits listed below (see PT Problem List). Pt will benefit from acute skilled PT to increase their independence and safety with mobility to allow discharge.      The Patient reports feeling better has had pain medication.  Patient tolerated  mobility, getting up  to recliner, using Rw. Patient should progress to Dc home, staying with daughter while waiting for THA surgery once she reaches ideal weight.  Patient will benefit from a bariatric RW  at DC.       If plan is discharge home, recommend the following: A little help with walking and/or transfers;A little help with bathing/dressing/bathroom;Assist for transportation;Assistance with cooking/housework   Can travel by [employer] (2 wheels) (bariatric)  Recommendations for Other Services       Functional Status Assessment Patient has had a recent decline in their functional status and demonstrates the ability to make significant improvements in function in a reasonable and predictable amount of time.     Precautions / Restrictions Precautions Precautions: Fall      Mobility  Bed Mobility Overal bed mobility: Needs Assistance Bed Mobility: Supine to Sit     Supine to sit: Min assist, HOB elevated     General bed mobility comments: assistance  to raise trunk    Transfers Overall transfer level: Needs assistance Equipment used: Rolling walker (2 wheels) Transfers: Sit to/from Stand, Bed to  chair/wheelchair/BSC Sit to Stand: Min assist   Step pivot transfers: Min assist       General transfer comment: Steady supported on RW.    Ambulation/Gait Ambulation/Gait assistance: Min assist Gait Distance (Feet): 5 Feet Assistive device: Rolling walker (2 wheels) Gait Pattern/deviations: Step-to pattern       General Gait Details: step in place at Recliner x 10  Stairs            Wheelchair Mobility     Tilt Bed    Modified Rankin (Stroke Patients Only)       Balance Overall balance assessment: Mild deficits observed, not formally tested                                           Pertinent Vitals/Pain Pain Assessment Pain Assessment: Faces Faces Pain Scale: Hurts little more Pain Location: legs  and feet Pain Descriptors / Indicators: Numbness, Sore, Tender, Discomfort Pain Intervention(s): Monitored during session, Premedicated before session, Repositioned    Home Living Family/patient expects to be discharged to:: Private residence Living Arrangements: Children Available Help at Discharge: Family;Available PRN/intermittently Type of Home: House Home Access: Level entry       Home Layout: One level Home Equipment: Cane - single point Additional Comments: staying with daughter, losing weight in prep for THA's    Prior Function Prior Level of Function : Independent/Modified Independent             Mobility Comments: recently required assistance for ambulation  with cane,  ADLs Comments: sponge bath  recently due to LE edema     Extremity/Trunk Assessment   Upper Extremity Assessment Upper Extremity Assessment: Overall WFL for tasks assessed    Lower Extremity Assessment Lower Extremity Assessment:  (moves legs well, decrese hip abduction, able to stnad and WB)    Cervical / Trunk Assessment Cervical / Trunk Assessment:  (reports  tenderness post head from craniottomy.)  Communication    Communication Communication: No apparent difficulties  Cognition Arousal: Alert Behavior During Therapy: WFL for tasks assessed/performed Overall Cognitive Status: Within Functional Limits for tasks assessed                                          General Comments      Exercises     Assessment/Plan    PT Assessment Patient needs continued PT services  PT Problem List Decreased strength;Decreased range of motion;Decreased activity tolerance;Obesity;Pain       PT Treatment Interventions DME instruction;Therapeutic activities;Gait training;Therapeutic exercise;Patient/family education;Functional mobility training    PT Goals (Current goals can be found in the Care Plan section)  Acute Rehab PT Goals Patient Stated Goal: to get hip replacements PT Goal Formulation: With patient Time For Goal Achievement: 12/20/23 Potential to Achieve Goals: Good    Frequency Min 1X/week     Co-evaluation               AM-PAC PT 6 Clicks Mobility  Outcome Measure Help needed turning from your back to your side while in a flat bed without using bedrails?: A Little Help needed moving from lying on your back to sitting on the side of a flat bed without using bedrails?: A Little Help needed moving to and from a bed to a chair (including a wheelchair)?: A Little Help needed standing up from a chair using your arms (e.g., wheelchair or bedside chair)?: A Little Help needed to walk in hospital room?: A Lot Help needed climbing 3-5 steps with a railing? : Total 6 Click Score: 15    End of Session Equipment Utilized During Treatment: Gait belt Activity Tolerance: Patient tolerated treatment well Patient left: in chair;with call bell/phone within reach Nurse Communication: Mobility status PT Visit Diagnosis: Unsteadiness on feet (R26.81);Pain Pain - Right/Left: Right Pain - part of body: Hip    Time: 1131-1155 PT Time Calculation (min) (ACUTE ONLY): 24  min   Charges:   PT Evaluation $PT Eval Low Complexity: 1 Low PT Treatments $Therapeutic Activity: 8-22 mins PT General Charges $$ ACUTE PT VISIT: 1 Visit         [name]   [name] 12/06/2023, 12:12 PM

## 2023-12-06 NOTE — TOC Initial Note (Signed)
 Transition of Care [facility]) - Initial/Assessment Note    Patient Details  Name: Sierra Atkins MRN: [id number] Date of Birth: 1966/10/28  [facility]) CM/SW Contact:    Alfonse JONELLE Rex, RN Phone Number: 12/06/2023, 11:52 AM  Clinical Narrative:    Met with pt at bedside to introduce role of TOC/NCM and review for dc planning, reports she has an established PCP and pharmacy, pt reports she resides in the [place], [place] area, currently living with her dtr in [place], reports no current home care services, reports she uses a cane. PT eval pending, await recommendation. TOC will continue to follow.                Expected Discharge Plan: Home/Self Care Barriers to Discharge: Continued Medical Work up   Patient Goals and CMS Choice Patient states their goals for this hospitalization and ongoing recovery are:: return home          Expected Discharge Plan and Services       Living arrangements for the past 2 months: Single Family Home                                      Prior Living Arrangements/Services Living arrangements for the past 2 months: Single Family Home Lives with:: Self Patient language and need for interpreter reviewed:: Yes Do you feel safe going back to the place where you live?: Yes      Need for Family Participation in Patient Care: Yes (Comment) Care giver support system in place?: Yes (comment) Current home services: DME (walker) Criminal Activity/Legal Involvement Pertinent to Current Situation/Hospitalization: No - Comment as needed  Activities of Daily Living   ADL Screening (condition at time of admission) Independently performs ADLs?: No Does the patient have a NEW difficulty with bathing/dressing/toileting/self-feeding that is expected to last >3 days?: Yes (Initiates electronic notice to provider for possible OT consult) Does the patient have a NEW difficulty with getting in/out of bed, walking, or climbing stairs that is  expected to last >3 days?: Yes (Initiates electronic notice to provider for possible PT consult) Does the patient have a NEW difficulty with communication that is expected to last >3 days?: No Is the patient deaf or have difficulty hearing?: No Does the patient have difficulty seeing, even when wearing glasses/contacts?: No Does the patient have difficulty concentrating, remembering, or making decisions?: No  Permission Sought/Granted                  Emotional Assessment Appearance:: Appears stated age Attitude/Demeanor/Rapport: Gracious   Orientation: : Oriented to Self, Oriented to Place, Oriented to  Time, Oriented to Situation Alcohol / Substance Use: Not Applicable Psych Involvement: No (comment)  Admission diagnosis:  Cellulitis of left lower extremity [L03.116] Cellulitis of lower extremity [L03.119] Patient Active Problem List   Diagnosis Date Noted   Hypokalemia 12/06/2023   Peripheral polyneuropathy 12/06/2023   Essential hypertension 12/06/2023   Lupus (systemic lupus erythematosus) (HCC) 12/06/2023   Cellulitis of left lower extremity 12/05/2023   PCP:  Default, Provider, MD Pharmacy:   [id number] [place], [place] - [address] AT [facility] [address] [place] [address] Phone: [phone] Fax: [phone]  [id number] - [place], [place] - [address] AT [facility] [address] [place] [address] Phone: [phone] Fax:  [phone]  [id number] [place], [place] - [address] [place] [address] Phone: [phone] Fax: [phone]     Social Drivers of Health (SDOH) Social History: SDOH Screenings   Food Insecurity: No Food Insecurity (12/06/2023)  Housing: Unknown (12/06/2023)  Transportation Needs: No Transportation Needs (12/06/2023)  Utilities: Not At Risk (12/06/2023)  Social  Connections: Unknown (12/06/2023)  Stress: No Stress Concern Present (08/13/2020)   Received from [facility]  Tobacco Use: High Risk (12/06/2023)   SDOH Interventions:     Readmission Risk Interventions    12/06/2023   11:51 AM  Readmission Risk Prevention Plan  Transportation Screening Complete  PCP or Specialist Appt within 5-7 Days Complete  Home Care Screening Complete  Medication Review (RN CM) Complete

## 2023-12-07 DIAGNOSIS — L03116 Cellulitis of left lower limb: Secondary | ICD-10-CM | POA: Diagnosis not present

## 2023-12-07 LAB — BASIC METABOLIC PANEL
Anion gap: 5 (ref 5–15)
BUN: 12 mg/dL (ref 6–20)
CO2: 26 mmol/L (ref 22–32)
Calcium: 8.5 mg/dL — ABNORMAL LOW (ref 8.9–10.3)
Chloride: 106 mmol/L (ref 98–111)
Creatinine, Ser: 0.73 mg/dL (ref 0.44–1.00)
GFR, Estimated: >60 mL/min (ref >60)
Glucose, Bld: 90 mg/dL (ref 70–99)
Potassium: 3.6 mmol/L (ref 3.5–5.1)
Sodium: 137 mmol/L (ref 135–145)

## 2023-12-07 LAB — CBC
HCT: 34.8 % — ABNORMAL LOW (ref 36.0–46.0)
Hemoglobin: 11.1 g/dL — ABNORMAL LOW (ref 12.0–15.0)
MCH: 32.1 pg (ref 26.0–34.0)
MCHC: 31.9 g/dL (ref 30.0–36.0)
MCV: 100.6 fL — ABNORMAL HIGH (ref 80.0–100.0)
Platelets: 300 10*3/uL (ref 150–400)
RBC: 3.46 MIL/uL — ABNORMAL LOW (ref 3.87–5.11)
RDW: 13.9 % (ref 11.5–15.5)
WBC: 7.5 10*3/uL (ref 4.0–10.5)
nRBC: 0 % (ref 0.0–0.2)

## 2023-12-07 MED ORDER — SALINE SPRAY 0.65 % NA SOLN
1.0000 | NASAL | Status: DC | PRN
  Administered 2023-12-07: 1 via NASAL
  Filled 2023-12-07: qty 44

## 2023-12-07 MED ORDER — ZOLPIDEM TARTRATE 5 MG PO TABS
5.0000 mg | ORAL_TABLET | Freq: Once | ORAL | Status: AC
  Administered 2023-12-07: 5 mg via ORAL
  Filled 2023-12-07: qty 1

## 2023-12-07 MED ORDER — DICLOFENAC SODIUM 1 % EX GEL
2.0000 g | Freq: Four times a day (QID) | CUTANEOUS | Status: DC | PRN
  Administered 2023-12-07 – 2023-12-15 (×3): 2 g via TOPICAL
  Filled 2023-12-07 (×2): qty 100

## 2023-12-07 NOTE — Evaluation (Signed)
 Occupational Therapy Evaluation Patient Details Name: Sierra Atkins MRN: [id number] DOB: 1966/03/19 Today's Date: 12/07/2023   History of Present Illness 58 y.o. female  brought to [facility] with LE cellulitis, edema ,recently treated with no improvement. PMH: essential hypertension ,peripheral polyneuropathy, Bil. Hip DJD, LTKA craniotomy for benign tumor. Legs  Negative for DVT.   Clinical Impression   Pt presents with decline in function and safety with ADLs and ADL mobility with impaired balance and endurance. Pt currently waiting THA once weight loss requirement is met. PTA pt lived at home alone and was Ind with ADLs/selfcare, home mgt and used cane for mobility. Pt reports that she will d/c to her daughter's home. Pt currently requires sup with UB ADLs, max A with LB ADLs, mod A with toileting and min A with mobility/transfers using RW. Pt would benefit from acute OT services to address impairments to maximize level of function and safety      If plan is discharge home, recommend the following: A lot of help with bathing/dressing/bathroom;A little help with walking and/or transfers;Assistance with cooking/housework;Assist for transportation;Help with stairs or ramp for entrance    Functional Status Assessment  Patient has had a recent decline in their functional status and demonstrates the ability to make significant improvements in function in a reasonable and predictable amount of time.  Equipment Recommendations  Tub/shower bench    Recommendations for Other Services       Precautions / Restrictions Precautions Precautions: Fall Restrictions Weight Bearing Restrictions Per Provider Order: No      Mobility Bed Mobility Overal bed mobility: Needs Assistance Bed Mobility: Supine to Sit     Supine to sit: Contact guard, Used rails     General bed mobility comments: increased time and effort, no physical assist    Transfers Overall transfer level: Needs  assistance Equipment used: Rolling walker (2 wheels) Transfers: Sit to/from Stand, Bed to chair/wheelchair/BSC Sit to Stand: Min assist     Step pivot transfers: Min assist            Balance Overall balance assessment: Mild deficits observed, not formally tested                                         ADL either performed or assessed with clinical judgement   ADL Overall ADL's : Needs assistance/impaired Eating/Feeding: Independent   Grooming: Wash/dry hands;Wash/dry face;Supervision/safety;Sitting   Upper Body Bathing: Supervision/ safety;Sitting   Lower Body Bathing: Maximal assistance   Upper Body Dressing : Supervision/safety;Sitting   Lower Body Dressing: Maximal assistance   Toilet Transfer: Minimal assistance;Ambulation;Rolling walker (2 wheels)   Toileting- Clothing Manipulation and Hygiene: Moderate assistance;Sit to/from stand       Functional mobility during ADLs: Minimal assistance       Vision Baseline Vision/History: 1 Wears glasses Ability to See in Adequate Light: 0 Adequate Patient Visual Report: No change from baseline       Perception         Praxis         Pertinent Vitals/Pain Pain Assessment Pain Assessment: 0-10 Pain Score: 3  Pain Location: legs  and feet Pain Descriptors / Indicators: Discomfort, Aching Pain Intervention(s): Monitored during session, Premedicated before session, Repositioned     Extremity/Trunk Assessment Upper Extremity Assessment Upper Extremity Assessment: Overall WFL for tasks assessed   Lower Extremity Assessment Lower Extremity Assessment: Defer to PT evaluation  Communication Communication Communication: No apparent difficulties   Cognition Arousal: Alert Behavior During Therapy: WFL for tasks assessed/performed Overall Cognitive Status: Within Functional Limits for tasks assessed                                 General Comments: pleasant, jovial and  hyperverbose     General Comments       Exercises     Shoulder Instructions      Home Living Family/patient expects to be discharged to:: Private residence Living Arrangements: Children Available Help at Discharge: Family;Available PRN/intermittently Type of Home: House Home Access: Level entry     Home Layout: One level     Bathroom Shower/Tub: Chief Strategy Officer: Standard     Home Equipment: Cane - single point   Additional Comments: staying with daughter, losing weight in prep for THA's      Prior Functioning/Environment Prior Level of Function : Independent/Modified Independent             Mobility Comments: recently required assistance for ambulation with cane, ADLs Comments: Ind with ADLs/selfcare, sponge bath  recently due to LE edema        OT Problem List: Decreased activity tolerance;Decreased knowledge of use of DME or AE;Pain;Impaired balance (sitting and/or standing)      OT Treatment/Interventions: Self-care/ADL training;DME and/or AE instruction;Therapeutic activities;Patient/family education    OT Goals(Current goals can be found in the care plan section) Acute Rehab OT Goals Patient Stated Goal: go home OT Goal Formulation: With patient Time For Goal Achievement: 12/21/23 Potential to Achieve Goals: Good ADL Goals Pt Will Perform Grooming: with set-up;sitting Pt Will Perform Upper Body Bathing: with set-up;sitting Pt Will Perform Lower Body Bathing: with mod assist;with min assist Pt Will Perform Upper Body Dressing: with set-up Pt Will Perform Lower Body Dressing: with mod assist;with min assist Pt Will Transfer to Toilet: with contact guard assist;with supervision;ambulating Pt Will Perform Toileting - Clothing Manipulation and hygiene: with min assist;sit to/from stand  OT Frequency: Min 1X/week    Co-evaluation              AM-PAC OT 6 Clicks Daily Activity     Outcome Measure Help from another person  eating meals?: None Help from another person taking care of personal grooming?: A Little Help from another person toileting, which includes using toliet, bedpan, or urinal?: A Little Help from another person bathing (including washing, rinsing, drying)?: A Lot Help from another person to put on and taking off regular upper body clothing?: A Little Help from another person to put on and taking off regular lower body clothing?: A Lot 6 Click Score: 17   End of Session Equipment Utilized During Treatment: Gait belt;Rolling walker (2 wheels)  Activity Tolerance: Patient tolerated treatment well Patient left: in chair;with call bell/phone within reach  OT Visit Diagnosis: Other abnormalities of gait and mobility (R26.89);Muscle weakness (generalized) (M62.81);Pain Pain - part of body: Leg;Knee (LEs)                Time: 9046-8981 OT Time Calculation (min): 25 min Charges:  OT General Charges $OT Visit: 1 Visit OT Evaluation $OT Eval Moderate Complexity: 1 Mod OT Treatments $Therapeutic Activity: 8-22 mins    Jacques Karna Loose 12/07/2023, 12:54 PM

## 2023-12-07 NOTE — Progress Notes (Signed)
 Mobility Specialist - Progress Note   12/07/23 1545  Mobility  Activity Transferred from chair to bed  Level of Assistance Standby assist, set-up cues, supervision of patient - no hands on  Assistive Device Front wheel walker  Activity Response Tolerated well  Mobility Referral Yes  Mobility visit 1 Mobility  Mobility Specialist Start Time (ACUTE ONLY) 1526  Mobility Specialist Stop Time (ACUTE ONLY) 1545  Mobility Specialist Time Calculation (min) (ACUTE ONLY) 19 min   Pt received in recliner requesting assistance back to bed. Pt was minA from STS & SB during transfer. No complaints during session. Pt to bed after session with all needs met. NT in room.    [facility]

## 2023-12-07 NOTE — TOC Progression Note (Addendum)
 Transition of Care [facility]) - Progression Note    Patient Details  Name: Laniesha Das MRN: [id number] Date of Birth: August 18, 1966  Transition of Care [facility]) CM/SW Contact  Alfonse JONELLE Rex, RN Phone Number: 12/07/2023, 11:54 AM  Clinical Narrative:  PT eval completed, recommendation for [facility] PT, pt agreeable, no preference. Local Address: [address] [place], [place], [place] [address]. Faxed out for [facility] PT. TOC will continue to follow.    -1:12pm [facility], rep-Amy, accepted for [facility] PT, added to AVS.       Expected Discharge Plan: Home/Self Care Barriers to Discharge: Continued Medical Work up  Expected Discharge Plan and Services       Living arrangements for the past 2 months: Single Family Home                                       Social Determinants of Health (SDOH) Interventions SDOH Screenings   Food Insecurity: No Food Insecurity (12/06/2023)  Housing: Unknown (12/06/2023)  Transportation Needs: No Transportation Needs (12/06/2023)  Utilities: Not At Risk (12/06/2023)  Social Connections: Unknown (12/06/2023)  Stress: No Stress Concern Present (08/13/2020)   Received from [facility], [facility]  Tobacco Use: High Risk (12/06/2023)    Readmission Risk Interventions    12/06/2023   11:51 AM  Readmission Risk Prevention Plan  Transportation Screening Complete  PCP or Specialist Appt within 5-7 Days Complete  Home Care Screening Complete  Medication Review (RN CM) Complete

## 2023-12-07 NOTE — Plan of Care (Signed)
   Problem: Education: Goal: Knowledge of General Education information will improve Description Including pain rating scale, medication(s)/side effects and non-pharmacologic comfort measures Outcome: Progressing   Problem: Health Behavior/Discharge Planning: Goal: Ability to manage health-related needs will improve Outcome: Progressing

## 2023-12-07 NOTE — Progress Notes (Signed)
 PROGRESS NOTE Aliviya Schoeller  FMW:[id number] DOB: 1966-09-01 DOA: 12/05/2023 PCP: Default, Provider, MD  Brief Narrative/Hospital Course: 58 y.o. female with medical history significant for essential hypertension on Exforge , Coreg , Lasix , HCTZ, peripheral polyneuropathy, lupus presenting with 10 days of progressive LUL EEL edema after left knee, swelling warmth.  She was diagnosed with left lower extremity cellulitis completed 5 days of oral doxycycline  from 11/30/2023 but without improvement.  However onset of symptoms was 3 weeks ago with worsening edema in the bilateral extremities had venous ultrasound on 12/13 negative for DVT. Seen at the med [facility], in the ED vitals fairly stable not hypoxic Labs with hypokalemia normal lactate WBC count.  Blood culture sent, imaging with chest x-ray-mild interstitial prominence -smoking-related versus edema.  Left leg x-ray with infiltrative subcutaneous edema throughout calf, total knee prosthesis.  Patient was given IV Lasix , pain medication Toradol  vancomycin  and admitted.    Subjective: States leg feels better had to take pain medication earlier Legs are less swollen and less tender Overnight afebrile BP stable Labs unremarkable Urine output 1200 cc Wt at 337 lb on admission patient endorsed her weight was 278 pound at baseline   Assessment and Plan: Principal Problem:   Cellulitis of left lower extremity Active Problems:   Hypokalemia   Peripheral polyneuropathy   Essential hypertension   Lupus (systemic lupus erythematosus) (HCC)  Moderate severe cellulitis LLE Lower leg edema Failed outpatient oral doxycycline .Duplex negative for DVT 11/22/23. BNP normal-could be false due to obesity Echo 1/13 showed EF 60 to 65%, diastolic parameters indeterminate right ventricular size is not well-visualized, poor acoustic windows.Labs with normal WBC count.  Patient reports she had blisters on left lower leg which is resolved. blood culture   NGTD. Continue treatment for cellulitis-with IV antibiotics, elevate leg, pain control, iv lasix , add compression bandages once pain stable Cont to monitor daily I/O,weight, electrolytes and net balance as below.Keep on  salt/fluid restricted diet and monitor in tele. Net IO Since Admission: 297.57 mL [12/07/23 1114]  Filed Weights   12/05/23 1714 12/07/23 0500  Weight: 126.1 kg (!) 153.2 kg    Recent Labs  Lab 12/05/23 1911 12/06/23 0551 12/07/23 0446  BNP  --  27.1  --   BUN 12 12 12   CREATININE 0.68 0.51 0.73  K 3.2* 3.0* 3.6  MG  --  2.0  --     SLE: cont home PLAQUINEL  Hypokalemia: Replaced  HTN: BP is well-controlled.  Continue diuretics and resume multiple home meds-pending med rec  Peripheral neuropathy: Continue gabapentin   Morbid obesity: Patient's Body mass index is 52.9 kg/m. : Will benefit with PCP follow-up, weight loss  healthy lifestyle and outpatient sleep evaluation.   DVT prophylaxis: SCDs Start: 12/06/23 0517 Code Status:   Code Status: Full Code Family Communication: plan of care discussed with patient at bedside. Patient status is: Remains hospitalized because of severity of illness Level of care: Med-Surg   Dispo: The patient is from: home with daughter            Anticipated disposition: TBD Objective: Vitals last 24 hrs: Vitals:   12/06/23 2039 12/07/23 0203 12/07/23 0500 12/07/23 0653  BP: (!) 145/73 127/66  (!) 119/57  Pulse: 82 81  81  Resp: 18 18  18   Temp: 99.6 F (37.6 C) (!) 96.6 F (35.9 C)  98.5 F (36.9 C)  TempSrc: Oral   Oral  SpO2: 96% 100%  95%  Weight:   (!) 153.2 kg   Height:  Weight change: 27.1 kg  Physical Examination: General exam: alert awake,at baseline, older than stated age HEENT:Oral mucosa moist, Ear/Nose WNL grossly Respiratory system: Bilaterally clear BS,no use of accessory muscle Cardiovascular system: S1 & S2 +, No JVD. Gastrointestinal system: Abdomen soft,NT,ND, BS+ Nervous System:  Alert, awake, moving all extremities,and following commands. Extremities: LE edema ++ w/ with some erythema and tenderness in legs  Skin: No rashes,no icterus. MSK: Normal muscle bulk,tone, power  Medications reviewed:  Scheduled Meds:  furosemide   40 mg Intravenous Daily   gabapentin   300 mg Oral TID   hydroxychloroquine   200 mg Oral BID   Continuous Infusions:  cefTRIAXone  (ROCEPHIN )  IV 1 g (12/07/23 0553)   vancomycin  1,000 mg (12/07/23 0008)      Diet Order             Diet regular Room service appropriate? Yes; Fluid consistency: Thin  Diet effective now                  Intake/Output Summary (Last 24 hours) at 12/07/2023 0920 Last data filed at 12/07/2023 0908 Gross per 24 hour  Intake 2400 ml  Output 1200 ml  Net 1200 ml   Net IO Since Admission: 797.57 mL [12/07/23 0920]  Wt Readings from Last 3 Encounters:  12/07/23 (!) 153.2 kg  11/30/23 132.5 kg  06/29/23 132.5 kg     Unresulted Labs (From admission, onward)     Start     Ordered   12/07/23 0500  Basic metabolic panel  Daily,   R      12/06/23 0733   12/07/23 0500  CBC  Daily,   R      12/06/23 0733          Data Reviewed: I have personally reviewed following labs and imaging studies CBC: Recent Labs  Lab 12/05/23 1911 12/06/23 0551 12/07/23 0446  WBC 9.3 8.1 7.5  NEUTROABS 5.1 4.1  --   HGB 11.9* 10.9* 11.1*  HCT 36.2 32.0* 34.8*  MCV 97.6 99.1 100.6*  PLT 358 291 300   Basic Metabolic Panel:  Recent Labs  Lab 12/05/23 1911 12/06/23 0551 12/07/23 0446  NA 141 138 137  K 3.2* 3.0* 3.6  CL 107 109 106  CO2 24 24 26   GLUCOSE 87 98 90  BUN 12 12 12   CREATININE 0.68 0.51 0.73  CALCIUM 9.1 8.4* 8.5*  MG  --  2.0  --    GFR: Estimated Creatinine Clearance: 123.2 mL/min (by C-G formula based on SCr of 0.73 mg/dL). Liver Function Tests:  Recent Labs  Lab 12/05/23 1911 12/06/23 0551  AST 15 13*  ALT 14 11  ALKPHOS 70 62  BILITOT 0.2 0.5  PROT 7.4 6.6  ALBUMIN 3.5 3.2*  No  results for input(s): LIPASE, AMYLASE in the last 168 hours. No results for input(s): AMMONIA in the last 168 hours. Coagulation Profile:  Recent Labs  Lab 12/05/23 1911  INR 0.9  Sepsis Labs: Recent Labs  Lab 12/05/23 1911  LATICACIDVEN 0.9   Recent Results (from the past 240 hours)  Blood culture (routine x 2)     Status: None (Preliminary result)   Collection Time: 12/05/23  7:11 PM   Specimen: BLOOD  Result Value Ref Range Status   Specimen Description   Final    BLOOD RIGHT ANTECUBITAL Performed at [facility], [address] [place], [place] [address]    Special Requests   Final    BOTTLES DRAWN  AEROBIC AND ANAEROBIC Blood Culture adequate volume Performed at [facility], [address] [place], [place] [address]    Culture   Final    NO GROWTH 2 DAYS Performed at [facility], [address] [place], [place] [address]    Report Status PENDING  Incomplete  Blood culture (routine x 2)     Status: None (Preliminary result)   Collection Time: 12/06/23  6:01 AM   Specimen: BLOOD  Result Value Ref Range Status   Specimen Description   Final    BLOOD SITE NOT SPECIFIED Performed at [facility], [address] [place], [place] [address]    Special Requests   Final    BOTTLES DRAWN AEROBIC AND ANAEROBIC Blood Culture results may not be optimal due to an inadequate volume of blood received in culture bottles Performed at [facility], [address] [place], [place] [address]    Culture   Final    NO GROWTH < 24 HOURS Performed at [facility], [address], [place], [place] [address]    Report Status PENDING  Incomplete    Antimicrobials/Microbiology: Anti-infectives (From admission, onward)    Start     Dose/Rate Route Frequency Ordered Stop   12/06/23 2000  vancomycin  (VANCOCIN ) 2,250 mg in sodium chloride  0.9 % 500 mL IVPB  Status:  Discontinued        2,250 mg 261.3 mL/hr  over 120 Minutes Intravenous Every 24 hours 12/06/23 0557 12/06/23 1123   12/06/23 1123  vancomycin  (VANCOCIN ) IVPB 1000 mg/200 mL premix        1,000 mg 200 mL/hr over 60 Minutes Intravenous Every 12 hours 12/06/23 1123     12/06/23 1000  hydroxychloroquine  (PLAQUENIL ) tablet 200 mg        200 mg Oral 2 times daily 12/06/23 0548     12/06/23 0630  cefTRIAXone  (ROCEPHIN ) 1 g in sodium chloride  0.9 % 100 mL IVPB        1 g 200 mL/hr over 30 Minutes Intravenous Every 24 hours 12/06/23 0543     12/05/23 2215  vancomycin  (VANCOCIN ) IVPB 1000 mg/200 mL premix       Placed in Followed by Linked Group   1,000 mg 200 mL/hr over 60 Minutes Intravenous  Once 12/05/23 2045 12/06/23 0012   12/05/23 2045  vancomycin  (VANCOCIN ) IVPB 1000 mg/200 mL premix       Placed in Followed by Linked Group   1,000 mg 200 mL/hr over 60 Minutes Intravenous  Once 12/05/23 2045 12/05/23 2230         Component Value Date/Time   SDES  12/06/2023 0601    BLOOD SITE NOT SPECIFIED Performed at [facility], [address] [place], [place] [address]    SPECREQUEST  12/06/2023 0601    BOTTLES DRAWN AEROBIC AND ANAEROBIC Blood Culture results may not be optimal due to an inadequate volume of blood received in culture bottles Performed at [facility], [address], [place], [place] [address]    CULT  12/06/2023 0601    NO GROWTH < 24 HOURS Performed at [facility], [address], [place], [place] [address]    REPTSTATUS PENDING 12/06/2023 9398     Radiology Studies: ECHOCARDIOGRAM COMPLETE Result Date: 12/06/2023    ECHOCARDIOGRAM REPORT   Patient Name:   MERARY GARGUILO Date of Exam: 12/06/2023 Medical Rec #:  [id number]  Height:       67.0 in Accession #:    [id number]      Weight:       278.0 lb Date of Birth:  [birth year]      BSA:          2.326 m Patient Age:    57 years        BP:           137/74 mmHg Patient Gender: F               HR:           73  bpm. Exam Location:  Inpatient Procedure: 2D Echo, Cardiac Doppler, Color Doppler and Intracardiac            Opacification Agent Indications:    CHF-Acute Diastolic I50.31  History:        Patient has no prior history of Echocardiogram examinations.                 Risk Factors:Hypertension.  Sonographer:    Jayson Gaskins Referring Phys: [id number] JUSTIN B HOWERTER IMPRESSIONS  1. Poor accoustic windows. Left ventricular ejection fraction, by estimation, is 60 to 65%. The left ventricle has normal function. The left ventricle has no regional wall motion abnormalities. Left ventricular diastolic parameters are indeterminate.  2. Right ventricular systolic function was not well visualized. The right ventricular size is not well visualized.  3. The mitral valve was not well visualized. No evidence of mitral valve regurgitation.  4. Aortic valve regurgitation is not visualized.  5. The inferior vena cava IVC is not well visualized. FINDINGS  Left Ventricle: Poor accoustic windows. Left ventricular ejection fraction, by estimation, is 60 to 65%. The left ventricle has normal function. The left ventricle has no regional wall motion abnormalities. Definity  contrast agent was given IV to delineate the left ventricular endocardial borders. The left ventricular internal cavity size was normal in size. Left ventricular diastolic parameters are indeterminate. Right Ventricle: The right ventricular size is not well visualized. Right ventricular systolic function was not well visualized. Left Atrium: Left atrial size was not well visualized. Right Atrium: Right atrial size was not well visualized. Pericardium: There is no evidence of pericardial effusion. Mitral Valve: The mitral valve was not well visualized. No evidence of mitral valve regurgitation. Tricuspid Valve: Tricuspid valve regurgitation is not demonstrated. Aortic Valve: Aortic valve regurgitation is not visualized. Aortic valve peak gradient measures 21.3 mmHg.  Pulmonic Valve: Pulmonic valve regurgitation is not visualized. Aorta: The aortic root and ascending aorta are structurally normal, with no evidence of dilitation. Venous: The inferior vena cava IVC is not well visualized. IAS/Shunts: The interatrial septum was not well visualized.  LEFT VENTRICLE PLAX 2D LVIDd:         3.60 cm LVIDs:         2.30 cm LV PW:         1.00 cm LV IVS:        1.60 cm LVOT diam:     2.10 cm LVOT Area:     3.46 cm  AORTIC VALVE AV Vmax:      231.00 cm/s AV Peak Grad: 21.3 mmHg  AORTA Ao Root diam: 3.30 cm  SHUNTS Systemic Diam: 2.10 cm Ronal Ross Electronically signed by Ronal Ross Signature Date/Time: 12/06/2023/3:20:11 PM    Final    DG Tibia/Fibula Left Result Date: 12/05/2023 CLINICAL DATA:  Cutaneous infection/cellulitis. Worsening lower extremity swelling and body aches. EXAM: LEFT TIBIA AND  FIBULA - 2 VIEW COMPARISON:  None Available. FINDINGS: Total knee prosthesis observed, no abnormal lucency around the components of the prosthesis to suggest loosening or infection. Infiltrative subcutaneous edema is present throughout the calf. No abnormal gas in the soft tissues. IMPRESSION: 1. Infiltrative subcutaneous edema throughout the calf. No abnormal gas in the soft tissues. 2. 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 Electronically Signed   By: Ryan Salvage M.D.   On: 12/05/2023 21:25   DG Chest 2 View Result Date: 12/05/2023 CLINICAL DATA:  Worsening bilateral lower leg cellulitis. Body aches. EXAM: CHEST - 2 VIEW COMPARISON:  Chest x-ray dated November 22, 2023. FINDINGS: Stable cardiomediastinal silhouette, accentuated by technique. Mild interstitial prominence. No focal consolidation, pleural effusion, or pneumothorax. No acute osseous abnormality. IMPRESSION: 1. Mild interstitial prominence may be smoking related or reflect interstitial edema. Electronically Signed   By: Elsie ONEIDA Shoulder M.D.   On: 12/05/2023 18:39     LOS: 1 day   Total time spent in  review of labs and imaging, patient evaluation, formulation of plan, documentation and communication with family: 35 minutes  Mennie LAMY, MD  [facility]  12/07/2023, 9:20 AM

## 2023-12-08 ENCOUNTER — Inpatient Hospital Stay (HOSPITAL_COMMUNITY): Payer: Medicaid Other

## 2023-12-08 DIAGNOSIS — L03116 Cellulitis of left lower limb: Secondary | ICD-10-CM | POA: Diagnosis not present

## 2023-12-08 LAB — BASIC METABOLIC PANEL
Anion gap: 8 (ref 5–15)
BUN: 13 mg/dL (ref 6–20)
CO2: 25 mmol/L (ref 22–32)
Calcium: 8.7 mg/dL — ABNORMAL LOW (ref 8.9–10.3)
Chloride: 105 mmol/L (ref 98–111)
Creatinine, Ser: 0.67 mg/dL (ref 0.44–1.00)
GFR, Estimated: >60 mL/min (ref >60)
Glucose, Bld: 93 mg/dL (ref 70–99)
Potassium: 3.6 mmol/L (ref 3.5–5.1)
Sodium: 138 mmol/L (ref 135–145)

## 2023-12-08 LAB — CBC
HCT: 33.2 % — ABNORMAL LOW (ref 36.0–46.0)
Hemoglobin: 10.8 g/dL — ABNORMAL LOW (ref 12.0–15.0)
MCH: 32.3 pg (ref 26.0–34.0)
MCHC: 32.5 g/dL (ref 30.0–36.0)
MCV: 99.4 fL (ref 80.0–100.0)
Platelets: 292 10*3/uL (ref 150–400)
RBC: 3.34 MIL/uL — ABNORMAL LOW (ref 3.87–5.11)
RDW: 13.7 % (ref 11.5–15.5)
WBC: 7.6 10*3/uL (ref 4.0–10.5)
nRBC: 0 % (ref 0.0–0.2)

## 2023-12-08 LAB — MRSA NEXT GEN BY PCR, NASAL: MRSA by PCR Next Gen: NOT DETECTED

## 2023-12-08 MED ORDER — ZOLPIDEM TARTRATE 5 MG PO TABS
5.0000 mg | ORAL_TABLET | Freq: Every evening | ORAL | Status: AC | PRN
  Administered 2023-12-08 – 2023-12-10 (×3): 5 mg via ORAL
  Filled 2023-12-08 (×3): qty 1

## 2023-12-08 MED ORDER — FUROSEMIDE 10 MG/ML IJ SOLN
40.0000 mg | Freq: Two times a day (BID) | INTRAMUSCULAR | Status: DC
  Administered 2023-12-08 – 2023-12-10 (×4): 40 mg via INTRAVENOUS
  Filled 2023-12-08 (×4): qty 4

## 2023-12-08 MED ORDER — CEFAZOLIN SODIUM-DEXTROSE 2-4 GM/100ML-% IV SOLN
2.0000 g | Freq: Three times a day (TID) | INTRAVENOUS | Status: DC
Start: 2023-12-09 — End: 2023-12-11
  Administered 2023-12-09 – 2023-12-11 (×7): 2 g via INTRAVENOUS
  Filled 2023-12-08 (×8): qty 100

## 2023-12-08 NOTE — Progress Notes (Signed)
 Physical Therapy Treatment Patient Details Name: Sierra Atkins MRN: [id number] DOB: [birth year] Today's Date: 12/08/2023   History of Present Illness 58 y.o. female  brought to [facility] with LE cellulitis, edema ,recently treated with no improvement. PMH: essential hypertension ,peripheral polyneuropathy, Bil. Hip DJD, LTKA craniotomy for benign tumor. Legs  Negative for DVT.    PT Comments  The patient very eager to ambulate. Patient  noted  to be dyspneic and reported leg pain after  40' using RW . Patient  requiring personal hygiene assist at this time.   Discussed and recommended to patient that she may benefit from Rehab at [facility]. Patient will be alone as daughter works. Patient  agreeable.  Patient will benefit from continued inpatient follow up therapy, <3 hours/day Patient    If plan is discharge home, recommend the following: A little help with walking and/or transfers;A little help with bathing/dressing/bathroom;Assist for transportation;Assistance with cooking/housework;Help with stairs or ramp for entrance   Can travel by private vehicle        Equipment Recommendations   (bariatric)    Recommendations for Other Services       Precautions / Restrictions Precautions Precautions: Fall Restrictions Weight Bearing Restrictions Per Provider Order: No     Mobility  Bed Mobility   Bed Mobility: Supine to Sit     Supine to sit: Mod assist     General bed mobility comments: assist the  legs onto bed    Transfers Overall transfer level: Needs assistance Equipment used: Rolling walker (2 wheels) Transfers: Sit to/from Stand, Bed to chair/wheelchair/BSC Sit to Stand: Min assist   Step pivot transfers: Min assist       General transfer comment: Steady supported on RW. Stepped   to [facility]    Ambulation/Gait Ambulation/Gait assistance: Min assist Gait Distance (Feet): 50 Feet Assistive device: Rolling walker (2 wheels) Gait Pattern/deviations: Step-to pattern, Wide base  of support, Trunk flexed Gait velocity: decr     General Gait Details: patient very  SOB at end, reporting severe pain in legs, struggled to get back to bed to sit down   Stairs             Wheelchair Mobility     Tilt Bed    Modified Rankin (Stroke Patients Only)       Balance Overall balance assessment: Needs assistance Sitting-balance support: Feet supported, No upper extremity supported Sitting balance-Leahy Scale: Good     Standing balance support: Bilateral upper extremity supported, Reliant on assistive device for balance, During functional activity Standing balance-Leahy Scale: Fair Standing balance comment: reliant on support                            Cognition Arousal: Alert Behavior During Therapy: WFL for tasks assessed/performed Overall Cognitive Status: Within Functional Limits for tasks assessed                                 General Comments: pleasant, jovial and hyperverbose        Exercises      General Comments        Pertinent Vitals/Pain Pain Assessment Pain Score: 10-Worst pain ever Pain Location: legs  and feet after ambul Pain Descriptors / Indicators: Discomfort, Aching Pain Intervention(s): Patient requesting pain meds-RN notified, Monitored during session    Home Living  Prior Function            PT Goals (current goals can now be found in the care plan section) Progress towards PT goals: Progressing toward goals    Frequency    Min 1X/week      PT Plan      Co-evaluation              AM-PAC PT "6 Clicks" Mobility   Outcome Measure  Help needed turning from your back to your side while in a flat bed without using bedrails?: A Little Help needed moving from lying on your back to sitting on the side of a flat bed without using bedrails?: A Little Help needed moving to and from a bed to a chair (including a wheelchair)?: A Little Help  needed standing up from a chair using your arms (e.g., wheelchair or bedside chair)?: A Little Help needed to walk in hospital room?: A Lot Help needed climbing 3-5 steps with a railing? : Total 6 Click Score: 15    End of Session   Activity Tolerance: Patient tolerated treatment well;Patient limited by fatigue;Patient limited by pain Patient left: with call bell/phone within reach;in bed;with nursing/sitter in room;with bed alarm set Nurse Communication: Mobility status PT Visit Diagnosis: Unsteadiness on feet (R26.81);Pain Pain - Right/Left: Right Pain - part of body: Knee;Hip     Time: 1455-1520 PT Time Calculation (min) (ACUTE ONLY): 25 min  Charges:    $Gait Training: 8-22 mins $Self Care/Home Management: 8-22 PT General Charges $$ ACUTE PT VISIT: 1 Visit                     Abelina Hoes PT Acute Rehabilitation Services Office [phone] Weekend [phone]    Dareen Ebbing 12/08/2023, 3:40 PM

## 2023-12-08 NOTE — Plan of Care (Signed)
 ?  Problem: Clinical Measurements: ?Goal: Ability to maintain clinical measurements within normal limits will improve ?Outcome: Progressing ?Goal: Will remain free from infection ?Outcome: Progressing ?Goal: Diagnostic test results will improve ?Outcome: Progressing ?  ?

## 2023-12-08 NOTE — Progress Notes (Signed)
 PROGRESS NOTE Judene Gahn  BJY:[id number] DOB: September 12, 1966 DOA: 12/05/2023 PCP: Default, Provider, MD  Brief Narrative/Hospital Course: 58 y.o. female with medical history significant for essential hypertension on Exforge , Coreg , Lasix , HCTZ, peripheral polyneuropathy, lupus presenting with 10 days of progressive LUL EEL edema after left knee, swelling warmth.  She was diagnosed with left lower extremity cellulitis completed 5 days of oral doxycycline  from 11/30/2023 but without improvement.  However onset of symptoms was 3 weeks ago with worsening edema in the bilateral extremities had venous ultrasound on 12/13 negative for DVT. Seen at the med [facility], in the ED vitals fairly stable not hypoxic Labs with hypokalemia normal lactate WBC count.  Blood culture sent, imaging with chest x-ray-mild interstitial prominence -smoking-related versus edema.  Left leg x-ray with infiltrative subcutaneous edema throughout calf, total knee prosthesis.  Patient was given IV Lasix , pain medication Toradol  vancomycin  and admitted.    Subjective:  Patient reports her legs feels somewhat better slowly improving still painful and swollen, less red  Overnight afebrile Labs with stable renal function Wt at 337 lb on admission patient endorsed her weight was 278 pound at baseline> asked nursing staff to check weight today   Assessment and Plan: Principal Problem:   Cellulitis of left lower extremity Active Problems:   Hypokalemia   Peripheral polyneuropathy   Essential hypertension   Lupus (systemic lupus erythematosus) (HCC)  Moderate severe cellulitis LLE Lower leg edema Failed outpatient oral doxycycline .Duplex negative for DVT 11/22/23. BNP normal-could be false due to obesity Echo 1/13 showed EF 60 to 65%, diastolic parameters indeterminate right ventricular size is not well-visualized, poor acoustic windows.Labs with normal WBC count. Patient reports she had blisters on left lower leg which is  resolved. blood culture  NGTD-continue to follow-up.  Continue current treatment for cellulitis with IV antibiotics> on vancomycin  and ceftriaxone  will de-escalate to Ancef  if MRSA NEG elevate leg, on IV Lasix  increased to 40 twice daily 1/15,will add compression bandages once pain stable Cont to monitor daily I/O,weight, electrolytes and net balance as below. Keep on  salt/fluid restricted diet and monitor in tele. Net IO Since Admission: -492.43 mL [12/08/23 1142]  Filed Weights   12/05/23 1714 12/07/23 0500  Weight: 126.1 kg (!) 153.2 kg    Recent Labs  Lab 12/05/23 1911 12/06/23 0551 12/07/23 0446 12/08/23 0453  BNP  --  27.1  --   --   BUN 12 12 12 13   CREATININE 0.68 0.51 0.73 0.67  K 3.2* 3.0* 3.6 3.6  MG  --  2.0  --   --     SLE: cont home Plaquenil .    Hypokalemia: Resolved  HTN: BP is well-controlled.  Continue current Lasix .    Peripheral neuropathy: Continue gabapentin   Morbid obesity: Patient's Body mass index is 52.9 kg/m. : Will benefit with PCP follow-up, weight loss  healthy lifestyle and outpatient sleep evaluation.   DVT prophylaxis: SCDs Start: 12/06/23 0517 Code Status:   Code Status: Full Code Family Communication: plan of care discussed with patient at bedside. Patient status is: Remains hospitalized because of severity of illness Level of care: Med-Surg   Dispo: The patient is from: home with daughter            Anticipated disposition: TBD Objective: Vitals last 24 hrs: Vitals:   12/07/23 0653 12/07/23 1340 12/07/23 2054 12/08/23 0625  BP: (!) 119/57 119/71 (!) 149/67 (!) 141/74  Pulse: 81 84 87 88  Resp: 18 18 18 18   Temp: 98.5 F (  36.9 C) 98.2 F (36.8 C) 98.3 F (36.8 C) 98.2 F (36.8 C)  TempSrc: Oral Oral Oral Oral  SpO2: 95% 99% 95% 96%  Weight:      Height:       Weight change:   Physical Examination: General exam: alert awake, oriented at baseline, older than stated age HEENT:Oral mucosa moist, Ear/Nose WNL  grossly Respiratory system: Bilaterally clear BS,no use of accessory muscle Cardiovascular system: S1 & S2 +, No JVD. Gastrointestinal system: Abdomen soft,NT,ND, BS+ Nervous System: Alert, awake, moving all extremities,and following commands. Extremities: LE edema present with mild erythema also tender bilaterally  Skin: No rashes,no icterus. MSK: Normal muscle bulk,tone, power   Medications reviewed:  Scheduled Meds:  furosemide   40 mg Intravenous Daily   gabapentin   300 mg Oral TID   hydroxychloroquine   200 mg Oral BID   Continuous Infusions:  cefTRIAXone  (ROCEPHIN )  IV 1 g (12/08/23 0551)   vancomycin  1,000 mg (12/08/23 0011)      Diet Order             Diet regular Room service appropriate? Yes; Fluid consistency: Thin  Diet effective now                  Intake/Output Summary (Last 24 hours) at 12/08/2023 1142 Last data filed at 12/08/2023 1028 Gross per 24 hour  Intake 1860 ml  Output 2650 ml  Net -790 ml   Net IO Since Admission: -492.43 mL [12/08/23 1142]  Wt Readings from Last 3 Encounters:  12/07/23 (!) 153.2 kg  11/30/23 132.5 kg  06/29/23 132.5 kg     Unresulted Labs (From admission, onward)     Start     Ordered   12/07/23 0500  Basic metabolic panel  Daily,   R      12/06/23 0733   12/07/23 0500  CBC  Daily,   R      12/06/23 0733          Data Reviewed: I have personally reviewed following labs and imaging studies CBC: Recent Labs  Lab 12/05/23 1911 12/06/23 0551 12/07/23 0446 12/08/23 0453  WBC 9.3 8.1 7.5 7.6  NEUTROABS 5.1 4.1  --   --   HGB 11.9* 10.9* 11.1* 10.8*  HCT 36.2 32.0* 34.8* 33.2*  MCV 97.6 99.1 100.6* 99.4  PLT 358 291 300 292   Basic Metabolic Panel:  Recent Labs  Lab 12/05/23 1911 12/06/23 0551 12/07/23 0446 12/08/23 0453  NA 141 138 137 138  K 3.2* 3.0* 3.6 3.6  CL 107 109 106 105  CO2 24 24 26 25   GLUCOSE 87 98 90 93  BUN 12 12 12 13   CREATININE 0.68 0.51 0.73 0.67  CALCIUM 9.1 8.4* 8.5* 8.7*  MG   --  2.0  --   --    GFR: Estimated Creatinine Clearance: 123.2 mL/min (by C-G formula based on SCr of 0.67 mg/dL). Liver Function Tests:  Recent Labs  Lab 12/05/23 1911 12/06/23 0551  AST 15 13*  ALT 14 11  ALKPHOS 70 62  BILITOT 0.2 0.5  PROT 7.4 6.6  ALBUMIN 3.5 3.2*  No results for input(s): "LIPASE", "AMYLASE" in the last 168 hours. No results for input(s): "AMMONIA" in the last 168 hours. Coagulation Profile:  Recent Labs  Lab 12/05/23 1911  INR 0.9  Sepsis Labs: Recent Labs  Lab 12/05/23 1911  LATICACIDVEN 0.9   Recent Results (from the past 240 hours)  Blood culture (routine x 2)  Status: None (Preliminary result)   Collection Time: 12/05/23  7:11 PM   Specimen: BLOOD  Result Value Ref Range Status   Specimen Description   Final    BLOOD RIGHT ANTECUBITAL Performed at [facility], [address] [place], [place] [address]    Special Requests   Final    BOTTLES DRAWN AEROBIC AND ANAEROBIC Blood Culture adequate volume Performed at [facility], [address] [place], [place] [address]    Culture   Final    NO GROWTH 3 DAYS Performed at [facility], [address] [place], [place] [address]    Report Status PENDING  Incomplete  Blood culture (routine x 2)     Status: None (Preliminary result)   Collection Time: 12/06/23  6:01 AM   Specimen: BLOOD  Result Value Ref Range Status   Specimen Description   Final    BLOOD SITE NOT SPECIFIED Performed at [facility], [address], [place], [place] [address]    Special Requests   Final    BOTTLES DRAWN AEROBIC AND ANAEROBIC Blood Culture results may not be optimal due to an inadequate volume of blood received in culture bottles Performed at [facility], [address] [place], [place] [address]    Culture   Final    NO GROWTH 2 DAYS Performed at [facility], [address], [place], [place] [address]    Report Status PENDING   Incomplete    Antimicrobials/Microbiology: Anti-infectives (From admission, onward)    Start     Dose/Rate Route Frequency Ordered Stop   12/06/23 2000  vancomycin  (VANCOCIN ) 2,250 mg in sodium chloride  0.9 % 500 mL IVPB  Status:  Discontinued        2,250 mg 261.3 mL/hr over 120 Minutes Intravenous Every 24 hours 12/06/23 0557 12/06/23 1123   12/06/23 1123  vancomycin  (VANCOCIN ) IVPB 1000 mg/200 mL premix        1,000 mg 200 mL/hr over 60 Minutes Intravenous Every 12 hours 12/06/23 1123     12/06/23 1000  hydroxychloroquine  (PLAQUENIL ) tablet 200 mg        200 mg Oral 2 times daily 12/06/23 0548     12/06/23 0630  cefTRIAXone  (ROCEPHIN ) 1 g in sodium chloride  0.9 % 100 mL IVPB        1 g 200 mL/hr over 30 Minutes Intravenous Every 24 hours 12/06/23 0543     12/05/23 2215  vancomycin  (VANCOCIN ) IVPB 1000 mg/200 mL premix       Placed in "Followed by" Linked Group   1,000 mg 200 mL/hr over 60 Minutes Intravenous  Once 12/05/23 2045 12/06/23 0012   12/05/23 2045  vancomycin  (VANCOCIN ) IVPB 1000 mg/200 mL premix       Placed in "Followed by" Linked Group   1,000 mg 200 mL/hr over 60 Minutes Intravenous  Once 12/05/23 2045 12/05/23 2230         Component Value Date/Time   SDES  12/06/2023 0601    BLOOD SITE NOT SPECIFIED Performed at [facility], [address] [place], [place] [address]    SPECREQUEST  12/06/2023 0601    BOTTLES DRAWN AEROBIC AND ANAEROBIC Blood Culture results may not be optimal due to an inadequate volume of blood received in culture bottles Performed at [facility], [address], [place], [place] [address]    CULT  12/06/2023 0601    NO GROWTH 2  DAYS Performed at [facility], [address], [place], [place] [address]    REPTSTATUS PENDING 12/06/2023 6213     Radiology Studies: ECHOCARDIOGRAM COMPLETE Result Date: 12/06/2023    ECHOCARDIOGRAM REPORT   Patient Name:   Sierra Atkins Date of Exam:  12/06/2023 Medical Rec #:  [id number]       Height:       67.0 in Accession #:    [id number]      Weight:       278.0 lb Date of Birth:  [birth year]      BSA:          2.326 m Patient Age:    57 years        BP:           137/74 mmHg Patient Gender: F               HR:           73 bpm. Exam Location:  Inpatient Procedure: 2D Echo, Cardiac Doppler, Color Doppler and Intracardiac            Opacification Agent Indications:    CHF-Acute Diastolic I50.31  History:        Patient has no prior history of Echocardiogram examinations.                 Risk Factors:Hypertension.  Sonographer:    Astrid Blamer Referring Phys: [id number] JUSTIN B HOWERTER IMPRESSIONS  1. Poor accoustic windows. Left ventricular ejection fraction, by estimation, is 60 to 65%. The left ventricle has normal function. The left ventricle has no regional wall motion abnormalities. Left ventricular diastolic parameters are indeterminate.  2. Right ventricular systolic function was not well visualized. The right ventricular size is not well visualized.  3. The mitral valve was not well visualized. No evidence of mitral valve regurgitation.  4. Aortic valve regurgitation is not visualized.  5. The inferior vena cava IVC is not well visualized. FINDINGS  Left Ventricle: Poor accoustic windows. Left ventricular ejection fraction, by estimation, is 60 to 65%. The left ventricle has normal function. The left ventricle has no regional wall motion abnormalities. Definity  contrast agent was given IV to delineate the left ventricular endocardial borders. The left ventricular internal cavity size was normal in size. Left ventricular diastolic parameters are indeterminate. Right Ventricle: The right ventricular size is not well visualized. Right ventricular systolic function was not well visualized. Left Atrium: Left atrial size was not well visualized. Right Atrium: Right atrial size was not well visualized. Pericardium: There is no evidence of pericardial effusion.  Mitral Valve: The mitral valve was not well visualized. No evidence of mitral valve regurgitation. Tricuspid Valve: Tricuspid valve regurgitation is not demonstrated. Aortic Valve: Aortic valve regurgitation is not visualized. Aortic valve peak gradient measures 21.3 mmHg. Pulmonic Valve: Pulmonic valve regurgitation is not visualized. Aorta: The aortic root and ascending aorta are structurally normal, with no evidence of dilitation. Venous: The inferior vena cava IVC is not well visualized. IAS/Shunts: The interatrial septum was not well visualized.  LEFT VENTRICLE PLAX 2D LVIDd:         3.60 cm LVIDs:         2.30 cm LV PW:         1.00 cm LV IVS:        1.60 cm LVOT diam:     2.10 cm LVOT Area:     3.46 cm  AORTIC VALVE AV Vmax:  231.00 cm/s AV Peak Grad: 21.3 mmHg  AORTA Ao Root diam: 3.30 cm  SHUNTS Systemic Diam: 2.10 cm Alois Arnt Electronically signed by Alois Arnt Signature Date/Time: 12/06/2023/3:20:11 PM    Final      LOS: 2 days   Total time spent in review of labs and imaging, patient evaluation, formulation of plan, documentation and communication with family: 35 minutes  Lesa Rape, MD  [facility]  12/08/2023, 11:42 AM

## 2023-12-08 NOTE — Plan of Care (Signed)
   Problem: Education: Goal: Knowledge of General Education information will improve Description Including pain rating scale, medication(s)/side effects and non-pharmacologic comfort measures Outcome: Progressing   Problem: Health Behavior/Discharge Planning: Goal: Ability to manage health-related needs will improve Outcome: Progressing

## 2023-12-09 DIAGNOSIS — L03116 Cellulitis of left lower limb: Secondary | ICD-10-CM | POA: Diagnosis not present

## 2023-12-09 LAB — BASIC METABOLIC PANEL
Anion gap: 9 (ref 5–15)
BUN: 15 mg/dL (ref 6–20)
CO2: 26 mmol/L (ref 22–32)
Calcium: 8.4 mg/dL — ABNORMAL LOW (ref 8.9–10.3)
Chloride: 100 mmol/L (ref 98–111)
Creatinine, Ser: 0.76 mg/dL (ref 0.44–1.00)
GFR, Estimated: >60 mL/min (ref >60)
Glucose, Bld: 95 mg/dL (ref 70–99)
Potassium: 4.1 mmol/L (ref 3.5–5.1)
Sodium: 135 mmol/L (ref 135–145)

## 2023-12-09 LAB — CBC
HCT: 32.8 % — ABNORMAL LOW (ref 36.0–46.0)
Hemoglobin: 10.6 g/dL — ABNORMAL LOW (ref 12.0–15.0)
MCH: 32.2 pg (ref 26.0–34.0)
MCHC: 32.3 g/dL (ref 30.0–36.0)
MCV: 99.7 fL (ref 80.0–100.0)
Platelets: 285 10*3/uL (ref 150–400)
RBC: 3.29 MIL/uL — ABNORMAL LOW (ref 3.87–5.11)
RDW: 13.4 % (ref 11.5–15.5)
WBC: 6.9 10*3/uL (ref 4.0–10.5)
nRBC: 0 % (ref 0.0–0.2)

## 2023-12-09 LAB — BRAIN NATRIURETIC PEPTIDE: B Natriuretic Peptide: 29 pg/mL (ref 0.0–100.0)

## 2023-12-09 MED ORDER — OXYCODONE HCL 5 MG PO TABS
5.0000 mg | ORAL_TABLET | Freq: Four times a day (QID) | ORAL | Status: DC | PRN
  Administered 2023-12-09 – 2023-12-10 (×2): 5 mg via ORAL
  Filled 2023-12-09 (×3): qty 1

## 2023-12-09 NOTE — Plan of Care (Signed)
  Problem: Education: Goal: Knowledge of General Education information will improve Description Including pain rating scale, medication(s)/side effects and non-pharmacologic comfort measures Outcome: Progressing   Problem: Health Behavior/Discharge Planning: Goal: Ability to manage health-related needs will improve Outcome: Progressing   

## 2023-12-09 NOTE — [place] FL2 (Signed)
[place] MEDICA[place] FL2 LEVEL OF CARE FORM     IDENTIFICATION  Patient Name: Sierra Atkins Birthdate: [birth year] Sex: female Admission Date (Current Location): 12/05/2023  [facility] and [place] Number:  Producer, television/film/video and Address:  [facility],  [address] [place] [address]      Provider Number: [id number]  Attending Physician Name and Address:  Lanae Boast, MD  Relative Name and Phone Number:  Kathlena, Dalmau (Daughter)  [phone] [facility])    Current Level of Care: Hospital Recommended Level of Care: Skilled Nursing Facility Prior Approval Number:    Date Approved/Denied:   PASRR Number: [id number]  Discharge Plan: SNF    Current Diagnoses: Patient Active Problem List   Diagnosis Date Noted   Hypokalemia 12/06/2023   Peripheral polyneuropathy 12/06/2023   Essential hypertension 12/06/2023   Lupus (systemic lupus erythematosus) (HCC) 12/06/2023   Cellulitis of left lower extremity 12/05/2023    Orientation RESPIRATION BLADDER Height & Weight     Self, Time, Situation, Place  Normal Incontinent, External catheter Weight: (!) 151.7 kg Height:  5\' 7"  (170.2 cm)  BEHAVIORAL SYMPTOMS/MOOD NEUROLOGICAL BOWEL NUTRITION STATUS      Continent Diet  AMBULATORY STATUS COMMUNICATION OF NEEDS Skin   Limited Assist Verbally Skin abrasions (right thigh)                       Personal Care Assistance Level of Assistance  Bathing, Feeding, Dressing Bathing Assistance: Limited assistance Feeding assistance: Limited assistance Dressing Assistance: Limited assistance     Functional Limitations Info  Sight, Hearing, Speech Sight Info: Adequate Hearing Info: Adequate Speech Info: Adequate    SPECIAL CARE FACTORS FREQUENCY  PT (By licensed PT), OT (By licensed OT)     PT Frequency: 5x/wk OT Frequency: 5x/wk            Contractures Contractures Info: Not present    Additional Factors Info  Code Status, Allergies, Psychotropic  Code Status Info: Full Code Allergies Info: Fentanyl, Codeine, Morphine And Codeine Psychotropic Info: N/A         Current Medications (12/09/2023):  This is the current hospital active medication list Current Facility-Administered Medications  Medication Dose Route Frequency Provider Last Rate Last Admin   acetaminophen (TYLENOL) tablet 650 mg  650 mg Oral Q6H PRN Howerter, Justin B, DO   650 mg at 12/08/23 2347   Or   acetaminophen (TYLENOL) suppository 650 mg  650 mg Rectal Q6H PRN Howerter, Justin B, DO       ceFAZolin (ANCEF) IVPB 2g/100 mL premix  2 g Intravenous Q8H Kc, Ramesh, MD 200 mL/hr at 12/09/23 0503 2 g at 12/09/23 0503   diclofenac Sodium (VOLTAREN) 1 % topical gel 2 g  2 g Topical QID PRN Lanae Boast, MD   2 g at 12/07/23 1200   furosemide (LASIX) injection 40 mg  40 mg Intravenous BID Kc, Dayna Barker, MD   40 mg at 12/09/23 0942   gabapentin (NEURONTIN) capsule 300 mg  300 mg Oral TID Howerter, Justin B, DO   300 mg at 12/09/23 0941   HYDROmorphone (DILAUDID) injection 1 mg  1 mg Intravenous Q2H PRN Howerter, Justin B, DO   1 mg at 12/09/23 1149   hydroxychloroquine (PLAQUENIL) tablet 200 mg  200 mg Oral BID Howerter, Justin B, DO   200 mg at 12/09/23 0942   melatonin tablet 3 mg  3 mg Oral QHS PRN Howerter, Justin B, DO       naloxone (  NARCAN) injection 0.4 mg  0.4 mg Intravenous PRN Howerter, Justin B, DO       ondansetron (ZOFRAN) injection 4 mg  4 mg Intravenous Q6H PRN Howerter, Justin B, DO       oxyCODONE (Oxy IR/ROXICODONE) immediate release tablet 5 mg  5 mg Oral Q6H PRN Kc, Ramesh, MD       sodium chloride (OCEAN) 0.65 % nasal spray 1 spray  1 spray Each Nare PRN Kc, Ramesh, MD   1 spray at 12/07/23 2322   zolpidem (AMBIEN) tablet 5 mg  5 mg Oral QHS PRN Anthoney Harada, NP   5 mg at 12/08/23 2102     Discharge Medications: Please see discharge summary for a list of discharge medications.  Relevant Imaging Results:  Relevant Lab Results:   Additional  Information SSN: [id number]  Howell Rucks, RN

## 2023-12-09 NOTE — Progress Notes (Signed)
PROGRESS NOTE Sierra Atkins  ZOX:[id number] DOB: February 19, 1966 DOA: 12/05/2023 PCP: Default, Provider, MD  Brief Narrative/Hospital Course: 58 y.o. female with medical history significant for essential hypertension on Exforge, Coreg, Lasix, HCTZ, peripheral polyneuropathy, lupus presenting with 10 days of progressive LUL EEL edema after left knee, swelling warmth.  She was diagnosed with left lower extremity cellulitis completed 5 days of oral doxycycline from 11/30/2023 but without improvement.  However onset of symptoms was 3 weeks ago with worsening edema in the bilateral extremities had venous ultrasound on 12/13 negative for DVT. Seen at the med [facility], in the ED vitals fairly stable not hypoxic Labs with hypokalemia normal lactate WBC count.  Blood culture sent, imaging with chest x-ray-mild interstitial prominence -smoking-related versus edema.  Left leg x-ray with infiltrative subcutaneous edema throughout calf, total knee prosthesis.  Patient was given IV Lasix, pain medication Toradol vancomycin and admitted.    Subjective: Seen this morning on the bedside chair with nursing assistance and returned to bed Bilateral feet still swollen tender painful Overnight afebrile BP stable renal function remains stable tolerating Lasix. Wt at 337 lb on admit ( pt stated weight was 278 pound at baseline previously ?) wt down to 334   Assessment and Plan: Principal Problem:   Cellulitis of left lower extremity Active Problems:   Hypokalemia   Peripheral polyneuropathy   Essential hypertension   Lupus (systemic lupus erythematosus) (HCC)  Moderate severe cellulitis LE B/L Lower leg edema: Failed outpatient oral doxycycline.Duplex negative for DVT 11/22/23. BNP normal-could be false due to obesity Echo  done 1/13 showed EF 60 to 65%, diastolic parameters indeterminate, right ventricular size is not well-visualized, poor acoustic windows-?  If patient has diastolic dysfunction.  Recheck  BNP. Continue on IV Lasix 40 mg bid and monitor strict intake output, weight which is improving, Continue to monitor renal function.  Keep low-salt and fluid restricted diet MRSA PCR negative given non purulence> Vanco/ceftriaxone switched to Ancef 1/15 to avoid renal toxicity Net IO Since Admission: -162.43 mL [12/09/23 1148]  Filed Weights   12/05/23 1714 12/07/23 0500 12/09/23 0717  Weight: 126.1 kg (!) 153.2 kg (!) 151.7 kg    SLE: Stable cont home Plaquenil.    Hypokalemia: Resolved.   HTN: BP is well-controlled. Cont iv lasix as above  Peripheral neuropathy: Continue gabapentin  Morbid obesity: Patient's Body mass index is 52.38 kg/m. : Will benefit with PCP follow-up, weight loss  healthy lifestyle and outpatient sleep evaluation.  Deconditioning/debility: PT OT following plan for skilled nursing facility once stable.She lives outside of town has moved to daughter home recently  DVT prophylaxis: SCDs Start: 12/06/23 0517 Code Status:   Code Status: Full Code Family Communication: plan of care discussed with patient at bedside. Patient status is: Remains hospitalized because of severity of illness Level of care: Med-Surg   Dispo: The patient is from: home with daughter            Anticipated disposition:  SNF >2 days  Objective: Vitals last 24 hrs: Vitals:   12/08/23 1142 12/08/23 2239 12/09/23 0636 12/09/23 0717  BP: (!) 132/93 (!) 140/80 (!) 133/47   Pulse: 92 75 80   Resp:  18 18   Temp: 98.2 F (36.8 C) 98.8 F (37.1 C) 98.4 F (36.9 C)   TempSrc:   Oral   SpO2: 96% 96% 96%   Weight:    (!) 151.7 kg  Height:       Weight change:   Physical Examination: General  exam: alert awake, oriented  HEENT:Oral mucosa moist, Ear/Nose WNL grossly Respiratory system: Bilaterally clear BS,no use of accessory muscle Cardiovascular system: S1 & S2 +, No JVD. Gastrointestinal system: Abdomen soft,NT,ND, BS+ Nervous System: Alert, awake, moving all extremities,and  following commands. Extremities: LE b/l swollen tender with mild erythema,distal peripheral pulses palpable and warm.  Skin: No rashes,no icterus. MSK: Normal muscle bulk,tone, power   Medications reviewed:  Scheduled Meds:  furosemide  40 mg Intravenous BID   gabapentin  300 mg Oral TID   hydroxychloroquine  200 mg Oral BID   Continuous Infusions:   ceFAZolin (ANCEF) IV 2 g (12/09/23 0503)      Diet Order             Diet regular Room service appropriate? Yes; Fluid consistency: Thin  Diet effective now                  Intake/Output Summary (Last 24 hours) at 12/09/2023 1148 Last data filed at 12/09/2023 1000 Gross per 24 hour  Intake 1180 ml  Output 850 ml  Net 330 ml   Net IO Since Admission: -162.43 mL [12/09/23 1148]  Wt Readings from Last 3 Encounters:  12/09/23 (!) 151.7 kg  11/30/23 132.5 kg  06/29/23 132.5 kg     Unresulted Labs (From admission, onward)     Start     Ordered   12/07/23 0500  Basic metabolic panel  Daily,   R      12/06/23 0733          Data Reviewed: I have personally reviewed following labs and imaging studies CBC: Recent Labs  Lab 12/05/23 1911 12/06/23 0551 12/07/23 0446 12/08/23 0453 12/09/23 0443  WBC 9.3 8.1 7.5 7.6 6.9  NEUTROABS 5.1 4.1  --   --   --   HGB 11.9* 10.9* 11.1* 10.8* 10.6*  HCT 36.2 32.0* 34.8* 33.2* 32.8*  MCV 97.6 99.1 100.6* 99.4 99.7  PLT 358 291 300 292 285   Basic Metabolic Panel:  Recent Labs  Lab 12/05/23 1911 12/06/23 0551 12/07/23 0446 12/08/23 0453 12/09/23 0443  NA 141 138 137 138 135  K 3.2* 3.0* 3.6 3.6 4.1  CL 107 109 106 105 100  CO2 24 24 26 25 26   GLUCOSE 87 98 90 93 95  BUN 12 12 12 13 15   CREATININE 0.68 0.51 0.73 0.67 0.76  CALCIUM 9.1 8.4* 8.5* 8.7* 8.4*  MG  --  2.0  --   --   --    GFR: Estimated Creatinine Clearance: 122.4 mL/min (by C-G formula based on SCr of 0.76 mg/dL). Liver Function Tests:  Recent Labs  Lab 12/05/23 1911 12/06/23 0551  AST 15 13*  ALT  14 11  ALKPHOS 70 62  BILITOT 0.2 0.5  PROT 7.4 6.6  ALBUMIN 3.5 3.2*  No results for input(s): "LIPASE", "AMYLASE" in the last 168 hours. No results for input(s): "AMMONIA" in the last 168 hours. Coagulation Profile:  Recent Labs  Lab 12/05/23 1911  INR 0.9  Sepsis Labs: Recent Labs  Lab 12/05/23 1911  LATICACIDVEN 0.9   Recent Results (from the past 240 hours)  Blood culture (routine x 2)     Status: None (Preliminary result)   Collection Time: 12/05/23  7:11 PM   Specimen: BLOOD  Result Value Ref Range Status   Specimen Description   Final    BLOOD RIGHT ANTECUBITAL Performed at [facility], [address], [place], [place] [address]  Special Requests   Final    BOTTLES DRAWN AEROBIC AND ANAEROBIC Blood Culture adequate volume Performed at [facility], [address] [place], [place] [address]    Culture   Final    NO GROWTH 4 DAYS Performed at [facility], [address] [place], [place] [address]    Report Status PENDING  Incomplete  Blood culture (routine x 2)     Status: None (Preliminary result)   Collection Time: 12/06/23  6:01 AM   Specimen: BLOOD  Result Value Ref Range Status   Specimen Description   Final    BLOOD SITE NOT SPECIFIED Performed at [facility], [address], [place], [place] [address]    Special Requests   Final    BOTTLES DRAWN AEROBIC AND ANAEROBIC Blood Culture results may not be optimal due to an inadequate volume of blood received in culture bottles Performed at [facility], [address] [place], [place] [address]    Culture   Final    NO GROWTH 3 DAYS Performed at [facility], [address] [place], [place] [address]    Report Status PENDING  Incomplete  MRSA Next Gen by PCR, Nasal     Status: None   Collection Time: 12/08/23 11:52 AM   Specimen: Nasal Mucosa; Nasal Swab  Result Value Ref Range Status   MRSA by PCR Next Gen NOT DETECTED NOT  DETECTED Final    Comment: (NOTE) The GeneXpert MRSA Assay (FDA approved for NASAL specimens only), is one component of a comprehensive MRSA colonization surveillance program. It is not intended to diagnose MRSA infection nor to guide or monitor treatment for MRSA infections. Test performance is not FDA approved in patients less than 36 years old. Performed at [facility] [place] [facility], [address], [place], [place] [address]     Antimicrobials/Microbiology: Anti-infectives (From admission, onward)    Start     Dose/Rate Route Frequency Ordered Stop   12/09/23 0600  ceFAZolin (ANCEF) IVPB 2g/100 mL premix        2 g 200 mL/hr over 30 Minutes Intravenous Every 8 hours 12/08/23 1410     12/06/23 2000  vancomycin (VANCOCIN) 2,250 mg in sodium chloride 0.9 % 500 mL IVPB  Status:  Discontinued        2,250 mg 261.3 mL/hr over 120 Minutes Intravenous Every 24 hours 12/06/23 0557 12/06/23 1123   12/06/23 1123  vancomycin (VANCOCIN) IVPB 1000 mg/200 mL premix  Status:  Discontinued        1,000 mg 200 mL/hr over 60 Minutes Intravenous Every 12 hours 12/06/23 1123 12/08/23 1410   12/06/23 1000  hydroxychloroquine (PLAQUENIL) tablet 200 mg        200 mg Oral 2 times daily 12/06/23 0548     12/06/23 0630  cefTRIAXone (ROCEPHIN) 1 g in sodium chloride 0.9 % 100 mL IVPB  Status:  Discontinued        1 g 200 mL/hr over 30 Minutes Intravenous Every 24 hours 12/06/23 0543 12/08/23 1410   12/05/23 2215  vancomycin (VANCOCIN) IVPB 1000 mg/200 mL premix       Placed in "Followed by" Linked Group   1,000 mg 200 mL/hr over 60 Minutes Intravenous  Once 12/05/23 2045 12/06/23 0012   12/05/23 2045  vancomycin (VANCOCIN) IVPB 1000 mg/200 mL premix       Placed in "Followed by" Linked Group   1,000 mg 200 mL/hr over 60 Minutes  Intravenous  Once 12/05/23 2045 12/05/23 2230         Component Value Date/Time   SDES  12/06/2023 0601    BLOOD SITE NOT SPECIFIED Performed at [facility], [address], [place], [place] [address]    SPECREQUEST  12/06/2023 0601    BOTTLES DRAWN AEROBIC AND ANAEROBIC Blood Culture results may not be optimal due to an inadequate volume of blood received in culture bottles Performed at [facility], [address], [place], [place] [address]    CULT  12/06/2023 0601    NO GROWTH 3 DAYS Performed at [facility], [address] [place], [place] [address]    REPTSTATUS PENDING 12/06/2023 0601     Radiology Studies: DG Knee 1-2 Views Left Result Date: 12/08/2023 CLINICAL DATA:  Prior knee surgery in 2011.  Knee pain. EXAM: LEFT KNEE - 1-2 VIEW COMPARISON:  Left tibia and fibula radiographs 12/05/2023 FINDINGS: Status post total left knee arthroplasty. No perihardware lucency is seen to indicate hardware failure or loosening. No knee joint effusion. Mild chronic enthesopathic change at the quadriceps insertion on the patella. No acute fracture dislocation. Mild diffuse subcutaneous fat edema and swelling, similar to prior. No subcutaneous air. IMPRESSION: Status post total left knee arthroplasty without evidence of hardware failure. Electronically Signed   By: Neita Garnet M.D.   On: 12/08/2023 14:15     LOS: 3 days   Total time spent in review of labs and imaging, patient evaluation, formulation of plan, documentation and communication with family: 35 minutes  Lanae Boast, MD  [facility]  12/09/2023, 11:48 AM

## 2023-12-09 NOTE — Progress Notes (Signed)
Mobility Specialist - Progress Note   12/09/23 0918  Mobility  Activity Ambulated with assistance in hallway  Level of Assistance Standby assist, set-up cues, supervision of patient - no hands on  Assistive Device Front wheel walker  Distance Ambulated (ft) 120 ft  Activity Response Tolerated well  Mobility Referral Yes  Mobility visit 1 Mobility  Mobility Specialist Start Time (ACUTE ONLY) 0831  Mobility Specialist Stop Time (ACUTE ONLY) 0848  Mobility Specialist Time Calculation (min) (ACUTE ONLY) 17 min   Pt received in bed and agreeable to mobility. Pt was minA from supine>sitting & STS. During ambulation, pt c/o legs "throbbing" prompting in a seated rest break. No other complaints during session. Pt to bed after session with all needs met.    [facility]

## 2023-12-09 NOTE — Plan of Care (Signed)

## 2023-12-09 NOTE — TOC Progression Note (Addendum)
Transition of Care [facility]) - Progression Note    Patient Details  Name: Sierra Atkins MRN: [id number] Date of Birth: [birth year]  Transition of Care [facility]) CM/SW Contact  Howell Rucks, RN Phone Number: 12/09/2023, 11:30 AM  Clinical Narrative: PT eval completed, recommendation changed to short term rehab/SNF, pt agreeable, no preference, will initiate SNF process.    -1:07pm FL2 updated, PASRR   [phone] [id number], faxed out for bed offers.TOC will continue to follow. [facility] notified to cancel [facility] PT.    Expected Discharge Plan: Home/Self Care Barriers to Discharge: Continued Medical Work up  Expected Discharge Plan and Services       Living arrangements for the past 2 months: Single Family Home                                       Social Determinants of Health (SDOH) Interventions SDOH Screenings   Food Insecurity: No Food Insecurity (12/06/2023)  Housing: Unknown (12/06/2023)  Transportation Needs: No Transportation Needs (12/06/2023)  Utilities: Not At Risk (12/06/2023)  Social Connections: Unknown (12/06/2023)  Stress: No Stress Concern Present (08/13/2020)   Received from [facility]  Tobacco Use: High Risk (12/06/2023)    Readmission Risk Interventions    12/06/2023   11:51 AM  Readmission Risk Prevention Plan  Transportation Screening Complete  PCP or Specialist Appt within 5-7 Days Complete  Home Care Screening Complete  Medication Review (RN CM) Complete

## 2023-12-10 DIAGNOSIS — L03116 Cellulitis of left lower limb: Secondary | ICD-10-CM | POA: Diagnosis not present

## 2023-12-10 LAB — CULTURE, BLOOD (ROUTINE X 2)
Culture: NO GROWTH
Special Requests: ADEQUATE

## 2023-12-10 LAB — BASIC METABOLIC PANEL
Anion gap: 9 (ref 5–15)
BUN: 16 mg/dL (ref 6–20)
CO2: 28 mmol/L (ref 22–32)
Calcium: 8.8 mg/dL — ABNORMAL LOW (ref 8.9–10.3)
Chloride: 102 mmol/L (ref 98–111)
Creatinine, Ser: 0.69 mg/dL (ref 0.44–1.00)
GFR, Estimated: >60 mL/min (ref >60)
Glucose, Bld: 96 mg/dL (ref 70–99)
Potassium: 3.8 mmol/L (ref 3.5–5.1)
Sodium: 139 mmol/L (ref 135–145)

## 2023-12-10 MED ORDER — FUROSEMIDE 10 MG/ML IJ SOLN
20.0000 mg | Freq: Once | INTRAMUSCULAR | Status: AC
  Administered 2023-12-10: 20 mg via INTRAVENOUS
  Filled 2023-12-10: qty 2

## 2023-12-10 MED ORDER — FUROSEMIDE 10 MG/ML IJ SOLN
60.0000 mg | Freq: Two times a day (BID) | INTRAMUSCULAR | Status: DC
Start: 2023-12-10 — End: 2023-12-11
  Administered 2023-12-10 – 2023-12-11 (×2): 60 mg via INTRAVENOUS
  Filled 2023-12-10 (×2): qty 6

## 2023-12-10 MED ORDER — HYDROMORPHONE HCL 1 MG/ML IJ SOLN
0.5000 mg | Freq: Four times a day (QID) | INTRAMUSCULAR | Status: DC | PRN
  Administered 2023-12-10 – 2023-12-16 (×23): 0.5 mg via INTRAVENOUS
  Filled 2023-12-10 (×23): qty 0.5

## 2023-12-10 MED ORDER — HYDROXYZINE HCL 10 MG PO TABS
10.0000 mg | ORAL_TABLET | Freq: Three times a day (TID) | ORAL | Status: DC | PRN
  Administered 2023-12-10 – 2023-12-15 (×9): 10 mg via ORAL
  Filled 2023-12-10 (×13): qty 1

## 2023-12-10 MED ORDER — OXYCODONE HCL 5 MG PO TABS
10.0000 mg | ORAL_TABLET | Freq: Four times a day (QID) | ORAL | Status: DC | PRN
  Administered 2023-12-10 – 2023-12-16 (×18): 10 mg via ORAL
  Filled 2023-12-10 (×21): qty 2

## 2023-12-10 NOTE — Progress Notes (Signed)
PT Cancellation Note  Patient Details Name: Sierra Atkins MRN: [id number] DOB: 1966/03/15   Cancelled Treatment:    Reason Eval/Treat Not Completed: Other (comment) (pt stated she's urinating frequently 2* having lasix, so doesn't want to walk at present. Will follow.)   Tamala Ser PT 12/10/2023  Acute Rehabilitation Services  Office [phone]

## 2023-12-10 NOTE — Progress Notes (Signed)
Physical Therapy Treatment Patient Details Name: Sierra Atkins MRN: [id number] DOB: 22-Mar-1966 Today's Date: 12/10/2023   History of Present Illness 58 y.o. female  brought to [facility] with LE cellulitis, edema ,recently treated with no improvement. PMH: essential hypertension ,peripheral polyneuropathy, Bil. Hip DJD, LTKA craniotomy for benign tumor. Legs  Negative for DVT.    PT Comments  Pt declined ambulation 2* frequent urination 2* being on lasix, she agreed to chair level exercises. Instructed pt in seated BUE strengthening exercises with green theraband, and BLE seated strengthening exercises. Pt puts forth good effort.     If plan is discharge home, recommend the following: A little help with walking and/or transfers;A little help with bathing/dressing/bathroom;Assist for transportation;Assistance with cooking/housework;Help with stairs or ramp for entrance   Can travel by private vehicle        Equipment Recommendations  None recommended by PT    Recommendations for Other Services       Precautions / Restrictions Precautions Precautions: Fall Restrictions Weight Bearing Restrictions Per Provider Order: No     Mobility  Bed Mobility                    Transfers                   General transfer comment: pt declined mobility 2* frequent urination 2* lasix. agreed to seated exercises.    Ambulation/Gait                   Stairs             Wheelchair Mobility     Tilt Bed    Modified Rankin (Stroke Patients Only)       Balance                                            Cognition Arousal: Alert Behavior During Therapy: WFL for tasks assessed/performed Overall Cognitive Status: Within Functional Limits for tasks assessed                                 General Comments: pleasant, jovial        Exercises General Exercises - Upper Extremity Shoulder Flexion: AROM, Both, 10 reps, Seated,  Theraband Theraband Level (Shoulder Flexion): Level 3 (Green) Elbow Flexion: AROM, Both, 10 reps, Theraband, Seated Theraband Level (Elbow Flexion): Level 3 (Green) Elbow Extension: AROM, Both, 10 reps, Theraband, Seated Theraband Level (Elbow Extension): Level 3 (Green) General Exercises - Lower Extremity Ankle Circles/Pumps: AROM, Both, 15 reps, Seated Long Arc Quad: AROM, Both, 20 reps, Seated Hip Flexion/Marching: AROM, Both, 10 reps, Seated Quad sets x 5 both supine AROM   General Comments        Pertinent Vitals/Pain Pain Assessment Pain Score: 0-No pain    Home Living                          Prior Function            PT Goals (current goals can now be found in the care plan section) Acute Rehab PT Goals Patient Stated Goal: to get stronger at rehab, then  get hip replacements PT Goal Formulation: With patient Time For Goal Achievement: 12/20/23 Potential to Achieve Goals: Good Progress towards PT goals: Progressing  toward goals    Frequency    Min 1X/week      PT Plan      Co-evaluation              AM-PAC PT "6 Clicks" Mobility   Outcome Measure  Help needed turning from your back to your side while in a flat bed without using bedrails?: A Little Help needed moving from lying on your back to sitting on the side of a flat bed without using bedrails?: A Little Help needed moving to and from a bed to a chair (including a wheelchair)?: A Little Help needed standing up from a chair using your arms (e.g., wheelchair or bedside chair)?: A Little Help needed to walk in hospital room?: A Little Help needed climbing 3-5 steps with a railing? : A Lot 6 Click Score: 17    End of Session Equipment Utilized During Treatment: Gait belt Activity Tolerance: Patient tolerated treatment well Patient left: in chair;with call bell/phone within reach;with chair alarm set Nurse Communication: Mobility status PT Visit Diagnosis: Unsteadiness on feet  (R26.81);Pain     Time: 2130-8657 PT Time Calculation (min) (ACUTE ONLY): 20 min  Charges:   Tamala Ser PT 12/10/2023  Acute Rehabilitation Services  Office [phone]                     Tamala Ser PT 12/10/2023  Acute Rehabilitation Services  Office [phone]

## 2023-12-10 NOTE — Progress Notes (Signed)
Occupational Therapy Treatment Patient Details Name: Sierra Atkins MRN: [id number] DOB: [birth year] Today's Date: 12/10/2023   History of present illness 58 y.o. female  brought to [facility] with LE cellulitis, edema ,recently treated with no improvement. PMH: essential hypertension ,peripheral polyneuropathy, Bil. Hip DJD, LTKA craniotomy for benign tumor. Legs  Negative for DVT.   OT comments  Patient progressing and showed improved tolerance for performing standing ADLs with no more than CGA for dynamic standing balance with RW, compared to previous session with pt tolerating more seated and EOB activity.  Patient remains limited by intermittent LE pain but denied pain today, as well as morbid obesity making it difficult for pt to thoroughly perform hygiene and LE dressing, generalized weakness and decreased activity tolerance with mild cognitive deficits, along with deficits noted below. Pt continues to demonstrate good rehab potential and would benefit from continued skilled OT to increase safety and independence with ADLs and functional transfers to allow pt to return home safely and reduce caregiver burden and fall risk.       If plan is discharge home, recommend the following:  A lot of help with bathing/dressing/bathroom;A little help with walking and/or transfers;Assistance with cooking/housework;Assist for transportation;Help with stairs or ramp for entrance   Equipment Recommendations  Tub/shower bench    Recommendations for Other Services      Precautions / Restrictions Precautions Precautions: Fall Restrictions Weight Bearing Restrictions Per Provider Order: No       Mobility Bed Mobility         Supine to sit: Supervision, Used rails, HOB elevated (Increased time and effort)          Transfers Overall transfer level: Needs assistance Equipment used: Rolling walker (2 wheels) Transfers: Sit to/from Stand, Bed to chair/wheelchair/BSC Sit to Stand: Supervision,  Contact guard assist           General transfer comment: Pt stood from EOB to RW x 3 as needed for bathing and LE dressing. Pt required supervision to allow for cues for safetyand hand placement. CGA for pivot  recliner with RW.     Balance Overall balance assessment: Needs assistance Sitting-balance support: Feet supported, No upper extremity supported Sitting balance-Leahy Scale: Good     Standing balance support: Bilateral upper extremity supported, Reliant on assistive device for balance, During functional activity Standing balance-Leahy Scale: Fair                             ADL either performed or assessed with clinical judgement   ADL Overall ADL's : Needs assistance/impaired Eating/Feeding: Independent   Grooming: Wash/dry hands;Wash/dry face;Sitting;Set up Grooming Details (indicate cue type and reason): For sake of time, pt was assisted with bathing due to need of standing, and pt setup with all grooming supplies once in recliner to finish up on her own Upper Body Bathing: Set up;Sitting   Lower Body Bathing: Maximal assistance;Sit to/from stand Lower Body Bathing Details (indicate cue type and reason): Pt able to balance well with BUE support on RW to allow OT to perform needed peri hygiene. Pt reports that she can typically cleanse her anterior peri areas but had a PIV that was coming untaped and asked not to use that arm due to fear it would dislodge and pt reports other non-dominant hand cannot thoroughly clear area. Upper Body Dressing : Supervision/safety;Sitting;Set up       Toilet Transfer: Supervision/safety;Rolling walker (2 wheels)   Toileting- Clothing Manipulation and Hygiene:  Moderate assistance       Functional mobility during ADLs: Contact guard assist;Supervision/safety;Rolling walker (2 wheels)      Extremity/Trunk Assessment Upper Extremity Assessment Upper Extremity Assessment: Overall WFL for tasks assessed   Lower Extremity  Assessment Lower Extremity Assessment: Defer to PT evaluation        Vision Baseline Vision/History: 1 Wears glasses Ability to See in Adequate Light: 0 Adequate     Perception     Praxis      Cognition Arousal: Alert Behavior During Therapy: WFL for tasks assessed/performed Overall Cognitive Status: Within Functional Limits for tasks assessed                                 General Comments: pleasant, jovial and hyperverbose        Exercises      Shoulder Instructions       General Comments      Pertinent Vitals/ Pain       Pain Assessment Pain Assessment: No/denies pain Pain Location: Pt reports BLE pain with some ambulation and standing but pt continued to deny any pain despite prolonged standing for LB bathing. Pain Intervention(s): Monitored during session  Home Living                                          Prior Functioning/Environment              Frequency  Min 1X/week        Progress Toward Goals  OT Goals(current goals can now be found in the care plan section)  Progress towards OT goals: Progressing toward goals  Acute Rehab OT Goals Patient Stated Goal: Home and HH therapy OT Goal Formulation: With patient Time For Goal Achievement: 12/21/23 Potential to Achieve Goals: Good  Plan      Co-evaluation                 AM-PAC OT "6 Clicks" Daily Activity     Outcome Measure                    End of Session Equipment Utilized During Treatment: Rolling walker (2 wheels)  OT Visit Diagnosis: Other abnormalities of gait and mobility (R26.89);Muscle weakness (generalized) (M62.81);Pain   Activity Tolerance Patient tolerated treatment well   Patient Left in chair;with call bell/phone within reach (LEs elevated)   Nurse Communication Other (comment) (Sponge bath completed)        Time: 2952-8413 OT Time Calculation (min): 43 min  Charges: OT General Charges $OT Visit: 1  Visit OT Treatments $Self Care/Home Management : 23-37 mins $Therapeutic Activity: 8-22 mins  Victorino Dike, OT Acute Rehab Services Office: [phone] 12/10/2023   Theodoro Clock 12/10/2023, 12:00 PM

## 2023-12-10 NOTE — Progress Notes (Signed)
PROGRESS NOTE Sierra Atkins  ZOX:[id number] DOB: May 18, 1966 DOA: 12/05/2023 PCP: Default, Provider, MD  Brief Narrative/Hospital Course: 58 y.o. female with medical history significant for essential hypertension on Exforge, Coreg, Lasix, HCTZ, peripheral polyneuropathy, lupus presenting with 10 days of progressive LUL EEL edema after left knee, swelling warmth.  She was diagnosed with left lower extremity cellulitis completed 5 days of oral doxycycline from 11/30/2023 but without improvement.  However onset of symptoms was 3 weeks ago with worsening edema in the bilateral extremities had venous ultrasound on 12/13 negative for DVT. Seen at the med [facility], in the ED vitals fairly stable not hypoxic Labs with hypokalemia normal lactate WBC count.  Blood culture sent, imaging with chest x-ray-mild interstitial prominence -smoking-related versus edema.  Left leg x-ray with infiltrative subcutaneous edema throughout calf, total knee prosthesis.  Patient was given IV Lasix, pain medication Toradol vancomycin and admitted.    Subjective:  Overnight afebrile, renal function remains stable  Wt at 337 lb on admit ( pt stated weight was 278 lb at baseline previously ?) wt down to 334> wt check pending today   Assessment and Plan: Principal Problem:   Cellulitis of left lower extremity Active Problems:   Hypokalemia   Peripheral polyneuropathy   Essential hypertension   Lupus (systemic lupus erythematosus) (HCC)  Moderate severe cellulitis LE B/L Lower leg edema Suspect acute on chronic diastolic CHF: Failed outpatient oral doxycycline.Duplex negative for DVT 11/22/23. BNP normal-could be false due to obesity Echo  done 1/13 showed EF 60 to 65%, diastolic parameters indeterminate, right ventricular size is not well-visualized, poor acoustic windows- likely has chronic CHF (d), at home on PO Laix 40mg  bid, was admitted in [facility] in sept 24 with similar presentations. Will increase lasix to 60mg   iv bid as swelling is still persistent. Will continue current antibiotics-MRSA PCR negative given non purulence> Vanco/ceftriaxone switched to Ancef 1/15 to avoid renal toxicity. Mminimize IV dilaudid, resume home po oxy 10mg  Continue to monitor renal function.  Keep low-salt and fluid restricted diet Net IO Since Admission: -1,162.43 mL [12/10/23 0838]  Filed Weights   12/05/23 1714 12/07/23 0500 12/09/23 0717  Weight: 126.1 kg (!) 153.2 kg (!) 151.7 kg    SLE: Stable cont home Plaquenil.    Hypokalemia: Resolved.  Continue to monitor  HTN: BP is well-controlled. Cont iv lasix as above  Peripheral neuropathy Chronic bilateral hip pain Chronic opiate use: Continue gabapentin, pain meds and minimize narcotic use. States she is on Oxy 10 mg prn q6hr at home and takes norco for breakthrough pain as well.  Continue narcotics with caution  Morbid obesity Body mass index is 52.38 kg/m. : Will benefit with PCP follow-up, weight loss  healthy lifestyle and outpatient sleep evaluation.  Deconditioning/debility: PT OT following plan for skilled nursing facility once stable.She lives outside of town has moved to daughter home recently  DVT prophylaxis: SCDs Start: 12/06/23 0517 Code Status:   Code Status: Full Code Family Communication: plan of care discussed with patient at bedside. Daughter updated on phone. Patient status is: Remains hospitalized because of severity of illness Level of care: Med-Surg   Dispo: The patient is from: at daughter's home            Anticipated disposition:  SNF >2 days once leg swelling and redness improves.  Objective: Vitals last 24 hrs: Vitals:   12/09/23 0717 12/09/23 1436 12/09/23 2122 12/10/23 0518  BP:  126/65 (!) 123/59 (!) 140/69  Pulse:  79 73 76  Resp:  15 16 18   Temp:  98 F (36.7 C) 98.1 F (36.7 C) 98.1 F (36.7 C)  TempSrc:   Oral   SpO2:  95% 97% 98%  Weight: (!) 151.7 kg     Height:       Weight change:   Physical  Examination: General exam: alert awake, oriented  HEENT:Oral mucosa moist, Ear/Nose WNL grossly Respiratory system: Bilaterally clear BS,no use of accessory muscle Cardiovascular system: S1 & S2 +, No JVD. Gastrointestinal system: Abdomen soft,NT,ND, BS+ Nervous System: Alert, awake, moving all extremities,and following commands. Extremities: LE stil with pitting edema,+ w/ erythema and tenderness,distal peripheral pulses palpable and warm.  Skin: No rashes,no icterus. MSK: Normal muscle bulk,tone, power .  Medications reviewed:  Scheduled Meds:  furosemide  40 mg Intravenous BID   gabapentin  300 mg Oral TID   hydroxychloroquine  200 mg Oral BID   Continuous Infusions:   ceFAZolin (ANCEF) IV 2 g (12/10/23 1610)      Diet Order             Diet regular Room service appropriate? Yes; Fluid consistency: Thin  Diet effective now                  Intake/Output Summary (Last 24 hours) at 12/10/2023 0838 Last data filed at 12/10/2023 0540 Gross per 24 hour  Intake 940 ml  Output 2050 ml  Net -1110 ml   Net IO Since Admission: -1,162.43 mL [12/10/23 0838]  Wt Readings from Last 3 Encounters:  12/09/23 (!) 151.7 kg  11/30/23 132.5 kg  06/29/23 132.5 kg     Unresulted Labs (From admission, onward)     Start     Ordered   12/07/23 0500  Basic metabolic panel  Daily,   R      12/06/23 0733          Data Reviewed: I have personally reviewed following labs and imaging studies CBC: Recent Labs  Lab 12/05/23 1911 12/06/23 0551 12/07/23 0446 12/08/23 0453 12/09/23 0443  WBC 9.3 8.1 7.5 7.6 6.9  NEUTROABS 5.1 4.1  --   --   --   HGB 11.9* 10.9* 11.1* 10.8* 10.6*  HCT 36.2 32.0* 34.8* 33.2* 32.8*  MCV 97.6 99.1 100.6* 99.4 99.7  PLT 358 291 300 292 285   Basic Metabolic Panel:  Recent Labs  Lab 12/06/23 0551 12/07/23 0446 12/08/23 0453 12/09/23 0443 12/10/23 0509  NA 138 137 138 135 139  K 3.0* 3.6 3.6 4.1 3.8  CL 109 106 105 100 102  CO2 24 26 25 26 28    GLUCOSE 98 90 93 95 96  BUN 12 12 13 15 16   CREATININE 0.51 0.73 0.67 0.76 0.69  CALCIUM 8.4* 8.5* 8.7* 8.4* 8.8*  MG 2.0  --   --   --   --    GFR: Estimated Creatinine Clearance: 122.4 mL/min (by C-G formula based on SCr of 0.69 mg/dL). Liver Function Tests:  Recent Labs  Lab 12/05/23 1911 12/06/23 0551  AST 15 13*  ALT 14 11  ALKPHOS 70 62  BILITOT 0.2 0.5  PROT 7.4 6.6  ALBUMIN 3.5 3.2*  No results for input(s): "LIPASE", "AMYLASE" in the last 168 hours. No results for input(s): "AMMONIA" in the last 168 hours. Coagulation Profile:  Recent Labs  Lab 12/05/23 1911  INR 0.9  Sepsis Labs: Recent Labs  Lab 12/05/23 1911  LATICACIDVEN 0.9   Recent Results (from the past 240 hours)  Blood culture (  routine x 2)     Status: None   Collection Time: 12/05/23  7:11 PM   Specimen: BLOOD  Result Value Ref Range Status   Specimen Description   Final    BLOOD RIGHT ANTECUBITAL Performed at [facility], [address] [place], [place] [address]    Special Requests   Final    BOTTLES DRAWN AEROBIC AND ANAEROBIC Blood Culture adequate volume Performed at [facility], [address] [place], [place] [address]    Culture   Final    NO GROWTH 5 DAYS Performed at [facility], [address], [place], [place] [address]    Report Status 12/10/2023 FINAL  Final  Blood culture (routine x 2)     Status: None (Preliminary result)   Collection Time: 12/06/23  6:01 AM   Specimen: BLOOD  Result Value Ref Range Status   Specimen Description   Final    BLOOD SITE NOT SPECIFIED Performed at [facility], [address] [place], [place] [address]    Special Requests   Final    BOTTLES DRAWN AEROBIC AND ANAEROBIC Blood Culture results may not be optimal due to an inadequate volume of blood received in culture bottles Performed at [facility], [address], [place], [place] [address]    Culture   Final    NO  GROWTH 4 DAYS Performed at [facility], [address] [place], [place] [address]    Report Status PENDING  Incomplete  MRSA Next Gen by PCR, Nasal     Status: None   Collection Time: 12/08/23 11:52 AM   Specimen: Nasal Mucosa; Nasal Swab  Result Value Ref Range Status   MRSA by PCR Next Gen NOT DETECTED NOT DETECTED Final    Comment: (NOTE) The GeneXpert MRSA Assay (FDA approved for NASAL specimens only), is one component of a comprehensive MRSA colonization surveillance program. It is not intended to diagnose MRSA infection nor to guide or monitor treatment for MRSA infections. Test performance is not FDA approved in patients less than 54 years old. Performed at [facility], [address] [place], [place] [address]     Antimicrobials/Microbiology: Anti-infectives (From admission, onward)    Start     Dose/Rate Route Frequency Ordered Stop   12/09/23 0600  ceFAZolin (ANCEF) IVPB 2g/100 mL premix        2 g 200 mL/hr over 30 Minutes Intravenous Every 8 hours 12/08/23 1410     12/06/23 2000  vancomycin (VANCOCIN) 2,250 mg in sodium chloride 0.9 % 500 mL IVPB  Status:  Discontinued        2,250 mg 261.3 mL/hr over 120 Minutes Intravenous Every 24 hours 12/06/23 0557 12/06/23 1123   12/06/23 1123  vancomycin (VANCOCIN) IVPB 1000 mg/200 mL premix  Status:  Discontinued        1,000 mg 200 mL/hr over 60 Minutes Intravenous Every 12 hours 12/06/23 1123 12/08/23 1410   12/06/23 1000  hydroxychloroquine (PLAQUENIL) tablet 200 mg        200 mg Oral 2 times daily 12/06/23 0548     12/06/23 0630  cefTRIAXone (ROCEPHIN) 1 g in sodium chloride 0.9 % 100 mL IVPB  Status:  Discontinued        1 g 200 mL/hr over 30 Minutes Intravenous Every 24 hours 12/06/23 0543 12/08/23 1410   12/05/23 2215  vancomycin (VANCOCIN) IVPB 1000 mg/200 mL premix  Placed in "Followed by" Linked Group   1,000 mg 200 mL/hr over 60 Minutes Intravenous  Once 12/05/23 2045 12/06/23  0012   12/05/23 2045  vancomycin (VANCOCIN) IVPB 1000 mg/200 mL premix       Placed in "Followed by" Linked Group   1,000 mg 200 mL/hr over 60 Minutes Intravenous  Once 12/05/23 2045 12/05/23 2230         Component Value Date/Time   SDES  12/06/2023 0601    BLOOD SITE NOT SPECIFIED Performed at [facility], [address] [place], [place] [address]    SPECREQUEST  12/06/2023 0601    BOTTLES DRAWN AEROBIC AND ANAEROBIC Blood Culture results may not be optimal due to an inadequate volume of blood received in culture bottles Performed at [facility], [address] [place], [place] [address]    CULT  12/06/2023 0601    NO GROWTH 4 DAYS Performed at [facility], [address], [place], [place] [address]    REPTSTATUS PENDING 12/06/2023 0601     Radiology Studies: DG Knee 1-2 Views Left Result Date: 12/08/2023 CLINICAL DATA:  Prior knee surgery in 2011.  Knee pain. EXAM: LEFT KNEE - 1-2 VIEW COMPARISON:  Left tibia and fibula radiographs 12/05/2023 FINDINGS: Status post total left knee arthroplasty. No perihardware lucency is seen to indicate hardware failure or loosening. No knee joint effusion. Mild chronic enthesopathic change at the quadriceps insertion on the patella. No acute fracture dislocation. Mild diffuse subcutaneous fat edema and swelling, similar to prior. No subcutaneous air. IMPRESSION: Status post total left knee arthroplasty without evidence of hardware failure. Electronically Signed   By: Neita Garnet M.D.   On: 12/08/2023 14:15     LOS: 4 days   Total time spent in review of labs and imaging, patient evaluation, formulation of plan, documentation and communication with family: 35 minutes  Lanae Boast, MD  [facility]  12/10/2023, 8:38 AM

## 2023-12-11 DIAGNOSIS — L03116 Cellulitis of left lower limb: Secondary | ICD-10-CM | POA: Diagnosis not present

## 2023-12-11 LAB — CULTURE, BLOOD (ROUTINE X 2): Culture: NO GROWTH

## 2023-12-11 LAB — BASIC METABOLIC PANEL
Anion gap: 7 (ref 5–15)
BUN: 19 mg/dL (ref 6–20)
CO2: 31 mmol/L (ref 22–32)
Calcium: 8.7 mg/dL — ABNORMAL LOW (ref 8.9–10.3)
Chloride: 99 mmol/L (ref 98–111)
Creatinine, Ser: 0.72 mg/dL (ref 0.44–1.00)
GFR, Estimated: >60 mL/min (ref >60)
Glucose, Bld: 95 mg/dL (ref 70–99)
Potassium: 4.1 mmol/L (ref 3.5–5.1)
Sodium: 137 mmol/L (ref 135–145)

## 2023-12-11 MED ORDER — AMOXICILLIN-POT CLAVULANATE 875-125 MG PO TABS
1.0000 | ORAL_TABLET | Freq: Two times a day (BID) | ORAL | Status: AC
  Administered 2023-12-11 – 2023-12-13 (×6): 1 via ORAL
  Filled 2023-12-11 (×6): qty 1

## 2023-12-11 MED ORDER — ENOXAPARIN SODIUM 80 MG/0.8ML IJ SOSY
70.0000 mg | PREFILLED_SYRINGE | Freq: Every day | INTRAMUSCULAR | Status: DC
  Administered 2023-12-11 – 2023-12-16 (×6): 70 mg via SUBCUTANEOUS
  Filled 2023-12-11 (×6): qty 0.8

## 2023-12-11 MED ORDER — FUROSEMIDE 10 MG/ML IJ SOLN
40.0000 mg | Freq: Two times a day (BID) | INTRAMUSCULAR | Status: DC
Start: 2023-12-11 — End: 2023-12-12
  Administered 2023-12-12: 40 mg via INTRAVENOUS
  Filled 2023-12-11 (×2): qty 4

## 2023-12-11 NOTE — Plan of Care (Signed)
  Problem: Activity: Goal: Risk for activity intolerance will decrease Outcome: Progressing   

## 2023-12-11 NOTE — Progress Notes (Signed)
Requested to have sleeping med Ambien renewed for pt, instructed to give pt her Atarax at bedtime.

## 2023-12-11 NOTE — Progress Notes (Signed)
Mobility Specialist - Progress Note   12/11/23 1437  Mobility  Activity Stood at bedside;Transferred to/from [facility]  Level of Assistance Modified independent, requires aide device or extra time  Assistive Device Front wheel walker  Activity Response Tolerated well  Mobility Referral Yes  Mobility visit 1 Mobility   Pt received in bed requesting assistance to [facility]. No complaints during session. Pt to bed after session with all needs met.   [facility]

## 2023-12-11 NOTE — Progress Notes (Signed)
Pt upset this afternoon, refuses to allow this nurse to replace her foam dressings on her L lateral shin and sacrum. Pt states that she will have the night shift nurse do this instead. Pt also complaining that her NT put her purewick in wrong and that now she is all wet. Pt also refusing to allow this nurse to give her 1800 dose of IV Lasix but will not give a reason why. Pt yelling at times, verbally aggressive and says that she will call her lawyer. Pt also says that her daughter is very unhappy w her care here and when she gets off of work she will "take care of it." Discussed w pt mobility issues and pt screamed at me "It's none of your business, get out of my room!" Discussed various issues w MD and CN throughout the day. Allowed pt to vent concerns and support offered.

## 2023-12-11 NOTE — Progress Notes (Signed)
Mobility Specialist - Progress Note   12/11/23 1223  Mobility  Activity Transferred to/from [facility]  Level of Assistance Standby assist, set-up cues, supervision of patient - no hands on  Assistive Device Front wheel walker  Activity Response Tolerated well  Mobility Referral Yes  Mobility visit 1 Mobility  Mobility Specialist Start Time (ACUTE ONLY) 1116  Mobility Specialist Stop Time (ACUTE ONLY) 1132  Mobility Specialist Time Calculation (min) (ACUTE ONLY) 16 min   Pt received on [facility] requesting assistance back to bed. No complaints during session. Pt to bed after session with all needs met.    [facility]

## 2023-12-11 NOTE — Progress Notes (Addendum)
PROGRESS NOTE    Sierra Atkins  MWN:[id number] DOB: [birth year] DOA: 12/05/2023 PCP: Default, Provider, MD   Brief Narrative:  58 y.o. female with medical history significant for essential hypertension on Exforge, Coreg, Lasix, HCTZ, peripheral polyneuropathy, lupus presenting with 10 days of progressive LUL EEL edema after left knee, swelling warmth.  She was diagnosed with left lower extremity cellulitis completed 5 days of oral doxycycline from 11/30/2023 but without improvement.  However onset of symptoms was 3 weeks ago with worsening edema in the bilateral extremities had venous ultrasound on 12/13 negative for DVT. Seen at the med [facility], in the ED vitals fairly stable not hypoxic Labs with hypokalemia normal lactate WBC count.  Blood culture sent, imaging with chest x-ray-mild interstitial prominence -smoking-related versus edema.  Left leg x-ray with infiltrative subcutaneous edema throughout calf, total knee prosthesis.  Patient was given IV Lasix, pain medication Toradol vancomycin and admitted.   Assessment & Plan:   Moderate severe bilateral lower extremity cellulitis  suspect acute on chronic diastolic CHF: -Failed outpatient oral doxycycline.Duplex negative for DVT 11/22/23. BNP normal-could be false due to obesity -Echo  done 1/13 showed EF 60 to 65%, diastolic parameters indeterminate, right ventricular size is not well-visualized, poor acoustic windows- likely has chronic CHF (d) -at home on PO Laix 40mg  bid, was admitted in [facility] in sept 24 with similar presentations.  Lasix increased to 60 IV twice daily- LEswelling have improved I will decrease the dose to 40 IV twice daily -Will continue current antibiotics-MRSA PCR negative given non purulence> Vanco/ceftriaxone switched to Ancef 1/15 to avoid renal toxicity-switch to Augmentin on 1/18 -Continue to monitor renal function.  Keep low-salt and fluid restricted diet  SLE: -Stable cont home Plaquenil and  methotrexate.  Will hold on methotrexate due to cellulitis.   Hypokalemia: Resolved.  Continue to monitor   HTN: BP is well-controlled. Cont iv lasix as above   Peripheral neuropathy Chronic bilateral hip pain Chronic opiate use: -Continue gabapentin, pain meds and minimize narcotic use. States she is on Oxy 10 mg prn q6hr at home and takes norco for breakthrough pain as well.  Continue narcotics with caution   Morbid obesity Body mass index is 52.38 kg/m. : -Will benefit with PCP follow-up, weight loss  healthy lifestyle and outpatient sleep evaluation.   Deconditioning/debility: -PT OT following plan for skilled nursing facility once stable.She lives outside of town has moved to daughter home recently  DVT prophylaxis: Start Lovenox today Code Status: Full code Family Communication:  None present at bedside.  Plan of care discussed with patient in length and she verbalized understanding and agreed with it. Disposition Plan: SNF  Consultants:  None  Procedures:  None  Antimicrobials:  Ancef-switch to Augmentin on 1/18  Status is: Inpatient     Subjective: Patient seen and examined.  Reports pain, swelling, redness and swelling in bilateral lower extremities have improved significantly.  Remained afebrile.  No acute events overnight.  Objective: Vitals:   12/10/23 1700 12/10/23 2239 12/11/23 0524 12/11/23 0700  BP:  (!) 125/43 126/73   Pulse:  79 84   Resp:  15 20   Temp:  98.6 F (37 C) 98.6 F (37 C)   TempSrc:  Oral Oral   SpO2:  94% 94%   Weight: (!) 141.8 kg     Height:    5\' 4"  (1.626 m)    Intake/Output Summary (Last 24 hours) at 12/11/2023 1016 Last data filed at 12/11/2023 0600 Gross per 24 hour  Intake 2160 ml  Output 4500 ml  Net -2340 ml   Filed Weights   12/07/23 0500 12/09/23 0717 12/10/23 1700  Weight: (!) 153.2 kg (!) 151.7 kg (!) 141.8 kg    Examination:  General exam: Appears calm and comfortable, on room air, communicating  well Respiratory system: Clear to auscultation. Respiratory effort normal. Cardiovascular system: S1 & S2 heard, RRR. No JVD, murmurs, rubs, gallops or clicks. No pedal edema. Gastrointestinal system: Abdomen is nondistended, soft and nontender. No organomegaly or masses felt. Normal bowel sounds heard. Central nervous system: Alert and oriented. No focal neurological deficits. Extremities: Bilateral 1+ pitting edema positive with mild erythema and some tenderness.  Palpable peripheral pulses. Skin: No rashes, lesions or ulcers Psychiatry: Judgement and insight appear normal. Mood & affect appropriate.    Data Reviewed: I have personally reviewed following labs and imaging studies  CBC: Recent Labs  Lab 12/05/23 1911 12/06/23 0551 12/07/23 0446 12/08/23 0453 12/09/23 0443  WBC 9.3 8.1 7.5 7.6 6.9  NEUTROABS 5.1 4.1  --   --   --   HGB 11.9* 10.9* 11.1* 10.8* 10.6*  HCT 36.2 32.0* 34.8* 33.2* 32.8*  MCV 97.6 99.1 100.6* 99.4 99.7  PLT 358 291 300 292 285   Basic Metabolic Panel: Recent Labs  Lab 12/06/23 0551 12/07/23 0446 12/08/23 0453 12/09/23 0443 12/10/23 0509 12/11/23 0542  NA 138 137 138 135 139 137  K 3.0* 3.6 3.6 4.1 3.8 4.1  CL 109 106 105 100 102 99  CO2 24 26 25 26 28 31   GLUCOSE 98 90 93 95 96 95  BUN 12 12 13 15 16 19   CREATININE 0.51 0.73 0.67 0.76 0.69 0.72  CALCIUM 8.4* 8.5* 8.7* 8.4* 8.8* 8.7*  MG 2.0  --   --   --   --   --    GFR: Estimated Creatinine Clearance: 112.3 mL/min (by C-G formula based on SCr of 0.72 mg/dL). Liver Function Tests: Recent Labs  Lab 12/05/23 1911 12/06/23 0551  AST 15 13*  ALT 14 11  ALKPHOS 70 62  BILITOT 0.2 0.5  PROT 7.4 6.6  ALBUMIN 3.5 3.2*   No results for input(s): "LIPASE", "AMYLASE" in the last 168 hours. No results for input(s): "AMMONIA" in the last 168 hours. Coagulation Profile: Recent Labs  Lab 12/05/23 1911  INR 0.9   Cardiac Enzymes: No results for input(s): "CKTOTAL", "CKMB", "CKMBINDEX",  "TROPONINI" in the last 168 hours. BNP (last 3 results) No results for input(s): "PROBNP" in the last 8760 hours. HbA1C: No results for input(s): "HGBA1C" in the last 72 hours. CBG: No results for input(s): "GLUCAP" in the last 168 hours. Lipid Profile: No results for input(s): "CHOL", "HDL", "LDLCALC", "TRIG", "CHOLHDL", "LDLDIRECT" in the last 72 hours. Thyroid Function Tests: No results for input(s): "TSH", "T4TOTAL", "FREET4", "T3FREE", "THYROIDAB" in the last 72 hours. Anemia Panel: No results for input(s): "VITAMINB12", "FOLATE", "FERRITIN", "TIBC", "IRON", "RETICCTPCT" in the last 72 hours. Sepsis Labs: Recent Labs  Lab 12/05/23 1911  LATICACIDVEN 0.9    Recent Results (from the past 240 hours)  Blood culture (routine x 2)     Status: None   Collection Time: 12/05/23  7:11 PM   Specimen: BLOOD  Result Value Ref Range Status   Specimen Description   Final    BLOOD RIGHT ANTECUBITAL Performed at [facility], [address], [place], [place] [address]    Special Requests   Final    BOTTLES DRAWN AEROBIC AND  ANAEROBIC Blood Culture adequate volume Performed at [facility], [address] [place], [place] [address]    Culture   Final    NO GROWTH 5 DAYS Performed at [facility], [address], [place], [place] [address]    Report Status 12/10/2023 FINAL  Final  Blood culture (routine x 2)     Status: None   Collection Time: 12/06/23  6:01 AM   Specimen: BLOOD  Result Value Ref Range Status   Specimen Description   Final    BLOOD SITE NOT SPECIFIED Performed at [facility], [address] [place], [place] [address]    Special Requests   Final    BOTTLES DRAWN AEROBIC AND ANAEROBIC Blood Culture results may not be optimal due to an inadequate volume of blood received in culture bottles Performed at [facility], [address] [place], [place] [address]    Culture   Final    NO GROWTH 5  DAYS Performed at [facility], [address], [place], [place] [address]    Report Status 12/11/2023 FINAL  Final  MRSA Next Gen by PCR, Nasal     Status: None   Collection Time: 12/08/23 11:52 AM   Specimen: Nasal Mucosa; Nasal Swab  Result Value Ref Range Status   MRSA by PCR Next Gen NOT DETECTED NOT DETECTED Final    Comment: (NOTE) The GeneXpert MRSA Assay (FDA approved for NASAL specimens only), is one component of a comprehensive MRSA colonization surveillance program. It is not intended to diagnose MRSA infection nor to guide or monitor treatment for MRSA infections. Test performance is not FDA approved in patients less than 55 years old. Performed at [facility], [address], [place], [place] [address]       Radiology Studies: No results found.  Scheduled Meds:  furosemide  60 mg Intravenous BID   gabapentin  300 mg Oral TID   hydroxychloroquine  200 mg Oral BID   Continuous Infusions:   ceFAZolin (ANCEF) IV 2 g (12/11/23 0440)     LOS: 5 days   Time spent: 35 minutes   Lakya Schrupp Estill Cotta, MD [facility]  If 7PM-7AM, please contact night-coverage [email or web address] 12/11/2023, 10:16 AM

## 2023-12-12 DIAGNOSIS — L03116 Cellulitis of left lower limb: Secondary | ICD-10-CM | POA: Diagnosis not present

## 2023-12-12 LAB — BASIC METABOLIC PANEL
Anion gap: 10 (ref 5–15)
BUN: 21 mg/dL — ABNORMAL HIGH (ref 6–20)
CO2: 25 mmol/L (ref 22–32)
Calcium: 8.6 mg/dL — ABNORMAL LOW (ref 8.9–10.3)
Chloride: 99 mmol/L (ref 98–111)
Creatinine, Ser: 0.54 mg/dL (ref 0.44–1.00)
GFR, Estimated: >60 mL/min (ref >60)
Glucose, Bld: 101 mg/dL — ABNORMAL HIGH (ref 70–99)
Potassium: 4.3 mmol/L (ref 3.5–5.1)
Sodium: 134 mmol/L — ABNORMAL LOW (ref 135–145)

## 2023-12-12 LAB — MAGNESIUM: Magnesium: 2.4 mg/dL (ref 1.7–2.4)

## 2023-12-12 MED ORDER — FUROSEMIDE 10 MG/ML IJ SOLN
40.0000 mg | Freq: Two times a day (BID) | INTRAMUSCULAR | Status: DC
Start: 2023-12-12 — End: 2023-12-13
  Administered 2023-12-12 – 2023-12-13 (×2): 40 mg via INTRAVENOUS
  Filled 2023-12-12 (×2): qty 4

## 2023-12-12 MED ORDER — ZOLPIDEM TARTRATE 5 MG PO TABS
10.0000 mg | ORAL_TABLET | Freq: Every evening | ORAL | Status: DC | PRN
  Administered 2023-12-12 – 2023-12-15 (×4): 10 mg via ORAL
  Filled 2023-12-12 (×4): qty 2

## 2023-12-12 MED ORDER — HYDROXYCHLOROQUINE SULFATE 200 MG PO TABS
200.0000 mg | ORAL_TABLET | Freq: Every day | ORAL | Status: DC
  Administered 2023-12-12 – 2023-12-16 (×5): 200 mg via ORAL
  Filled 2023-12-12 (×5): qty 1

## 2023-12-12 NOTE — Progress Notes (Signed)
Mobility Specialist - Progress Note   12/12/23 1327  Mobility  Activity Ambulated with assistance in hallway  Level of Assistance Standby assist, set-up cues, supervision of patient - no hands on  Assistive Device Front wheel walker  Distance Ambulated (ft) 160 ft  Activity Response Tolerated well  Mobility Referral Yes  Mobility visit 1 Mobility  Mobility Specialist Start Time (ACUTE ONLY) 1256  Mobility Specialist Stop Time (ACUTE ONLY) 1326  Mobility Specialist Time Calculation (min) (ACUTE ONLY) 30 min   Pt received in bed and agreeable to mobility. Pt took x1 seated rest break d/t fatigue. No complaints during session. Pt to [facility] awaiting wash up after session with all needs met.    [facility]

## 2023-12-12 NOTE — Progress Notes (Signed)
PROGRESS NOTE  Sierra Atkins  UEA:[id number] DOB: [birth year] DOA: 12/05/2023 PCP: Default, Provider, MD  Brief Narrative:  58 y.o. female with medical history significant for essential hypertension, peripheral polyneuropathy, lupus presenting with 10 days of progressive LUL EEL edema after left knee, swelling warmth.  She was diagnosed with left lower extremity cellulitis completed 5 days of oral doxycycline from 11/30/2023 but without improvement.  However onset of symptoms was 3 weeks ago with worsening edema in the bilateral extremities had venous ultrasound on 12/13 negative for DVT. Left leg x-ray with infiltrative subcutaneous edema throughout calf, total knee prosthesis.  Assessment & Plan:   Moderate severe bilateral lower extremity cellulitis  -Failed outpatient oral doxycycline.Duplex negative for DVT 11/22/23. BNP normal-could be false due to obesity -Will continue current antibiotics-MRSA PCR negative given non purulence> Vanco/ceftriaxone switched to Ancef 1/15 to avoid renal toxicity-switch to Augmentin on 1/18 -Continue to monitor renal function.  Keep low-salt and fluid restricted diet - PT/OT - TOC. Currently awaiting SNF placement  SLE: -Stable cont home Plaquenil and methotrexate.    Hypokalemia: Resolved.  Continue to monitor   HTN: BP is well-controlled. Cont iv lasix   Peripheral neuropathy Chronic bilateral hip pain Chronic opiate use: -Continue gabapentin, pain meds and minimize narcotic use. States she is on Oxy 10 mg prn q6hr at home and takes norco for breakthrough pain as well.  Continue narcotics with caution   Morbid obesity Body mass index is 52.38 kg/m. : -Will benefit with PCP follow-up, weight loss  healthy lifestyle and outpatient sleep evaluation.   Deconditioning/debility: -PT OT following plan for skilled nursing facility once stable.She lives outside of town has moved to daughter home recently  DVT prophylaxis: Lovenox  Code Status: Full  code Family Communication:  None present at bedside.  Plan of care discussed with patient in length and she verbalized understanding and agreed with it. Disposition Plan: SNF  Consultants:  None  Procedures:  None  Antimicrobials:  Ancef-switch to Augmentin on 1/18  Status is: Inpatient   Subjective: Patient reports feeling much better today. Denies LE pain currently. Complaints about nursing staff yesterday.   Objective: Vitals:   12/11/23 1332 12/11/23 2047 12/11/23 2100 12/12/23 0613  BP: (!) 128/98 119/85  118/63  Pulse: 86 86  77  Resp: 20 18  16   Temp: 98.1 F (36.7 C)  98.2 F (36.8 C) 98.7 F (37.1 C)  TempSrc: Oral  Oral Oral  SpO2: 98% 100%  97%  Weight:      Height:        Intake/Output Summary (Last 24 hours) at 12/12/2023 0719 Last data filed at 12/12/2023 0600 Gross per 24 hour  Intake 1760 ml  Output 1350 ml  Net 410 ml   Filed Weights   12/07/23 0500 12/09/23 0717 12/10/23 1700  Weight: (!) 153.2 kg (!) 151.7 kg (!) 141.8 kg    Examination:  General exam: Appears calm and comfortable, on room air, communicating well Respiratory system: Clear to auscultation. Respiratory effort normal. Cardiovascular system: S1 & S2 heard, RRR. No JVD, murmurs, rubs, gallops or clicks. No pedal edema. Gastrointestinal system: Abdomen is nondistended, soft and nontender. No organomegaly or masses felt. Normal bowel sounds heard. Central nervous system: Alert and oriented. No focal neurological deficits. Extremities: Bilateral non pitting edema positive. Negative erythema and some tenderness.  Palpable peripheral pulses. Skin: No rashes, lesions or ulcers Psychiatry: Judgement and insight appear normal. Mood & affect appropriate.    Data Reviewed: I have personally reviewed  following labs and imaging studies  CBC: Recent Labs  Lab 12/05/23 1911 12/06/23 0551 12/07/23 0446 12/08/23 0453 12/09/23 0443  WBC 9.3 8.1 7.5 7.6 6.9  NEUTROABS 5.1 4.1  --   --    --   HGB 11.9* 10.9* 11.1* 10.8* 10.6*  HCT 36.2 32.0* 34.8* 33.2* 32.8*  MCV 97.6 99.1 100.6* 99.4 99.7  PLT 358 291 300 292 285   Basic Metabolic Panel: Recent Labs  Lab 12/06/23 0551 12/07/23 0446 12/08/23 0453 12/09/23 0443 12/10/23 0509 12/11/23 0542 12/12/23 0506  NA 138   < > 138 135 139 137 134*  K 3.0*   < > 3.6 4.1 3.8 4.1 4.3  CL 109   < > 105 100 102 99 99  CO2 24   < > 25 26 28 31 25   GLUCOSE 98   < > 93 95 96 95 101*  BUN 12   < > 13 15 16 19  21*  CREATININE 0.51   < > 0.67 0.76 0.69 0.72 0.54  CALCIUM 8.4*   < > 8.7* 8.4* 8.8* 8.7* 8.6*  MG 2.0  --   --   --   --   --  2.4   < > = values in this interval not displayed.    LOS: 6 days   Time spent: 35 minutes   Leeroy Bock, MD [facility]  If 7PM-7AM, please contact night-coverage [email or web address] 12/12/2023, 7:19 AM

## 2023-12-13 DIAGNOSIS — L03116 Cellulitis of left lower limb: Secondary | ICD-10-CM | POA: Diagnosis not present

## 2023-12-13 MED ORDER — FUROSEMIDE 40 MG PO TABS
40.0000 mg | ORAL_TABLET | Freq: Two times a day (BID) | ORAL | Status: DC
  Administered 2023-12-13 – 2023-12-16 (×7): 40 mg via ORAL
  Filled 2023-12-13 (×7): qty 1

## 2023-12-13 NOTE — TOC Progression Note (Signed)
Transition of Care [facility]) - Progression Note    Patient Details  Name: Sierra Atkins MRN: [id number] Date of Birth: [birth year]  Transition of Care [facility]) CM/SW Contact  Howell Rucks, RN Phone Number: 12/13/2023, 11:40 AM  Clinical Narrative:   No current bed offers for short term rehab/SNF.  Per 1/20 PT  Note: Pt does not feel she can mange at home, her daughter is not available during the day as she works. Pt hoping to DC to short term rehab. Case discussed with [facility] Supervisor, patient does not meet criteria for short term rehab, TOC can arrange HH PT/OT/HHA at discharge, team notified.      Expected Discharge Plan: Home/Self Care Barriers to Discharge: Continued Medical Work up  Expected Discharge Plan and Services       Living arrangements for the past 2 months: Single Family Home                                       Social Determinants of Health (SDOH) Interventions SDOH Screenings   Food Insecurity: No Food Insecurity (12/06/2023)  Housing: Unknown (12/06/2023)  Transportation Needs: No Transportation Needs (12/06/2023)  Utilities: Not At Risk (12/06/2023)  Social Connections: Unknown (12/06/2023)  Stress: No Stress Concern Present (08/13/2020)   Received from [facility], [facility]  Tobacco Use: High Risk (12/06/2023)    Readmission Risk Interventions    12/06/2023   11:51 AM  Readmission Risk Prevention Plan  Transportation Screening Complete  PCP or Specialist Appt within 5-7 Days Complete  Home Care Screening Complete  Medication Review (RN CM) Complete

## 2023-12-13 NOTE — Progress Notes (Signed)
PROGRESS NOTE  Sierra Atkins  ZOX:[id number] DOB: Sep 30, 1966 DOA: 12/05/2023 PCP: Default, Provider, MD  Brief Narrative:  58 y.o. female with medical history significant for HTN, peripheral polyneuropathy, lupus. They presented 1/12 from home with 10 days of progressive LE edema and erythema.  She was diagnosed outpatient with left lower extremity cellulitis completed 5 days of oral doxycycline from 11/30/2023 but without improvement.  Today: doing well. Ambulating and tolerating normal diet. Currently awaiting SNF placement. Antibiotics completed 1/20.  Assessment & Plan:   Moderate severe bilateral lower extremity cellulitis- in relation to chronic venous stasis and polyneuropathy -Failed outpatient oral doxycycline. -Will continue current antibiotics-MRSA PCR negative given non purulence> Vanco/ceftriaxone switched to Ancef 1/15 to avoid renal toxicity-switch to Augmentin on 1/18 to be completed today.  - PT/OT - TOC. Currently awaiting SNF placement  SLE: -Stable cont home Plaquenil and methotrexate.    Hypokalemia: Resolved.  Continue to monitor   HTN: BP is well-controlled. Cont lasix. Converted to PO today   Peripheral neuropathy Chronic bilateral hip pain Chronic opiate use: -Continue gabapentin, pain meds and minimize narcotic use. States she is on Oxy 10 mg prn q6hr at home and takes norco for breakthrough pain as well.  Continue narcotics with caution   Morbid obesity Body mass index is 52.38 kg/m. : -Will benefit with PCP follow-up, weight loss  healthy lifestyle and outpatient sleep evaluation.   Deconditioning/debility: -PT OT following plan for skilled nursing facility once stable.She lives outside of town has moved to daughter home recently  DVT prophylaxis: Lovenox  Code Status: Full code Family Communication:  None present at bedside.  Plan of care discussed with patient in length and she verbalized understanding and agreed with it. Disposition Plan:  SNF  Consultants:  None  Procedures:  None  Antimicrobials:  Ancef-switch to Augmentin on 1/18  Status is: Inpatient   Subjective: Patient reports feeling much better today. Denies LE pain currently. She had some nausea and vomiting after lunch yesterday but it has improved and resolved this morning. Denies abdominal pain.   Objective: Vitals:   12/12/23 1334 12/12/23 2217 12/13/23 0500 12/13/23 0540  BP: (!) 117/59 (!) 126/92  (!) 145/79  Pulse: 86 84  79  Resp: 18 18  20   Temp: 98.4 F (36.9 C) 98.4 F (36.9 C)  98.7 F (37.1 C)  TempSrc: Oral Oral  Oral  SpO2: 100% 96%  95%  Weight:   (!) 150.2 kg   Height:        Intake/Output Summary (Last 24 hours) at 12/13/2023 0715 Last data filed at 12/13/2023 0535 Gross per 24 hour  Intake 1680 ml  Output 2200 ml  Net -520 ml   Filed Weights   12/09/23 0717 12/10/23 1700 12/13/23 0500  Weight: (!) 151.7 kg (!) 141.8 kg (!) 150.2 kg    Examination:  General exam: Appears calm and comfortable, on room air, communicating well Respiratory system: Clear to auscultation. Respiratory effort normal. Cardiovascular system: S1 & S2 heard, RRR. No JVD, murmurs, rubs, gallops or clicks. No pedal edema. Gastrointestinal system: Abdomen is nondistended, soft and nontender. No organomegaly or masses felt. Normal bowel sounds heard. Central nervous system: Alert and oriented. No focal neurological deficits. Extremities: Bilateral non pitting edema positive. Negative erythema and non tenderness.  Palpable peripheral pulses. Skin: No rashes, lesions or ulcers Psychiatry: Judgement and insight appear normal. Mood & affect appropriate.    Data Reviewed: I have personally reviewed following labs and imaging studies  CBC: Recent Labs  Lab 12/07/23 0446 12/08/23 0453 12/09/23 0443  WBC 7.5 7.6 6.9  HGB 11.1* 10.8* 10.6*  HCT 34.8* 33.2* 32.8*  MCV 100.6* 99.4 99.7  PLT 300 292 285   Basic Metabolic Panel: Recent Labs  Lab  12/08/23 0453 12/09/23 0443 12/10/23 0509 12/11/23 0542 12/12/23 0506  NA 138 135 139 137 134*  K 3.6 4.1 3.8 4.1 4.3  CL 105 100 102 99 99  CO2 25 26 28 31 25   GLUCOSE 93 95 96 95 101*  BUN 13 15 16 19  21*  CREATININE 0.67 0.76 0.69 0.72 0.54  CALCIUM 8.7* 8.4* 8.8* 8.7* 8.6*  MG  --   --   --   --  2.4    LOS: 7 days   Time spent: 35 minutes   Leeroy Bock, MD [facility]  If 7PM-7AM, please contact night-coverage [email or web address] 12/13/2023, 7:15 AM

## 2023-12-13 NOTE — Plan of Care (Signed)

## 2023-12-13 NOTE — Plan of Care (Signed)
  Problem: Education: Goal: Knowledge of General Education information will improve Description Including pain rating scale, medication(s)/side effects and non-pharmacologic comfort measures Outcome: Progressing   Problem: Health Behavior/Discharge Planning: Goal: Ability to manage health-related needs will improve Outcome: Progressing   

## 2023-12-13 NOTE — Progress Notes (Signed)
Physical Therapy Treatment Patient Details Name: Sierra Atkins MRN: [id number] DOB: [birth year] Today's Date: 12/13/2023   History of Present Illness 58 y.o. female  brought to [facility] with LE cellulitis, edema ,recently treated with no improvement. PMH: essential hypertension ,peripheral polyneuropathy, Bil. Hip DJD, LTKA craniotomy for benign tumor. Legs  Negative for DVT.    PT Comments  Pt ambulated 60' x 2 (with seated rest break) with RW, no loss of balance. Pt is progressing well with mobility. Pt does not feel she can mange at home, her daughter is not available during the day as she works. Pt hoping to DC to short term rehab.     If plan is discharge home, recommend the following: A little help with walking and/or transfers;A little help with bathing/dressing/bathroom;Assist for transportation;Assistance with cooking/housework;Help with stairs or ramp for entrance   Can travel by private vehicle     Yes  Equipment Recommendations  None recommended by PT    Recommendations for Other Services       Precautions / Restrictions Precautions Precautions: Fall Restrictions Weight Bearing Restrictions Per Provider Order: No     Mobility  Bed Mobility Overal bed mobility: Needs Assistance Bed Mobility: Supine to Sit     Supine to sit: Min assist, HOB elevated, Used rails     General bed mobility comments: assist to raise trunk    Transfers Overall transfer level: Needs assistance Equipment used: Rolling walker (2 wheels) Transfers: Sit to/from Stand, Bed to chair/wheelchair/BSC Sit to Stand: Contact guard assist   Step pivot transfers: Contact guard assist       General transfer comment: VCs hand placement    Ambulation/Gait Ambulation/Gait assistance: Contact guard assist Gait Distance (Feet): 60 Feet Assistive device: Rolling walker (2 wheels) Gait Pattern/deviations: Step-to pattern, Wide base of support, Trunk flexed Gait velocity: decr     General Gait  Details: 60' x 2 with seated rest break, no loss of balance   Stairs             Wheelchair Mobility     Tilt Bed    Modified Rankin (Stroke Patients Only)       Balance Overall balance assessment: Needs assistance Sitting-balance support: Feet supported, No upper extremity supported Sitting balance-Leahy Scale: Good     Standing balance support: Bilateral upper extremity supported, Reliant on assistive device for balance, During functional activity Standing balance-Leahy Scale: Fair                              Cognition Arousal: Alert Behavior During Therapy: WFL for tasks assessed/performed Overall Cognitive Status: Within Functional Limits for tasks assessed                                 General Comments: pleasant, jovial, hyperverbose        Exercises      General Comments        Pertinent Vitals/Pain Pain Assessment Pain Score: 6  Pain Location: BLEs from hips to feet Pain Descriptors / Indicators: Sore Pain Intervention(s): Limited activity within patient's tolerance, Repositioned    Home Living                          Prior Function            PT Goals (current goals can now be found in the  care plan section) Acute Rehab PT Goals Patient Stated Goal: to get stronger at rehab, then  get hip replacements PT Goal Formulation: With patient Time For Goal Achievement: 12/20/23 Potential to Achieve Goals: Good Progress towards PT goals: Progressing toward goals    Frequency    Min 1X/week      PT Plan      Co-evaluation              AM-PAC PT "6 Clicks" Mobility   Outcome Measure  Help needed turning from your back to your side while in a flat bed without using bedrails?: A Little Help needed moving from lying on your back to sitting on the side of a flat bed without using bedrails?: A Little Help needed moving to and from a bed to a chair (including a wheelchair)?: A Little Help  needed standing up from a chair using your arms (e.g., wheelchair or bedside chair)?: A Little Help needed to walk in hospital room?: A Little Help needed climbing 3-5 steps with a railing? : A Lot 6 Click Score: 17    End of Session Equipment Utilized During Treatment: Gait belt Activity Tolerance: Patient tolerated treatment well Patient left: in chair;with call bell/phone within reach;with chair alarm set Nurse Communication: Mobility status PT Visit Diagnosis: Unsteadiness on feet (R26.81);Pain Pain - Right/Left: Right Pain - part of body: Knee;Hip     Time: 8295-6213 PT Time Calculation (min) (ACUTE ONLY): 16 min  Charges:    $Gait Training: 8-22 mins PT General Charges $$ ACUTE PT VISIT: 1 Visit                    Tamala Ser PT 12/13/2023  Acute Rehabilitation Services  Office [phone]

## 2023-12-14 DIAGNOSIS — L03116 Cellulitis of left lower limb: Secondary | ICD-10-CM | POA: Diagnosis not present

## 2023-12-14 MED ORDER — FLUCONAZOLE 100 MG PO TABS
100.0000 mg | ORAL_TABLET | Freq: Every day | ORAL | Status: AC
  Administered 2023-12-14 – 2023-12-16 (×3): 100 mg via ORAL
  Filled 2023-12-14 (×3): qty 1

## 2023-12-14 NOTE — TOC Progression Note (Signed)
Transition of Care [facility]) - Progression Note    Patient Details  Name: Sierra Atkins MRN: [id number] Date of Birth: 1966/04/28  Transition of Care [facility]) CM/SW Contact  Howell Rucks, RN Phone Number: 12/14/2023, 2:53 PM  Clinical Narrative:   Call received from pt's dtr, Sierra Atkins, discussed dc disposition options, pt's dtr reports pt has been living  with her for about 7 months, was only supposed to be a month, dtr/pt is in the process of cleaning/decluttering pt's home in [place], dtr would prefer pt not to come back to her place but the only option is to return to her [place] home with [facility] PT/OT/HHA with no other assistance. Dtr states patient may be able to return to her home but will need a hospital bed because pt has been sleeping on an air mattress or sleeping in recliner, dtr state she will think about dc options for patient and call NCM back later this afternoon. NCM received call from patient requesting a BSC and shower chair, request sent to attending to place order for hospital bed, BSC and shower chair.  [employer], rep-[employer]erm[employer], referral sent for DME, await confirmation for delivery depending on dtr's decision.   -1/21 3:00pm Call received from Sierra Atkins, pt's dtr, reports she spoke with pt, pt reports she developed a headache, pt's dtr to contact NCM in the morning with dc disposition. TOC will continue to follow.     Expected Discharge Plan: Home/Self Care Barriers to Discharge: Continued Medical Work up  Expected Discharge Plan and Services       Living arrangements for the past 2 months: Single Family Home                                       Social Determinants of Health (SDOH) Interventions SDOH Screenings   Food Insecurity: No Food Insecurity (12/06/2023)  Housing: Unknown (12/06/2023)  Transportation Needs: No Transportation Needs (12/06/2023)  Utilities: Not At Risk (12/06/2023)  Social Connections: Unknown (12/06/2023)  Stress: No Stress Concern  Present (08/13/2020)   Received from [facility], [facility]  Tobacco Use: High Risk (12/06/2023)    Readmission Risk Interventions    12/06/2023   11:51 AM  Readmission Risk Prevention Plan  Transportation Screening Complete  PCP or Specialist Appt within 5-7 Days Complete  Home Care Screening Complete  Medication Review (RN CM) Complete

## 2023-12-14 NOTE — Progress Notes (Signed)
PROGRESS NOTE Sierra Atkins  QMV:[id number] DOB: Jan 31, 1966 DOA: 12/05/2023 PCP: Default, Provider, MD  Brief Narrative/Hospital Course: 58 y.o. female with medical history significant for essential hypertension on Exforge, Coreg, Lasix, HCTZ, peripheral polyneuropathy, lupus presenting with 10 days of progressive LUL EEL edema after left knee, swelling warmth.  She was diagnosed with left lower extremity cellulitis completed 5 days of oral doxycycline from 11/30/2023 but without improvement.  However onset of symptoms was 3 weeks ago with worsening edema in the bilateral extremities had venous ultrasound on 12/13 negative for DVT. Seen at the med [facility], in the ED vitals fairly stable not hypoxic Labs with hypokalemia normal lactate WBC count.  Blood culture sent, imaging with chest x-ray-mild interstitial prominence -smoking-related versus edema.  Left leg x-ray with infiltrative subcutaneous edema throughout calf, total knee prosthesis.  Patient was given IV Lasix, pain medication Toradol vancomycin and admitted.  Patient is manage IV antibiotics MRSA was negative switch to Ancef.  Patient was  placed on IV Lasix.  Patient completed antibiotics, at this time waiting for placement.     Subjective: Seen this am No new complaints Overnight afebrile BP stable Weight a 337>334> 312(?)>331lb  Principal Problem:   Cellulitis of left lower extremity Active Problems:   Hypokalemia   Peripheral polyneuropathy   Essential hypertension   Lupus (systemic lupus erythematosus) (HCC)    Moderate severe bilateral lower extremity cellulitis Chronic venous stasis and polyneuropathy: Failed outpatient oral antibiotics and oral Lasix.  Managed with IV Lasix, echo with poor window.  BNP normal.  Initially on vancomycin and ceftriaxone switched to Ancef 1/15 as MRSA negative,and subsequently switched to Augmentin, IV Lasix switched to p.o.  Recent similar admission at [facility]  Duplex  prior to admission was negative for DVT.  Refusing low-sodium diet.  Encourage PT OT awaiting SNF. Cont po lasix.   SLE: Stable,cont home Plaquenil and methotrexate.    Hypokalemia: Resovled   HTN: Stable on lasix   Peripheral neuropathy Chronic bilateral hip pain Chronic opiate use: Continue home gabapentin, minimize narcotics.  Patient endorsed taking Oxy 10 mg prn q6hr at home and Norco for breakthrough pain.continue current regimen with caution    Morbid Obesity: Patient's Body mass index is 56.84 kg/m. : Will benefit with PCP follow-up, weight loss  healthy lifestyle and outpatient sleep evaluation.  DVT prophylaxis: SCDs Start: 12/06/23 0517 Code Status:   Code Status: Full Code Family Communication: plan of care discussed with patient, daughter not available on phone  Patient status is: Remains hospitalized because of severity of illness Level of care: Med-Surg   Dispo: The patient is from: Home at daughter's house            Anticipated disposition: Await skilled nursing facility > but no offers, plan is to discharge to daughter's home once equipment delivered.  Requesting hospital bed  Objective: Vitals last 24 hrs: Vitals:   12/13/23 1315 12/13/23 2108 12/14/23 0603 12/14/23 0622  BP: 128/74 111/65 (!) 147/102   Pulse: 79 78 77   Resp: 18 16 16    Temp: 98 F (36.7 C) 98.9 F (37.2 C) (!) 97.4 F (36.3 C) 98.2 F (36.8 C)  TempSrc: Oral Oral Oral Oral  SpO2: 99% 94% 93%   Weight:      Height:       Weight change:   Physical Examination: General exam: alert awake,at baseline, older than stated age HEENT:Oral mucosa moist, Ear/Nose WNL grossly Respiratory system: Bilaterally clear BS,no use of accessory  muscle Cardiovascular system: S1 & S2 +, No JVD. Gastrointestinal system: Abdomen soft,NT,ND, BS+ Nervous System: Alert, awake, moving all extremities,and following commands. Extremities: LE edema mild,distal peripheral pulses palpable and warm.  Skin: No  rashes,no icterus. MSK: Normal muscle bulk,tone, power   Medications reviewed:  Scheduled Meds:  enoxaparin (LOVENOX) injection  70 mg Subcutaneous Daily   furosemide  40 mg Oral BID   gabapentin  300 mg Oral TID   hydroxychloroquine  200 mg Oral Daily  Continuous Infusions:   Diet Order             Diet regular Room service appropriate? Yes; Fluid consistency: Thin  Diet effective now                  Intake/Output Summary (Last 24 hours) at 12/14/2023 1040 Last data filed at 12/14/2023 0935 Gross per 24 hour  Intake 2680 ml  Output 2200 ml  Net 480 ml   Net IO Since Admission: -3,722.43 mL [12/14/23 1040]  Wt Readings from Last 3 Encounters:  12/13/23 (!) 150.2 kg  11/30/23 132.5 kg  06/29/23 132.5 kg    Unresulted Labs (From admission, onward)    None     Data Reviewed: I have personally reviewed following labs and imaging studies CBC: Recent Labs  Lab 12/08/23 0453 12/09/23 0443  WBC 7.6 6.9  HGB 10.8* 10.6*  HCT 33.2* 32.8*  MCV 99.4 99.7  PLT 292 285   Basic Metabolic Panel:  Recent Labs  Lab 12/08/23 0453 12/09/23 0443 12/10/23 0509 12/11/23 0542 12/12/23 0506  NA 138 135 139 137 134*  K 3.6 4.1 3.8 4.1 4.3  CL 105 100 102 99 99  CO2 25 26 28 31 25   GLUCOSE 93 95 96 95 101*  BUN 13 15 16 19  21*  CREATININE 0.67 0.76 0.69 0.72 0.54  CALCIUM 8.7* 8.4* 8.8* 8.7* 8.6*  MG  --   --   --   --  2.4   GFR: Estimated Creatinine Clearance: 116.5 mL/min (by C-G formula based on SCr of 0.54 mg/dL). Liver Function Tests:  No results for input(s): "PROCALCITON", "LATICACIDVEN" in the last 168 hours. Recent Results (from the past 240 hours)  Blood culture (routine x 2)     Status: None   Collection Time: 12/05/23  7:11 PM   Specimen: BLOOD  Result Value Ref Range Status   Specimen Description   Final    BLOOD RIGHT ANTECUBITAL Performed at [facility], [address] [place], [place] [address]    Special Requests   Final    BOTTLES  DRAWN AEROBIC AND ANAEROBIC Blood Culture adequate volume Performed at [facility], [address] [place], [place] [address]    Culture   Final    NO GROWTH 5 DAYS Performed at [facility], [address], [place], [place] [address]    Report Status 12/10/2023 FINAL  Final  Blood culture (routine x 2)     Status: None   Collection Time: 12/06/23  6:01 AM   Specimen: BLOOD  Result Value Ref Range Status   Specimen Description   Final    BLOOD SITE NOT SPECIFIED Performed at [facility], [address], [place], [place] [address]    Special Requests   Final    BOTTLES DRAWN AEROBIC AND ANAEROBIC Blood Culture results may not be optimal due to an inadequate volume of blood received in culture bottles Performed at [facility], [address], [place], [place] [address]    Culture   Final    NO GROWTH 5 DAYS Performed at [facility], [address] [place], [place] [address]    Report Status 12/11/2023 FINAL  Final  MRSA Next Gen by PCR, Nasal     Status: None   Collection Time: 12/08/23 11:52 AM   Specimen: Nasal Mucosa; Nasal Swab  Result Value Ref Range Status   MRSA by PCR Next Gen NOT DETECTED NOT DETECTED Final    Comment: (NOTE) The GeneXpert MRSA Assay (FDA approved for NASAL specimens only), is one component of a comprehensive MRSA colonization surveillance program. It is not intended to diagnose MRSA infection nor to guide or monitor treatment for MRSA infections. Test performance is not FDA approved in patients less than 48 years old. Performed at [facility], [address], [place], [place] [address]   Antimicrobials/Microbiology: Anti-infectives (From admission, onward)    Start     Dose/Rate Route Frequency Ordered Stop   12/12/23 1000  hydroxychloroquine (PLAQUENIL) tablet 200 mg        200 mg Oral Daily 12/12/23 0840     12/11/23 1330  amoxicillin-clavulanate (AUGMENTIN)  875-125 MG per tablet 1 tablet        1 tablet Oral Every 12 hours 12/11/23 1231 12/13/23 2205   12/09/23 0600  ceFAZolin (ANCEF) IVPB 2g/100 mL premix  Status:  Discontinued        2 g 200 mL/hr over 30 Minutes Intravenous Every 8 hours 12/08/23 1410 12/11/23 1231   12/06/23 2000  vancomycin (VANCOCIN) 2,250 mg in sodium chloride 0.9 % 500 mL IVPB  Status:  Discontinued        2,250 mg 261.3 mL/hr over 120 Minutes Intravenous Every 24 hours 12/06/23 0557 12/06/23 1123   12/06/23 1123  vancomycin (VANCOCIN) IVPB 1000 mg/200 mL premix  Status:  Discontinued        1,000 mg 200 mL/hr over 60 Minutes Intravenous Every 12 hours 12/06/23 1123 12/08/23 1410   12/06/23 1000  hydroxychloroquine (PLAQUENIL) tablet 200 mg  Status:  Discontinued        200 mg Oral 2 times daily 12/06/23 0548 12/12/23 0840   12/06/23 0630  cefTRIAXone (ROCEPHIN) 1 g in sodium chloride 0.9 % 100 mL IVPB  Status:  Discontinued        1 g 200 mL/hr over 30 Minutes Intravenous Every 24 hours 12/06/23 0543 12/08/23 1410   12/05/23 2215  vancomycin (VANCOCIN) IVPB 1000 mg/200 mL premix       Placed in "Followed by" Linked Group   1,000 mg 200 mL/hr over 60 Minutes Intravenous  Once 12/05/23 2045 12/06/23 0012   12/05/23 2045  vancomycin (VANCOCIN) IVPB 1000 mg/200 mL premix       Placed in "Followed by" Linked Group   1,000 mg 200 mL/hr over 60 Minutes Intravenous  Once 12/05/23 2045 12/05/23 2230         Component Value Date/Time   SDES  12/06/2023 0601    BLOOD SITE NOT SPECIFIED Performed at [facility], [address], [place], [place] [address]    SPECREQUEST  12/06/2023 0601    BOTTLES DRAWN AEROBIC AND ANAEROBIC Blood Culture results may not be optimal due to an inadequate volume of blood received in culture bottles Performed at [facility], [address], [place], [place] [address]    CULT  12/06/2023 [id number]  NO GROWTH 5 DAYS Performed at [facility],  [address], [place], [place] [address]    REPTSTATUS 12/11/2023 FINAL 12/06/2023 0601  Radiology Studies: No results found.   LOS: 8 days   Total time spent in review of labs and imaging, patient evaluation, formulation of plan, documentation and communication with family:25 minutes.  Lanae Boast, MD  [facility]  12/14/2023, 10:40 AM

## 2023-12-14 NOTE — Plan of Care (Signed)
  Problem: Education: Goal: Knowledge of General Education information will improve Description Including pain rating scale, medication(s)/side effects and non-pharmacologic comfort measures Outcome: Progressing   Problem: Health Behavior/Discharge Planning: Goal: Ability to manage health-related needs will improve Outcome: Progressing   

## 2023-12-14 NOTE — Progress Notes (Signed)
Occupational Therapy Treatment Patient Details Name: Sierra Atkins MRN: [id number] DOB: [birth year] Today's Date: 12/14/2023   History of present illness 58 y.o. female  brought to [facility] with LE cellulitis, edema ,recently treated with no improvement. PMH: essential hypertension ,peripheral polyneuropathy, Bil. Hip DJD, LTKA craniotomy for benign tumor. Legs  Negative for DVT.   OT comments  Pt making good progress with functional goals. OT will continue to follow acutely to maximize level of function and safety      If plan is discharge home, recommend the following:  A lot of help with bathing/dressing/bathroom;A little help with walking and/or transfers;Assistance with cooking/housework;Assist for transportation;Help with stairs or ramp for entrance   Equipment Recommendations  Tub/shower bench    Recommendations for Other Services      Precautions / Restrictions Precautions Precautions: Fall Restrictions Weight Bearing Restrictions Per Provider Order: No       Mobility Bed Mobility Overal bed mobility: Needs Assistance Bed Mobility: Supine to Sit     Supine to sit: Contact guard, HOB elevated, Used rails          Transfers Overall transfer level: Needs assistance Equipment used: Rolling walker (2 wheels) Transfers: Sit to/from Stand, Bed to chair/wheelchair/BSC Sit to Stand: Contact guard assist     Step pivot transfers: Contact guard assist     General transfer comment: VCs hand placement     Balance Overall balance assessment: Needs assistance Sitting-balance support: Feet supported, No upper extremity supported Sitting balance-Leahy Scale: Good     Standing balance support: Bilateral upper extremity supported, Reliant on assistive device for balance, During functional activity Standing balance-Leahy Scale: Fair                             ADL either performed or assessed with clinical judgement   ADL Overall ADL's : Needs  assistance/impaired     Grooming: Wash/dry hands;Wash/dry face;Sitting;Set up               Lower Body Dressing: Maximal assistance Lower Body Dressing Details (indicate cue type and reason): donning socks seated EOB Toilet Transfer: Contact guard assist;Ambulation;Rolling walker (2 wheels);BSC/3in1   Toileting- Clothing Manipulation and Hygiene: Minimal assistance;Sit to/from stand       Functional mobility during ADLs: Contact guard assist;Rolling walker (2 wheels)      Extremity/Trunk Assessment Upper Extremity Assessment Upper Extremity Assessment: Overall WFL for tasks assessed   Lower Extremity Assessment Lower Extremity Assessment: Defer to PT evaluation        Vision Baseline Vision/History: 1 Wears glasses Ability to See in Adequate Light: 0 Adequate Patient Visual Report: No change from baseline     Perception     Praxis      Cognition Arousal: Alert Behavior During Therapy: WFL for tasks assessed/performed Overall Cognitive Status: Within Functional Limits for tasks assessed                                 General Comments: pleasant, jovial, hyperverbose        Exercises      Shoulder Instructions       General Comments      Pertinent Vitals/ Pain       Pain Assessment Pain Assessment: No/denies pain Pain Score: 4  Pain Location: BLEs from hips to feet Pain Descriptors / Indicators: Sore Pain Intervention(s): Monitored during session, Repositioned  Home Living  Prior Functioning/Environment              Frequency  Min 1X/week        Progress Toward Goals  OT Goals(current goals can now be found in the care plan section)  Progress towards OT goals: Progressing toward goals     Plan      Co-evaluation                 AM-PAC OT "6 Clicks" Daily Activity     Outcome Measure   Help from another person eating meals?: None Help from  another person taking care of personal grooming?: A Little Help from another person toileting, which includes using toliet, bedpan, or urinal?: A Little Help from another person bathing (including washing, rinsing, drying)?: A Lot Help from another person to put on and taking off regular upper body clothing?: A Little Help from another person to put on and taking off regular lower body clothing?: A Lot 6 Click Score: 17    End of Session Equipment Utilized During Treatment: Rolling walker (2 wheels);Gait belt;Other (comment) (BSC)  OT Visit Diagnosis: Other abnormalities of gait and mobility (R26.89);Muscle weakness (generalized) (M62.81);Pain Pain - part of body: Leg;Knee   Activity Tolerance Patient tolerated treatment well   Patient Left with call bell/phone within reach;with nursing/sitter in room;in chair   Nurse Communication          Time: 1610-9604 OT Time Calculation (min): 16 min  Charges: OT General Charges $OT Visit: 1 Visit OT Treatments $Self Care/Home Management : 8-22 mins    Galen Manila 12/14/2023, 1:18 PM

## 2023-12-15 DIAGNOSIS — L03116 Cellulitis of left lower limb: Secondary | ICD-10-CM | POA: Diagnosis not present

## 2023-12-15 NOTE — Progress Notes (Signed)
PROGRESS NOTE Sierra Atkins  ZOX:[id number] DOB: 09/07/1966 DOA: 12/05/2023 PCP: Default, Provider, MD  Brief Narrative/Hospital Course: 58 y.o. female with medical history significant for essential hypertension on Exforge, Coreg, Lasix, HCTZ, peripheral polyneuropathy, lupus presenting with 10 days of progressive LUL EEL edema after left knee, swelling warmth.  She was diagnosed with left lower extremity cellulitis completed 5 days of oral doxycycline from 11/30/2023 but without improvement.  However onset of symptoms was 3 weeks ago with worsening edema in the bilateral extremities had venous ultrasound on 12/13 negative for DVT. Seen at the med [facility], in the ED vitals fairly stable not hypoxic Labs with hypokalemia normal lactate WBC count.  Blood culture sent, imaging with chest x-ray-mild interstitial prominence -smoking-related versus edema.  Left leg x-ray with infiltrative subcutaneous edema throughout calf, total knee prosthesis.  Patient was given IV Lasix, pain medication Toradol vancomycin and admitted.  Patient is manage IV antibiotics MRSA was negative switch to Ancef.  Patient was  placed on IV Lasix.  Patient completed antibiotics, at this time waiting for placement.    Subjective: Patient seen and examined this morning No visual blurring numbness tingling focal weakness No new complaints, unable to put on compression was too long  Principal Problem:   Cellulitis of left lower extremity Active Problems:   Hypokalemia   Peripheral polyneuropathy   Essential hypertension   Lupus (systemic lupus erythematosus) (HCC)    Moderate severe bilateral lower extremity cellulitis Chronic venous stasis and polyneuropathy: Failed outpatient oral antibiotics and oral Lasix.  Managed with IV Lasix, echo with poor window.  BNP normal.  Initially on vancomycin and ceftriaxone switched to Ancef 1/15 as MRSA negative,and subsequently switched to Augmentin, IV Lasix switched to p.o.   Recent similar admission at [facility].  Duplex prior to admission was negative for DVT. Refusing low-sodium diet.   Continue oral diuretics continue PT OT awaiting for skilled nursing facility.  SLE: Stable,cont home Plaquenil and methotrexate.    Hypokalemia: Resovled   HTN: Stable on lasix   Peripheral neuropathy Chronic bilateral hip pain-osteoarthritis of hips shoulders and knees Chronic opiate use: Continue home gabapentin, minimize narcotics.  Patient endorsed taking Oxy 10 mg prn q6hr at home and Norco for breakthrough pain.continue current regimen with caution   History of brain aneurysm status post VP shunt: She follows up at [facility] routinely last CT angio neck and brain is stable on July 2024.  Overall doing well and stable advised outpatient follow-up   Morbid Obesity: Patient's Body mass index is 56.84 kg/m. : Will benefit with PCP follow-up, weight loss  healthy lifestyle and outpatient sleep evaluation.  DVT prophylaxis: SCDs Start: 12/06/23 0517 Code Status:   Code Status: Full Code Family Communication: plan of care discussed with patient, daughter not available on phone  Patient status is: Remains hospitalized because of severity of illness Level of care: Med-Surg   Dispo: The patient is from: Home at daughter's house            Anticipated disposition: SNF pending if unable to find SNF plan for DMEs delivery either to her house in [place] or to daughters house locally   Objective: Vitals last 24 hrs: Vitals:   12/14/23 1336 12/14/23 2205 12/15/23 0642 12/15/23 1159  BP: 128/70 130/72 (!) 146/66 (!) 122/58  Pulse: 82 78 75 84  Resp: 16 18 18    Temp: 98.7 F (37.1 C) 98.8 F (37.1 C) 97.9 F (36.6 C) 98.1 F (36.7 C)  TempSrc: Oral  Oral    SpO2: 100% 95% 99% 96%  Weight:      Height:       Weight change:   Physical Examination: General exam: alert awake, oriented x3 pleasant HEENT:Oral mucosa moist, Ear/Nose WNL grossly Respiratory  system: Bilaterally clear BS,no use of accessory muscle Cardiovascular system: S1 & S2 +, No JVD. Gastrointestinal system: Abdomen soft,NT,ND, BS+ Nervous System: Alert, awake, moving all extremities,and following commands. Extremities: LE edema +,distal peripheral pulses palpable and warm.  Skin: No rashes,no icterus. MSK: Normal muscle bulk,tone, power   Medications reviewed:  Scheduled Meds:  enoxaparin (LOVENOX) injection  70 mg Subcutaneous Daily   fluconazole  100 mg Oral Daily   furosemide  40 mg Oral BID   gabapentin  300 mg Oral TID   hydroxychloroquine  200 mg Oral Daily  Continuous Infusions:   Diet Order             Diet regular Room service appropriate? Yes; Fluid consistency: Thin  Diet effective now                  Intake/Output Summary (Last 24 hours) at 12/15/2023 1420 Last data filed at 12/15/2023 1100 Gross per 24 hour  Intake 1100 ml  Output 800 ml  Net 300 ml   Net IO Since Admission: -2,942.43 mL [12/15/23 1420]  Wt Readings from Last 3 Encounters:  12/13/23 (!) 150.2 kg  11/30/23 132.5 kg  06/29/23 132.5 kg    Unresulted Labs (From admission, onward)    None     Data Reviewed: I have personally reviewed following labs and imaging studies CBC: Recent Labs  Lab 12/09/23 0443  WBC 6.9  HGB 10.6*  HCT 32.8*  MCV 99.7  PLT 285   Basic Metabolic Panel:  Recent Labs  Lab 12/09/23 0443 12/10/23 0509 12/11/23 0542 12/12/23 0506  NA 135 139 137 134*  K 4.1 3.8 4.1 4.3  CL 100 102 99 99  CO2 26 28 31 25   GLUCOSE 95 96 95 101*  BUN 15 16 19  21*  CREATININE 0.76 0.69 0.72 0.54  CALCIUM 8.4* 8.8* 8.7* 8.6*  MG  --   --   --  2.4   GFR: Estimated Creatinine Clearance: 116.5 mL/min (by C-G formula based on SCr of 0.54 mg/dL). Liver Function Tests:  No results for input(s): "PROCALCITON", "LATICACIDVEN" in the last 168 hours. Recent Results (from the past 240 hours)  Blood culture (routine x 2)     Status: None   Collection Time:  12/05/23  7:11 PM   Specimen: BLOOD  Result Value Ref Range Status   Specimen Description   Final    BLOOD RIGHT ANTECUBITAL Performed at [facility], [address] [place], [place] [address]    Special Requests   Final    BOTTLES DRAWN AEROBIC AND ANAEROBIC Blood Culture adequate volume Performed at [facility], [address] [place], [place] [address]    Culture   Final    NO GROWTH 5 DAYS Performed at [facility], [address] [place], [place] [address]    Report Status 12/10/2023 FINAL  Final  Blood culture (routine x 2)     Status: None   Collection Time: 12/06/23  6:01 AM   Specimen: BLOOD  Result Value Ref Range Status   Specimen Description   Final    BLOOD SITE NOT SPECIFIED Performed at [facility], [address] [place], [place]  [address]    Special Requests   Final    BOTTLES DRAWN AEROBIC AND ANAEROBIC Blood Culture results may not be optimal due to an inadequate volume of blood received in culture bottles Performed at [facility], [address], [place], [place] [address]    Culture   Final    NO GROWTH 5 DAYS Performed at [facility], [address] [place], [place] [address]    Report Status 12/11/2023 FINAL  Final  MRSA Next Gen by PCR, Nasal     Status: None   Collection Time: 12/08/23 11:52 AM   Specimen: Nasal Mucosa; Nasal Swab  Result Value Ref Range Status   MRSA by PCR Next Gen NOT DETECTED NOT DETECTED Final    Comment: (NOTE) The GeneXpert MRSA Assay (FDA approved for NASAL specimens only), is one component of a comprehensive MRSA colonization surveillance program. It is not intended to diagnose MRSA infection nor to guide or monitor treatment for MRSA infections. Test performance is not FDA approved in patients less than 70 years old. Performed at [facility], [address] [place], [place] [address]    Antimicrobials/Microbiology: Anti-infectives (From admission, onward)    Start     Dose/Rate Route Frequency Ordered Stop   12/14/23 1700  fluconazole (DIFLUCAN) tablet 100 mg        100 mg Oral Daily 12/14/23 1602 12/17/23 0959   12/12/23 1000  hydroxychloroquine (PLAQUENIL) tablet 200 mg        200 mg Oral Daily 12/12/23 0840     12/11/23 1330  amoxicillin-clavulanate (AUGMENTIN) 875-125 MG per tablet 1 tablet        1 tablet Oral Every 12 hours 12/11/23 1231 12/13/23 2205   12/09/23 0600  ceFAZolin (ANCEF) IVPB 2g/100 mL premix  Status:  Discontinued        2 g 200 mL/hr over 30 Minutes Intravenous Every 8 hours 12/08/23 1410 12/11/23 1231   12/06/23 2000  vancomycin (VANCOCIN) 2,250 mg in sodium chloride 0.9 % 500 mL IVPB  Status:  Discontinued        2,250 mg 261.3 mL/hr over 120 Minutes Intravenous Every 24 hours 12/06/23 0557 12/06/23 1123   12/06/23 1123  vancomycin (VANCOCIN) IVPB 1000 mg/200 mL premix  Status:  Discontinued        1,000 mg 200 mL/hr over 60 Minutes Intravenous Every 12 hours 12/06/23 1123 12/08/23 1410   12/06/23 1000  hydroxychloroquine (PLAQUENIL) tablet 200 mg  Status:  Discontinued        200 mg Oral 2 times daily 12/06/23 0548 12/12/23 0840   12/06/23 0630  cefTRIAXone (ROCEPHIN) 1 g in sodium chloride 0.9 % 100 mL IVPB  Status:  Discontinued        1 g 200 mL/hr over 30 Minutes Intravenous Every 24 hours 12/06/23 0543 12/08/23 1410   12/05/23 2215  vancomycin (VANCOCIN) IVPB 1000 mg/200 mL premix       Placed in "Followed by" Linked Group   1,000 mg 200 mL/hr over 60 Minutes Intravenous  Once 12/05/23 2045 12/06/23 0012   12/05/23 2045  vancomycin (VANCOCIN) IVPB 1000 mg/200 mL premix       Placed in "Followed by" Linked Group   1,000 mg 200 mL/hr over 60 Minutes Intravenous  Once 12/05/23 2045 12/05/23 2230         Component Value Date/Time   SDES  12/06/2023 0601    BLOOD SITE NOT SPECIFIED Performed at [facility],  [address], [place], [place] [address]    SPECREQUEST  12/06/2023 0601    BOTTLES DRAWN AEROBIC AND ANAEROBIC Blood Culture results may not be optimal due to an inadequate volume of blood received in culture bottles Performed at [facility], [address], [place], [place] [address]    CULT  12/06/2023 0601    NO GROWTH 5 DAYS Performed at [facility], [address] [place], [place] [address]    REPTSTATUS 12/11/2023 FINAL 12/06/2023 0601  Radiology Studies: No results found.   LOS: 9 days   Total time spent in review of labs and imaging, patient evaluation, formulation of plan, documentation and communication with family:25 minutes.  Lanae Boast, MD  [facility]  12/15/2023, 2:20 PM

## 2023-12-15 NOTE — TOC Progression Note (Signed)
Transition of Care [facility]) - Progression Note    Patient Details  Name: Sierra Atkins MRN: [id number] Date of Birth: Aug 24, 1966  Transition of Care [facility]) CM/SW Contact  Howell Rucks, RN Phone Number: 12/15/2023, 1:55 PM  Clinical Narrative:   Text received from pt's dtr of SNF with [facility] for [facility], [facility] , [place], [place], [facility], [place], [address] and Rehab in [place], [place], [address] [place] [address] and Rehab in [place], [place]). NCM sent to text to patient that all SNF had been contacted with decline for bed offer except [facility] and Rehab and [facility] and Rehab, informed NCM will contact those 2 facilities only. Also informed if not facility accepts for short term rehab, we need plan for delivery of equipment to either dtrs home or pt's home in [place], [facility] PT/OT/HHA has been arranged.   -2:00pm Call to [facility] and rehab at [phone] and [facility] and Rehab at [phone], message left with NCM name and phone number requesting call back for potential review for short term rehab, await call back.     Expected Discharge Plan: Home/Self Care Barriers to Discharge: Continued Medical Work up  Expected Discharge Plan and Services       Living arrangements for the past 2 months: Single Family Home                                       Social Determinants of Health (SDOH) Interventions SDOH Screenings   Food Insecurity: No Food Insecurity (12/06/2023)  Housing: Unknown (12/06/2023)  Transportation Needs: No Transportation Needs (12/06/2023)  Utilities: Not At Risk (12/06/2023)  Social Connections: Unknown (12/06/2023)  Stress: No Stress Concern Present (08/13/2020)   [facility] from [facility]  Tobacco Use: High Risk (12/06/2023)    Readmission Risk Interventions    12/06/2023   11:51 AM  Readmission Risk  Prevention Plan  Transportation Screening Complete  PCP or Specialist Appt within 5-7 Days Complete  Home Care Screening Complete  Medication Review (RN CM) Complete

## 2023-12-15 NOTE — Plan of Care (Signed)
  Problem: Education: Goal: Knowledge of General Education information will improve Description Including pain rating scale, medication(s)/side effects and non-pharmacologic comfort measures Outcome: Progressing   

## 2023-12-15 NOTE — Plan of Care (Signed)
  Problem: Education: Goal: Knowledge of General Education information will improve Description Including pain rating scale, medication(s)/side effects and non-pharmacologic comfort measures Outcome: Progressing   Problem: Health Behavior/Discharge Planning: Goal: Ability to manage health-related needs will improve Outcome: Progressing   

## 2023-12-15 NOTE — Progress Notes (Signed)
PT Cancellation Note  Patient Details Name: Kitanna Urness MRN: [id number] DOB: [birth year]   Cancelled Treatment:    Reason Eval/Treat Not Completed: Pain limiting ability to participate (pt reports bone pain and headache, she stated she's not able to tolerate PT today. Will follow.)   Tamala Ser PT 12/15/2023  Acute Rehabilitation Services  Office [phone]

## 2023-12-16 DIAGNOSIS — L03116 Cellulitis of left lower limb: Secondary | ICD-10-CM | POA: Diagnosis not present

## 2023-12-16 MED ORDER — OXYCODONE HCL 10 MG PO TABS: 10.0000 mg | ORAL_TABLET | Freq: Four times a day (QID) | ORAL | 0 refills | Status: DC | PRN

## 2023-12-16 MED ORDER — METHOCARBAMOL 500 MG PO TABS
500.0000 mg | ORAL_TABLET | Freq: Three times a day (TID) | ORAL | Status: DC | PRN
  Administered 2023-12-16: 500 mg via ORAL
  Filled 2023-12-16: qty 1

## 2023-12-16 MED ORDER — SPIRONOLACTONE 25 MG PO TABS
25.0000 mg | ORAL_TABLET | Freq: Every day | ORAL | Status: DC
  Administered 2023-12-16: 25 mg via ORAL
  Filled 2023-12-16: qty 1

## 2023-12-16 MED ORDER — ZOLPIDEM TARTRATE 10 MG PO TABS: 10.0000 mg | ORAL_TABLET | Freq: Every day | ORAL | 0 refills | Status: DC

## 2023-12-16 MED ORDER — METHOTREXATE SODIUM 2.5 MG PO TABS: 15.0000 mg | ORAL_TABLET | ORAL | Status: DC

## 2023-12-16 NOTE — Progress Notes (Signed)
Nurse reviewed discharge instructions with pt. Pt verbalized understanding of discharge instructions, follow up appointments and new medications.  Pt is waiting for her daughter to get off work to pick her up from hospital.

## 2023-12-16 NOTE — Progress Notes (Signed)
PT Cancellation Note  Patient Details Name: Sierra Atkins MRN: [id number] DOB: 01/22/1966   Cancelled Treatment:    Reason Eval/Treat Not Completed: Patient declined, no reason specified (pt stated she's DCing later today and doesn't want to do PT today.)   Tamala Ser PT 12/16/2023  Acute Rehabilitation Services  Office [phone]

## 2023-12-16 NOTE — TOC Progression Note (Addendum)
Transition of Care [facility]) - Progression Note    Patient Details  Name: Sierra Atkins MRN: [id number] Date of Birth: Nov 11, 1966  Transition of Care [facility]) CM/SW Contact  Howell Rucks, RN Phone Number: 12/16/2023, 8:36 AM  Clinical Narrative:   Pe pt's dtr's request, call to [facility]hab in [place], [place] to request review for short term rehab bed offer, vm left with NCM name and phone number requesting call back for [facility], admissions coordinator, await call back.   -10:35am Call received from Elida, admissions coord with [facility] and Rehab, reports no beds available, states she will contact pt's dtr.   -10:36am Call received from pt's dtr, [name], informed of no bed availability at [facility] and Rehab, voiced understanding. [name] reports pt will be discharging to her home at [address], [place], [place] [address], request to cancel shower chair as it will not fit in her shower, states she will not be home until after she picks up patient after she gets off work at 5:00pm,   -10:50am [employer], rep-Jermaine, notified of discharge and that dtr request DME delivery after 5:00pm,  states he will reach out to pt's dtr to arrange  delivery.     Expected Discharge Plan: Home/Self Care Barriers to Discharge: Continued Medical Work up  Expected Discharge Plan and Services       Living arrangements for the past 2 months: Single Family Home                                       Social Determinants of Health (SDOH) Interventions SDOH Screenings   Food Insecurity: No Food Insecurity (12/06/2023)  Housing: Unknown (12/06/2023)  Transportation Needs: No Transportation Needs (12/06/2023)  Utilities: Not At Risk (12/06/2023)  Social Connections: Unknown (12/06/2023)  Stress: No Stress Concern Present (08/13/2020)   Received from [facility]  Tobacco Use: High Risk (12/06/2023)    Readmission Risk Interventions    12/06/2023   11:51 AM   Readmission Risk Prevention Plan  Transportation Screening Complete  PCP or Specialist Appt within 5-7 Days Complete  Home Care Screening Complete  Medication Review (RN CM) Complete

## 2023-12-16 NOTE — Discharge Summary (Signed)
Physician Discharge Summary  Sierra Atkins ZOX:[id number] DOB: [birth year] DOA: 12/05/2023  PCP: Default, Provider, MD  Admit date: 12/05/2023 Discharge date: 12/16/2023 Recommendations for Outpatient Follow-up:  Follow up with PCP, and your other physicians in 1 weeks-call for appointment Please obtain BMP/CBC in one week  Discharge Dispo: Home w/ [facility] Discharge Condition: Stable Code Status:   Code Status: Full Code Diet recommendation:  Diet Order             Diet regular Room service appropriate? Yes; Fluid consistency: Thin  Diet effective now                    Brief/Interim Summary: 58 y.o. female with medical history significant for essential hypertension on Exforge, Coreg, Lasix, HCTZ, peripheral polyneuropathy, lupus presenting with 10 days of progressive LUL EEL edema after left knee, swelling warmth.  She was diagnosed with left lower extremity cellulitis completed 5 days of oral doxycycline from 11/30/2023 but without improvement.  However onset of symptoms was 3 weeks ago with worsening edema in the bilateral extremities had venous ultrasound on 12/13 negative for DVT. Seen at the med [facility], in the ED vitals fairly stable not hypoxic Labs with hypokalemia normal lactate WBC count.  Blood culture sent, imaging with chest x-ray-mild interstitial prominence -smoking-related versus edema.  Left leg x-ray with infiltrative subcutaneous edema throughout calf, total knee prosthesis.  Patient was given IV Lasix, pain medication Toradol vancomycin and admitted.  Patient is manage IV antibiotics MRSA was negative switch to Ancef.  Patient was  placed on IV Lasix.  Patient completed antibiotics, at this time waiting for placement.  Subsequently patient decided to go home to daughter's house, Dme equipments including hospital bed has been delivered 1/23   Discharge Diagnoses:  Principal Problem:   Cellulitis of left lower extremity Active Problems:   Hypokalemia    Peripheral polyneuropathy   Essential hypertension   Lupus (systemic lupus erythematosus) (HCC)  Moderate severe bilateral lower extremity cellulitis Chronic venous stasis and polyneuropathy: Failed outpatient oral antibiotics and oral Lasix.Managed with IV Lasix, echo with poor window.  BNP normal.  Initially on vancomycin and ceftriaxone switched to Ancef 1/15 as MRSA negative,and subsequently switched to Augmentin, IV Lasix switched to p.o.  Recent similar admission at [facility].  Duplex prior to admission was negative for DVT. Refusing low-sodium diet.   Continue oral diuretics, Aldactone, planning for discharge to daughters home She will cont PTOT   SLE: Stable,cont home Plaquenil and methotrexate.    Hypokalemia: Resovled   HTN: Stable on lasix.   Peripheral neuropathy Chronic bilateral hip pain-osteoarthritis of hips shoulders and knees Chronic opiate use: Continue home gabapentin, minimize narcotics.  Patient requesting some prescription for home narcotics-will write for a few days supply and she will follow-up with her PCP to resume and to get new rx. She is also requesting Ambien too.   History of brain aneurysm status post VP shunt: She follows up at [facility] routinely last CT angio neck and brain is stable on July 2024. No focal weakness numbness tingling or any neurological signs.  Mobilizing well. No nausea vomiting or significant headache Advise outpatient follow-up with her neurology team  Morbid Obesity: Patient's Body mass index is 56.84 kg/m. : Will benefit with PCP follow-up, weight loss  healthy lifestyle and outpatient sleep evaluation.   Consults: None  Subjective: Alert awake pleasant Discharge Exam: Vitals:   12/15/23 2128 12/16/23 0611  BP: 100/73 124/77  Pulse: 83 71  Resp: 18 18  Temp: 99 F (37.2 C) 97.8 F (36.6 C)  SpO2: 99% 97%  General: Pt is alert, awake, not in acute distress Cardiovascular: RRR, S1/S2 +, no rubs, no  gallops Respiratory: CTA bilaterally, no wheezing, no rhonchi Abdominal: Soft, NT, ND, bowel sounds + Extremities: no edema, no cyanosis  Discharge Instructions  Discharge Instructions     Discharge instructions   Complete by: As directed    Please follow-up with neurology at [facility] with your primary care physician  Please call call MD or return to ER for similar or worsening recurring problem that brought you to hospital or if any fever,nausea/vomiting,abdominal pain, uncontrolled pain, chest pain,  shortness of breath or any other alarming symptoms.  Please follow-up your doctor as instructed in a week time and call the office for appointment.  Please avoid alcohol, smoking, or any other illicit substance and maintain healthy habits including taking your regular medications as prescribed.  You were cared for by a hospitalist during your hospital stay. If you have any questions about your discharge medications or the care you received while you were in the hospital after you are discharged, you can call the unit and ask to speak with the hospitalist on call if the hospitalist that took care of you is not available.  Once you are discharged, your primary care physician will handle any further medical issues. Please note that NO REFILLS for any discharge medications will be authorized once you are discharged, as it is imperative that you return to your primary care physician (or establish a relationship with a primary care physician if you do not have one) for your aftercare needs so that they can reassess your need for medications and monitor your lab values   Increase activity slowly   Complete by: As directed    No wound care   Complete by: As directed       Allergies as of 12/16/2023       Reactions   Fentanyl Shortness Of Breath   Codeine    Morphine And Codeine         Medication List     STOP taking these medications    amLODipine-valsartan 5-160 MG  tablet Commonly known as: EXFORGE   carvedilol 6.25 MG tablet Commonly known as: COREG   doxycycline 100 MG capsule Commonly known as: VIBRAMYCIN   hydrochlorothiazide 25 MG tablet Commonly known as: HYDRODIURIL   predniSONE 20 MG tablet Commonly known as: DELTASONE       TAKE these medications    diclofenac Sodium 1 % Gel Commonly known as: VOLTAREN Apply 1 Application topically as needed (pain).   Fish Oil 1000 MG Caps Take 1,000 mg by mouth daily.   fluticasone 50 MCG/ACT nasal spray Commonly known as: FLONASE Place 2 sprays into both nostrils daily for 7 days. What changed:  how much to take when to take this reasons to take this   furosemide 40 MG tablet Commonly known as: LASIX Take 40 mg by mouth 2 (two) times daily.   gabapentin 300 MG capsule Commonly known as: NEURONTIN Take 300 mg by mouth 3 (three) times daily.   HYDROcodone-acetaminophen 10-325 MG tablet Commonly known as: NORCO Take 1 tablet by mouth every 6 (six) hours as needed.   hydroxychloroquine 200 MG tablet Commonly known as: PLAQUENIL Take 200 mg by mouth daily.   methocarbamol 500 MG tablet Commonly known as: ROBAXIN Take 500 mg by mouth as needed for muscle spasms.   methotrexate  2.5 MG tablet Commonly known as: RHEUMATREX Take 15 mg by mouth once a week.   multivitamin capsule Take 1 capsule by mouth daily.   Oxycodone HCl 10 MG Tabs Take 1 tablet (10 mg total) by mouth every 6 (six) hours as needed for up to 10 doses (pain). What changed: Another medication with the same name was removed. Continue taking this medication, and follow the directions you see here.   potassium chloride SA 20 MEQ tablet Commonly known as: KLOR-CON M Take 1 tablet (20 mEq total) by mouth daily for 6 days.   spironolactone 25 MG tablet Commonly known as: ALDACTONE Take 1 tablet by mouth daily.   zolpidem 10 MG tablet Commonly known as: AMBIEN Take 1 tablet (10 mg total) by mouth at bedtime  for 7 days.               Durable Medical Equipment  (From admission, onward)           Start     Ordered   12/14/23 1047  For home use only DME Bedside commode  Once       Question:  Patient needs a bedside commode to treat with the following condition  Answer:  Physical deconditioning   12/14/23 1046   12/14/23 1047  For home use only DME Shower stool  Once        12/14/23 1046   12/14/23 1038  For home use only DME Specialty mattress  Once       Comments: THERAPEUTIC MATTRESS   12/14/23 1038   12/14/23 1038  For home use only DME Hospital bed  Once       Question Answer Comment  Length of Need 12 Months   The above medical condition requires: Patient requires the ability to reposition frequently   Head must be elevated greater than: 45 degrees   Bed type Semi-electric      12/14/23 1038            Follow-up Information     [facility] Follow up.   Why: [facility]   Home Phyical Therapy, Occupational Therapy, Home Health Aide Contact information: [address] [place] [address] [phone]         [facility] Follow up.   Why: Hospital Bed [facility] Shower Chair Contact information: [address] [place], [place] [address]  Phone: [phone]               Allergies  Allergen Reactions   Fentanyl Shortness Of Breath   Codeine    Morphine And Codeine     The results of significant diagnostics from this hospitalization (including imaging, microbiology, ancillary and laboratory) are listed below for reference.    Microbiology: Recent Results (from the past 240 hours)  MRSA Next Gen by PCR, Nasal     Status: None   Collection Time: 12/08/23 11:52 AM   Specimen: Nasal Mucosa; Nasal Swab  Result Value Ref Range Status   MRSA by PCR Next Gen NOT DETECTED NOT DETECTED Final    Comment: (NOTE) The GeneXpert MRSA Assay (FDA approved for NASAL specimens only), is one component of a comprehensive MRSA  colonization surveillance program. It is not intended to diagnose MRSA infection nor to guide or monitor treatment for MRSA infections. Test performance is not FDA approved in patients less than 7 years old. Performed at [facility], [address], [place], [place] [address]  Procedures/Studies: DG Knee 1-2 Views Left Result Date: 12/08/2023 CLINICAL DATA:  Prior knee surgery in 2011.  Knee pain. EXAM: LEFT KNEE - 1-2 VIEW COMPARISON:  Left tibia and fibula radiographs 12/05/2023 FINDINGS: Status post total left knee arthroplasty. No perihardware lucency is seen to indicate hardware failure or loosening. No knee joint effusion. Mild chronic enthesopathic change at the quadriceps insertion on the patella. No acute fracture dislocation. Mild diffuse subcutaneous fat edema and swelling, similar to prior. No subcutaneous air. IMPRESSION: Status post total left knee arthroplasty without evidence of hardware failure. Electronically Signed   By: Neita Garnet M.D.   On: 12/08/2023 14:15   ECHOCARDIOGRAM COMPLETE Result Date: 12/06/2023    ECHOCARDIOGRAM REPORT   Patient Name:   Sierra Atkins Date of Exam: 12/06/2023 Medical Rec #:  [id number]       Height:       67.0 in Accession #:    [id number]      Weight:       278.0 lb Date of Birth:  1966-10-22      BSA:          2.326 m Patient Age:    57 years        BP:           137/74 mmHg Patient Gender: F               HR:           73 bpm. Exam Location:  Inpatient Procedure: 2D Echo, Cardiac Doppler, Color Doppler and Intracardiac            Opacification Agent Indications:    CHF-Acute Diastolic I50.31  History:        Patient has no prior history of Echocardiogram examinations.                 Risk Factors:Hypertension.  Sonographer:    Darlys Gales Referring Phys: [id number] JUSTIN B HOWERTER IMPRESSIONS  1. Poor accoustic windows. Left ventricular ejection fraction, by estimation, is 60 to 65%. The left ventricle has normal function.  The left ventricle has no regional wall motion abnormalities. Left ventricular diastolic parameters are indeterminate.  2. Right ventricular systolic function was not well visualized. The right ventricular size is not well visualized.  3. The mitral valve was not well visualized. No evidence of mitral valve regurgitation.  4. Aortic valve regurgitation is not visualized.  5. The inferior vena cava IVC is not well visualized. FINDINGS  Left Ventricle: Poor accoustic windows. Left ventricular ejection fraction, by estimation, is 60 to 65%. The left ventricle has normal function. The left ventricle has no regional wall motion abnormalities. Definity contrast agent was given IV to delineate the left ventricular endocardial borders. The left ventricular internal cavity size was normal in size. Left ventricular diastolic parameters are indeterminate. Right Ventricle: The right ventricular size is not well visualized. Right ventricular systolic function was not well visualized. Left Atrium: Left atrial size was not well visualized. Right Atrium: Right atrial size was not well visualized. Pericardium: There is no evidence of pericardial effusion. Mitral Valve: The mitral valve was not well visualized. No evidence of mitral valve regurgitation. Tricuspid Valve: Tricuspid valve regurgitation is not demonstrated. Aortic Valve: Aortic valve regurgitation is not visualized. Aortic valve peak gradient measures 21.3 mmHg. Pulmonic Valve: Pulmonic valve regurgitation is not visualized. Aorta: The aortic root and ascending aorta are structurally normal, with no evidence of dilitation. Venous: The inferior vena cava IVC is not well visualized.  IAS/Shunts: The interatrial septum was not well visualized.  LEFT VENTRICLE PLAX 2D LVIDd:         3.60 cm LVIDs:         2.30 cm LV PW:         1.00 cm LV IVS:        1.60 cm LVOT diam:     2.10 cm LVOT Area:     3.46 cm  AORTIC VALVE AV Vmax:      231.00 cm/s AV Peak Grad: 21.3 mmHg  AORTA  Ao Root diam: 3.30 cm  SHUNTS Systemic Diam: 2.10 cm Carolan Clines Electronically signed by Carolan Clines Signature Date/Time: 12/06/2023/3:20:11 PM    Final    DG Tibia/Fibula Left Result Date: 12/05/2023 CLINICAL DATA:  Cutaneous infection/cellulitis. Worsening lower extremity swelling and body aches. EXAM: LEFT TIBIA AND FIBULA - 2 VIEW COMPARISON:  None Available. FINDINGS: Total knee prosthesis observed, no abnormal lucency around the components of the prosthesis to suggest loosening or infection. Infiltrative subcutaneous edema is present throughout the calf. No abnormal gas in the soft tissues. IMPRESSION: 1. Infiltrative subcutaneous edema throughout the calf. No abnormal gas in the soft tissues. 2. 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 Electronically Signed   By: Gaylyn Rong M.D.   On: 12/05/2023 21:25   DG Chest 2 View Result Date: 12/05/2023 CLINICAL DATA:  Worsening bilateral lower leg cellulitis. Body aches. EXAM: CHEST - 2 VIEW COMPARISON:  Chest x-ray dated November 22, 2023. FINDINGS: Stable cardiomediastinal silhouette, accentuated by technique. Mild interstitial prominence. No focal consolidation, pleural effusion, or pneumothorax. No acute osseous abnormality. IMPRESSION: 1. Mild interstitial prominence may be smoking related or reflect interstitial edema. Electronically Signed   By: Obie Dredge M.D.   On: 12/05/2023 18:39   VAS [place] LOWER EXTREMITY VENOUS (DVT) (ONLY [facility] & [facility]) Result Date: 11/22/2023  Lower Venous DVT Study Patient Name:  Sierra Atkins  Date of Exam:   11/22/2023 Medical Rec #: [id number]        Accession #:    [id number] Date of Birth: [birth year]       Patient Gender: F Patient Age:   19 years Exam Location:  [facility] Procedure:      VAS [place] LOWER EXTREMITY VENOUS (DVT) Referring Phys: Alvira Monday --------------------------------------------------------------------------------  Indications: Swelling, and Edema.  Risk Factors: Obesity.  Limitations: Body habitus, poor ultrasound/tissue interface and Patient unable to tolerate compression. Comparison Study: None. Performing Technologist: Shona Simpson  Examination Guidelines: A complete evaluation includes B-mode imaging, spectral Doppler, color Doppler, and power Doppler as needed of all accessible portions of each vessel. Bilateral testing is considered an integral part of a complete examination. Limited examinations for reoccurring indications may be performed as noted. The reflux portion of the exam is performed with the patient in reverse Trendelenburg.  +---------+---------------+---------+-----------+----------+-------------------+ RIGHT    CompressibilityPhasicitySpontaneityPropertiesThrombus Aging      +---------+---------------+---------+-----------+----------+-------------------+ CFV                     Yes      Yes                                      +---------+---------------+---------+-----------+----------+-------------------+ SFJ                              Yes                                      +---------+---------------+---------+-----------+----------+-------------------+  FV Prox                          Yes                                      +---------+---------------+---------+-----------+----------+-------------------+ FV Mid                           Yes                                      +---------+---------------+---------+-----------+----------+-------------------+ FV Distal                        Yes                                      +---------+---------------+---------+-----------+----------+-------------------+ PFV                              Yes                                      +---------+---------------+---------+-----------+----------+-------------------+ POP                     Yes      Yes                                       +---------+---------------+---------+-----------+----------+-------------------+ PTV                                                   Not well visualized +---------+---------------+---------+-----------+----------+-------------------+ PERO                                                  Not well visualized +---------+---------------+---------+-----------+----------+-------------------+   +---------+---------------+---------+-----------+----------+-------------------+ LEFT     CompressibilityPhasicitySpontaneityPropertiesThrombus Aging      +---------+---------------+---------+-----------+----------+-------------------+ CFV                     Yes      Yes                                      +---------+---------------+---------+-----------+----------+-------------------+ SFJ                              Yes                                      +---------+---------------+---------+-----------+----------+-------------------+ FV Prox  Yes                                      +---------+---------------+---------+-----------+----------+-------------------+ FV Mid                           Yes                                      +---------+---------------+---------+-----------+----------+-------------------+ FV Distal                        Yes                                      +---------+---------------+---------+-----------+----------+-------------------+ PFV                              Yes                                      +---------+---------------+---------+-----------+----------+-------------------+ POP                     Yes      Yes                                      +---------+---------------+---------+-----------+----------+-------------------+ PTV                                                   Not well visualized +---------+---------------+---------+-----------+----------+-------------------+  PERO                                                  Not well visualized +---------+---------------+---------+-----------+----------+-------------------+     Summary: BILATERAL: - No evidence of deep vein thrombosis seen in the lower extremities, bilaterally. -No evidence of popliteal cyst, bilaterally.   *See table(s) above for measurements and observations. Electronically signed by Carolynn Sayers on 11/22/2023 at 10:50:52 AM.    Final    DG Chest Portable 1 View Result Date: 11/22/2023 CLINICAL DATA:  58 year old female with lower extremity swelling and shortness of breath. EXAM: PORTABLE CHEST 1 VIEW COMPARISON:  Chest radiographs 08/12/2023 and earlier. FINDINGS: Portable AP semi upright view at 0421 hours. Stable somewhat low lung volumes. Stable mediastinal contours, heart size at the upper limits of normal. Visualized tracheal air column is within normal limits. Pulmonary vascularity appears decreased compared to September radiographs. No edema, pneumothorax. No convincing pleural effusion. No confluent lung opacity. No acute osseous abnormality identified. IMPRESSION: Low lung volumes, otherwise no acute cardiopulmonary abnormality. Electronically Signed   By: Odessa Fleming M.D.   On: 11/22/2023 04:50    Labs: BNP (last 3 results) Recent Labs    11/22/23 0021 12/06/23 0551 12/09/23 0440  BNP  24.3 27.1 29.0   Basic Metabolic Panel: Recent Labs  Lab 12/10/23 0509 12/11/23 0542 12/12/23 0506  NA 139 137 134*  K 3.8 4.1 4.3  CL 102 99 99  CO2 28 31 25   GLUCOSE 96 95 101*  BUN 16 19 21*  CREATININE 0.69 0.72 0.54  CALCIUM 8.8* 8.7* 8.6*  MG  --   --  2.4  Urinalysis    Component Value Date/Time   COLORURINE YELLOW 12/05/2023 1724   APPEARANCEUR CLOUDY (A) 12/05/2023 1724   LABSPEC 1.025 12/05/2023 1724   PHURINE 6.5 12/05/2023 1724   GLUCOSEU NEGATIVE 12/05/2023 1724   HGBUR NEGATIVE 12/05/2023 1724   BILIRUBINUR NEGATIVE 12/05/2023 1724   KETONESUR NEGATIVE 12/05/2023  1724   PROTEINUR NEGATIVE 12/05/2023 1724   UROBILINOGEN 1.0 10/24/2011 0107   NITRITE NEGATIVE 12/05/2023 1724   LEUKOCYTESUR TRACE (A) 12/05/2023 1724   Sepsis Labs No results for input(s): "WBC" in the last 168 hours.  Invalid input(s): "PROCALCITONIN", "LACTICIDVEN" Microbiology Recent Results (from the past 240 hours)  MRSA Next Gen by PCR, Nasal     Status: None   Collection Time: 12/08/23 11:52 AM   Specimen: Nasal Mucosa; Nasal Swab  Result Value Ref Range Status   MRSA by PCR Next Gen NOT DETECTED NOT DETECTED Final    Comment: (NOTE) The GeneXpert MRSA Assay (FDA approved for NASAL specimens only), is one component of a comprehensive MRSA colonization surveillance program. It is not intended to diagnose MRSA infection nor to guide or monitor treatment for MRSA infections. Test performance is not FDA approved in patients less than 47 years old. Performed at [facility], [address] [place], [place] [address]   Time coordinating discharge: 25 minutes SIGNED: Lanae Boast, MD  [facility] 12/16/2023, 11:37 AM  If 7PM-7AM, please contact night-coverage [email or web address]

## 2023-12-18 ENCOUNTER — Other Ambulatory Visit: Payer: Self-pay

## 2023-12-18 ENCOUNTER — Emergency Department (HOSPITAL_COMMUNITY)
Admission: EM | Admit: 2023-12-18 | Discharge: 2023-12-18 | Disposition: A | Discharge status: Home / Self Care | Payer: Medicaid Other | Attending: Emergency Medicine | Admitting: Emergency Medicine

## 2023-12-18 ENCOUNTER — Emergency Department (HOSPITAL_COMMUNITY): Payer: Medicaid Other

## 2023-12-18 ENCOUNTER — Emergency Department (HOSPITAL_BASED_OUTPATIENT_CLINIC_OR_DEPARTMENT_OTHER): Payer: Medicaid Other

## 2023-12-18 ENCOUNTER — Encounter (HOSPITAL_COMMUNITY): Payer: Self-pay

## 2023-12-18 DIAGNOSIS — Z79899 Other long term (current) drug therapy: Secondary | ICD-10-CM | POA: Insufficient documentation

## 2023-12-18 DIAGNOSIS — L97919 Non-pressure chronic ulcer of unspecified part of right lower leg with unspecified severity: Secondary | ICD-10-CM | POA: Insufficient documentation

## 2023-12-18 DIAGNOSIS — L97929 Non-pressure chronic ulcer of unspecified part of left lower leg with unspecified severity: Secondary | ICD-10-CM | POA: Diagnosis not present

## 2023-12-18 DIAGNOSIS — R52 Pain, unspecified: Secondary | ICD-10-CM

## 2023-12-18 DIAGNOSIS — I10 Essential (primary) hypertension: Secondary | ICD-10-CM | POA: Diagnosis not present

## 2023-12-18 DIAGNOSIS — M7918 Myalgia, other site: Secondary | ICD-10-CM | POA: Diagnosis present

## 2023-12-18 DIAGNOSIS — I872 Venous insufficiency (chronic) (peripheral): Secondary | ICD-10-CM

## 2023-12-18 DIAGNOSIS — M7989 Other specified soft tissue disorders: Secondary | ICD-10-CM

## 2023-12-18 DIAGNOSIS — I83009 Varicose veins of unspecified lower extremity with ulcer of unspecified site: Secondary | ICD-10-CM | POA: Diagnosis not present

## 2023-12-18 LAB — CBC WITH DIFFERENTIAL/PLATELET
Abs Immature Granulocytes: 0.06 10*3/uL (ref 0.00–0.07)
Basophils Absolute: 0.1 10*3/uL (ref 0.0–0.1)
Basophils Relative: 1 %
Eosinophils Absolute: 0.3 10*3/uL (ref 0.0–0.5)
Eosinophils Relative: 3 %
HCT: 35 % — ABNORMAL LOW (ref 36.0–46.0)
Hemoglobin: 11.3 g/dL — ABNORMAL LOW (ref 12.0–15.0)
Immature Granulocytes: 1 %
Lymphocytes Relative: 16 %
Lymphs Abs: 1.9 10*3/uL (ref 0.7–4.0)
MCH: 32.3 pg (ref 26.0–34.0)
MCHC: 32.3 g/dL (ref 30.0–36.0)
MCV: 100 fL (ref 80.0–100.0)
Monocytes Absolute: 0.8 10*3/uL (ref 0.1–1.0)
Monocytes Relative: 7 %
Neutro Abs: 8.8 10*3/uL — ABNORMAL HIGH (ref 1.7–7.7)
Neutrophils Relative %: 72 %
Platelets: 329 10*3/uL (ref 150–400)
RBC: 3.5 MIL/uL — ABNORMAL LOW (ref 3.87–5.11)
RDW: 13 % (ref 11.5–15.5)
WBC: 12 10*3/uL — ABNORMAL HIGH (ref 4.0–10.5)
nRBC: 0 % (ref 0.0–0.2)

## 2023-12-18 LAB — BASIC METABOLIC PANEL
Anion gap: 11 (ref 5–15)
BUN: 7 mg/dL (ref 6–20)
CO2: 27 mmol/L (ref 22–32)
Calcium: 9.1 mg/dL (ref 8.9–10.3)
Chloride: 98 mmol/L (ref 98–111)
Creatinine, Ser: 0.73 mg/dL (ref 0.44–1.00)
GFR, Estimated: >60 mL/min (ref >60)
Glucose, Bld: 111 mg/dL — ABNORMAL HIGH (ref 70–99)
Potassium: 3.7 mmol/L (ref 3.5–5.1)
Sodium: 136 mmol/L (ref 135–145)

## 2023-12-18 LAB — I-STAT CG4 LACTIC ACID, ED: Lactic Acid, Venous: 0.9 mmol/L (ref 0.5–1.9)

## 2023-12-18 LAB — BRAIN NATRIURETIC PEPTIDE: B Natriuretic Peptide: 19.3 pg/mL (ref 0.0–100.0)

## 2023-12-18 MED ORDER — FUROSEMIDE 10 MG/ML IJ SOLN
40.0000 mg | Freq: Once | INTRAMUSCULAR | Status: AC
  Administered 2023-12-18: 40 mg via INTRAVENOUS
  Filled 2023-12-18: qty 4

## 2023-12-18 MED ORDER — KETOROLAC TROMETHAMINE 15 MG/ML IJ SOLN: 15.0000 mg | Freq: Once | INTRAMUSCULAR | Status: DC

## 2023-12-18 MED ORDER — ONDANSETRON HCL 4 MG/2ML IJ SOLN
4.0000 mg | Freq: Once | INTRAMUSCULAR | Status: AC
  Administered 2023-12-18: 4 mg via INTRAVENOUS
  Filled 2023-12-18: qty 2

## 2023-12-18 MED ORDER — HYDROCODONE-ACETAMINOPHEN 10-325 MG PO TABS
1.0000 | ORAL_TABLET | Freq: Once | ORAL | Status: AC
  Administered 2023-12-18: 1 via ORAL
  Filled 2023-12-18: qty 1

## 2023-12-18 MED ORDER — OXYCODONE-ACETAMINOPHEN 5-325 MG PO TABS
2.0000 | ORAL_TABLET | Freq: Once | ORAL | Status: AC
  Administered 2023-12-18: 2 via ORAL
  Filled 2023-12-18: qty 2

## 2023-12-18 NOTE — ED Notes (Signed)
Patient transported to vascular.

## 2023-12-18 NOTE — Progress Notes (Signed)
VASCULAR LAB    Left lower extremity venous duplex has been performed.  See CV proc for preliminary results.  Messaged results to Dr. Anitra Lauth and Riki Sheer, PA-C  [facility], [facility] 12/18/2023, 2:59 PM

## 2023-12-18 NOTE — ED Provider Notes (Incomplete Revision)
[place] EMERGENCY DEPARTMENT AT [facility] Provider Note   CSN: [id number] Arrival date & time: 12/18/23  1219     History  Chief Complaint  Patient presents with   Generalized Body Aches   Hypertension   HPI Sierra Atkins is a 58 y.o. female with history of hypertension, lupus and recent history of cellulitis to the left lower extremity, chronic opiated use presenting for generalized bodyaches and hypertension.  States she was discharged from the hospital 2 days ago for treatment for cellulitis.  Since then she has had generalized body pain.  She states that the worst of the pain is in both her shoulders but radiates down both arms through the chest and abdomen and back along with both legs.  States she has been taking 'Perc 10s' for pain but has run out.  Also states that during physical therapy today her physical therapist noted that her blood pressure was high which was concerning.  Prompting her to come here to be evaluated in the ED.  States at this time she is not currently taking anything for her hypertension as well.  Also states that the swelling and pain in both of her lower legs is worse and she is short of breath but no chest pain.  Was sent home with a few tablets of Percocet for peripheral neuropathy.    Hypertension       Home Medications Prior to Admission medications   Medication Sig Start Date End Date Taking? Authorizing Provider  diclofenac Sodium (VOLTAREN) 1 % GEL Apply 1 Application topically as needed (pain). 04/16/23   [provider]  fluticasone (FLONASE) 50 MCG/ACT nasal spray Place 2 sprays into both nostrils daily for 7 days. Patient taking differently: Place 1-2 sprays into both nostrils daily as needed for allergies. 06/29/23 12/06/23  Arabella Merles, PA-C  furosemide (LASIX) 40 MG tablet Take 40 mg by mouth 2 (two) times daily. 08/15/23   [provider]  gabapentin (NEURONTIN) 300 MG capsule Take 300 mg by mouth 3  (three) times daily. 12/21/22   [provider]  HYDROcodone-acetaminophen (NORCO) 10-325 MG tablet Take 1 tablet by mouth every 6 (six) hours as needed. Patient not taking: Reported on 12/06/2023    [provider]  hydroxychloroquine (PLAQUENIL) 200 MG tablet Take 200 mg by mouth daily.    [provider]  methocarbamol (ROBAXIN) 500 MG tablet Take 500 mg by mouth as needed for muscle spasms. 08/06/23   [provider]  methotrexate (RHEUMATREX) 2.5 MG tablet Take 15 mg by mouth once a week. 04/23/23   [provider]  Multiple Vitamin (MULTIVITAMIN) capsule Take 1 capsule by mouth daily.      [provider]  Omega-3 Fatty Acids (FISH OIL) 1000 MG CAPS Take 1,000 mg by mouth daily.    [provider]  Oxycodone HCl 10 MG TABS Take 1 tablet (10 mg total) by mouth every 6 (six) hours as needed for up to 10 doses (pain). 12/16/23   Lanae Boast, MD  potassium chloride SA (KLOR-CON M) 20 MEQ tablet Take 1 tablet (20 mEq total) by mouth daily for 6 days. Patient not taking: Reported on 11/22/2023 08/13/22 08/19/22  Gailen Shelter, PA  spironolactone (ALDACTONE) 25 MG tablet Take 1 tablet by mouth daily. 08/15/23   [provider]  zolpidem (AMBIEN) 10 MG tablet Take 1 tablet (10 mg total) by mouth at bedtime for 7 days. 12/16/23 12/23/23  Lanae Boast, MD  Allergies    Fentanyl, Codeine, and Morphine and codeine    Review of Systems   See HPI for pertinent positives  Physical Exam Updated Vital Signs BP (!) 154/108 (BP Location: Left Arm)   Pulse 87   Temp 97.8 F (36.6 C)   Resp 16   SpO2 100%  Physical Exam Vitals and nursing note reviewed.  HENT:     Head: Normocephalic and atraumatic.     Mouth/Throat:     Mouth: Mucous membranes are moist.  Eyes:     General:        Right eye: No discharge.        Left eye: No discharge.     Conjunctiva/sclera: Conjunctivae normal.  Cardiovascular:     Rate and Rhythm: Normal  rate and regular rhythm.     Pulses: Normal pulses.     Heart sounds: Normal heart sounds.  Pulmonary:     Effort: Pulmonary effort is normal.     Breath sounds: Normal breath sounds.  Abdominal:     General: Abdomen is flat.     Palpations: Abdomen is soft.  Skin:    General: Skin is warm and dry.  Neurological:     General: No focal deficit present.  Psychiatric:        Mood and Affect: Mood normal.     ED Results / Procedures / Treatments   Labs (all labs ordered are listed, but only abnormal results are displayed) Labs Reviewed - No data to display  EKG None  Radiology No results found.  Procedures Procedures    Medications Ordered in ED Medications  HYDROcodone-acetaminophen (NORCO) 10-325 MG per tablet 1 tablet (1 tablet Oral Given 12/18/23 1329)    ED Course/ Medical Decision Making/ A&P                                 Medical Decision Making Risk Prescription drug management.   58 year old well-appearing female presenting for generalized bodyaches and concern for her blood pressure.  Exam was unremarkable.  Symptoms appear to be nonfocal.  I did review her discharge summary from 2 days ago.  Today.  She does have a history of chronic opioid use.  Was sent with home with a few tablets of prior 10 for peripheral neuropathy but was advised to follow-up with her PCP her pain return.  Blood pressure is somewhat elevated but no suggestions of hypertensive emergency at this time.  Patient was also sent home 2 days ago on Lasix which was deemed adequate treatment at discharge.  Suspect her symptoms are chronic in nature.  Advised her to follow-up with her PCP.  Discussed return precautions.  Discharged good condition.        Final Clinical Impression(s) / ED Diagnoses Final diagnoses:  Generalized body aches    Rx / DC Orders ED Discharge Orders     None         Gareth Eagle, PA-C 12/18/23 1344

## 2023-12-18 NOTE — ED Provider Notes (Cosign Needed Addendum)
[place] EMERGENCY DEPARTMENT AT [facility] Provider Note   CSN: [id number] Arrival date & time: 12/18/23  1219     History  Chief Complaint  Patient presents with   Generalized Body Aches   Hypertension   HPI Sierra Atkins is a 58 y.o. female with history of hypertension, lupus and recent history of cellulitis to the left lower extremity, chronic opiated use presenting for generalized bodyaches and hypertension.  States she was discharged from the hospital 2 days ago for treatment for cellulitis.  Since then she has had generalized body pain.  She states that the worst of the pain is in both her shoulders but radiates down both arms through the chest and abdomen and back along with both legs.  States she has been taking 'Perc 10s' for pain but has run out.  Also states that during physical therapy today her physical therapist noted that her blood pressure was high which was concerning.  Prompting her to come here to be evaluated in the ED.  States at this time she is not currently taking anything for her hypertension as well.  States the swelling in her left leg has improved since her admission.  Was sent home with a few tablets of Percocet for peripheral neuropathy.    Hypertension       Home Medications Prior to Admission medications   Medication Sig Start Date End Date Taking? Authorizing Provider  diclofenac Sodium (VOLTAREN) 1 % GEL Apply 1 Application topically as needed (pain). 04/16/23   [provider]  fluticasone (FLONASE) 50 MCG/ACT nasal spray Place 2 sprays into both nostrils daily for 7 days. Patient taking differently: Place 1-2 sprays into both nostrils daily as needed for allergies. 06/29/23 12/06/23  Arabella Merles, PA-C  furosemide (LASIX) 40 MG tablet Take 40 mg by mouth 2 (two) times daily. 08/15/23   [provider]  gabapentin (NEURONTIN) 300 MG capsule Take 300 mg by mouth 3 (three) times daily. 12/21/22   [provider]  HYDROcodone-acetaminophen (NORCO) 10-325 MG tablet Take 1 tablet by mouth every 6 (six) hours as needed. Patient not taking: Reported on 12/06/2023    [provider]  hydroxychloroquine (PLAQUENIL) 200 MG tablet Take 200 mg by mouth daily.    [provider]  methocarbamol (ROBAXIN) 500 MG tablet Take 500 mg by mouth as needed for muscle spasms. 08/06/23   [provider]  methotrexate (RHEUMATREX) 2.5 MG tablet Take 15 mg by mouth once a week. 04/23/23   [provider]  Multiple Vitamin (MULTIVITAMIN) capsule Take 1 capsule by mouth daily.      [provider]  Omega-3 Fatty Acids (FISH OIL) 1000 MG CAPS Take 1,000 mg by mouth daily.    [provider]  Oxycodone HCl 10 MG TABS Take 1 tablet (10 mg total) by mouth every 6 (six) hours as needed for up to 10 doses (pain). 12/16/23   Lanae Boast, MD  potassium chloride SA (KLOR-CON M) 20 MEQ tablet Take 1 tablet (20 mEq total) by mouth daily for 6 days. Patient not taking: Reported on 11/22/2023 08/13/22 08/19/22  Gailen Shelter, PA  spironolactone (ALDACTONE) 25 MG tablet Take 1 tablet by mouth daily. 08/15/23   [provider]  zolpidem (AMBIEN) 10 MG tablet Take 1 tablet (10 mg total) by mouth at bedtime for 7 days. 12/16/23 12/23/23  Lanae Boast, MD      Allergies    Fentanyl, Codeine, and Morphine and codeine  Review of Systems   See HPI for pertinent positives  Physical Exam Updated Vital Signs BP (!) 154/108 (BP Location: Left Arm)   Pulse 87   Temp 97.8 F (36.6 C)   Resp 16   SpO2 100%  Physical Exam Vitals and nursing note reviewed.  HENT:     Head: Normocephalic and atraumatic.     Mouth/Throat:     Mouth: Mucous membranes are moist.  Eyes:     General:        Right eye: No discharge.        Left eye: No discharge.     Conjunctiva/sclera: Conjunctivae normal.  Cardiovascular:     Rate and Rhythm: Normal rate and regular rhythm.     Pulses: Normal pulses.      Heart sounds: Normal heart sounds.  Pulmonary:     Effort: Pulmonary effort is normal.     Breath sounds: Normal breath sounds.  Abdominal:     General: Abdomen is flat.     Palpations: Abdomen is soft.  Skin:    General: Skin is warm and dry.  Neurological:     General: No focal deficit present.  Psychiatric:        Mood and Affect: Mood normal.     ED Results / Procedures / Treatments   Labs (all labs ordered are listed, but only abnormal results are displayed) Labs Reviewed - No data to display  EKG None  Radiology No results found.  Procedures Procedures    Medications Ordered in ED Medications  HYDROcodone-acetaminophen (NORCO) 10-325 MG per tablet 1 tablet (1 tablet Oral Given 12/18/23 1329)    ED Course/ Medical Decision Making/ A&P                                 Medical Decision Making  58 year old well-appearing female presenting for generalized bodyaches and concern for her blood pressure.  Exam was unremarkable.  Symptoms appear to be nonfocal.  I did review her discharge summary from 2 days ago.  Today.  She does have a history of chronic opioid use.  Was sent with home with a few tablets of prior 10 for peripheral neuropathy but was advised to follow-up with her PCP her pain return.  Blood pressure is somewhat elevated but no suggestions of hypertensive emergency at this time.  Patient was also sent home 2 days ago on Lasix which was deemed adequate treatment at discharge.  Suspect her symptoms are chronic in nature.  Advised her to follow-up with her PCP.  Discussed return precautions.  Discharged good condition.        Final Clinical Impression(s) / ED Diagnoses Final diagnoses:  Generalized body aches    Rx / DC Orders ED Discharge Orders     None         Gareth Eagle, PA-C 12/18/23 1344

## 2023-12-18 NOTE — ED Notes (Signed)
Patient to X-Ray

## 2023-12-18 NOTE — Discharge Instructions (Addendum)
Evaluation today was overall reassuring.   It appears that you have venous stasis dermatitis and acute on chronic pain.  It does not appear that you have any emergency cause of your issues.  Please contact your primary care doctor for management of your chronic health issues.  It does not appear that you need readmission.  You may need improved pain control.  Please contact your pain management physician. I recommend that you continue taking your Lasix and follow-up with your PCP for your blood pressure.  There is your generalized body pain recommend Tylenol ibuprofen.    Get help right away if: Your bone or joint under the affected area becomes painful after the skin has healed. You feel a deep pain in your leg or groin. You are short of breath.

## 2023-12-18 NOTE — ED Notes (Signed)
Pt has 2+ edema of LE bilat, legs are hot to touch, they're erythematous. Pt has 1+ pedal pulse bilat, cap refill less than 3 sec.

## 2023-12-18 NOTE — ED Provider Notes (Signed)
  Physical Exam  BP (!) 155/94   Pulse 87   Temp 98.6 F (37 C) (Oral)   Resp 20   Ht 5\' 4"  (1.626 m)   Wt (!) 150.2 kg   SpO2 100%   BMI 56.84 kg/m   Physical Exam On physical examination patient has bilateral lower extremity edema with what appears to be bilateral stasis dermatitis.  There is no evidence of cellulitis. Procedures  Procedures  ED Course / MDM   Clinical Course as of 12/18/23 1621  Sat Dec 18, 2023  1508 Here with  [AH]    Clinical Course User Index [AH] Arthor Captain, PA-C   Medical Decision Making Amount and/or Complexity of Data Reviewed Labs: ordered. Radiology: ordered.  Risk Prescription drug management.   I reviewed the patient's lab.  Although her white blood cell count is up slightly.  I think this is likely acute phase reaction in setting of acute on chronic pain.  Her DVT study is negative.  Her labs are otherwise reassuring.  Chest x-ray and BNP are within normal limits . she has equal bilateral mildly erythematous swelling and this appears to be venous stasis dermatitis.  Patient will be discharged to follow closely with her PCP.  She may benefit from [employer] and home health.  She states she already has home health workers coming to her house.  Encouraged her to continue to use compression stockings at home.  She appears otherwise appropriate for discharge at this time.       Arthor Captain, PA-C 12/20/23 1700    Elayne Snare K, DO 12/22/23 1557

## 2023-12-18 NOTE — ED Triage Notes (Signed)
Patient from home via EMS. She was recently discharged from [facility] earlier this month and still complaining of generalized body pain and htn. Has been off her htn meds for a few days and has run out of her oxycodone 10mg .

## 2024-08-11 NOTE — Discharge Summary (Signed)
 Urine to meniscectomy when I was really there is you saw the rash and come [facility] Discharge Summary  PCP: Evalene Prescott, NP Discharge Details   Admit date:         08/05/2024 Discharge date:         08/11/2024 Hospital LOS:    6 days  Active Hospital Problems   Diagnosis Date Noted POA  . *Fluid overload 08/05/2024 Yes  . Chronic venous insufficiency 06/29/2024 Yes  . Pain syndrome, chronic 11/15/2023 Yes  . Rheumatoid arthritis (*) 06/01/2023 Yes  . Essential hypertension 06/01/2023 Yes  . Asthma (*) 08/29/2009 Yes  . Lupus erythematosus 08/28/2009 Yes    Resolved Hospital Problems  No resolved problems to display.      Current Discharge Medication List     START taking these medications      Details  cephALEXin  500 mg capsule Commonly known as: KEFLEX   Take one capsule (500 mg dose) by mouth every 6 (six) hours for 7 days. Indication: Infection of the Skin and/or Skin Structures Quantity: 28 capsule   traZODone 50 mg tablet Commonly known as: DESYREL  Take one tablet (50 mg dose) by mouth at bedtime as needed for Sleep. Quantity: 30 tablet       CONTINUE these medications which have CHANGED      Details  * diclofenac  sodium 1 % gel Commonly known as: VOLTAREN  What changed: Another medication with the same name was added. Make sure you understand how and when to take each.  Apply topically 4 (four) times a day as needed. Quantity: 100 g   * diclofenac  sodium 1 % gel Commonly known as: VOLTAREN  What changed: You were already taking a medication with the same name, and this prescription was added. Make sure you understand how and when to take each.  Apply four g topically 4 (four) times a day as needed (to affected areas). Quantity: 350 g   DULoxetine HCl 60 mg capsule Commonly known as: CYMBALTA What changed:  medication strength how much to take when to take this additional instructions  Take one capsule (60 mg dose) by  mouth 2 (two) times daily. Quantity: 60 capsule   furosemide  80 mg tablet Commonly known as: LASIX  What changed:  medication strength how much to take when to take this  Take one tablet (80 mg dose) by mouth daily. Quantity: 30 tablet   gabapentin  300 mg capsule Commonly known as: NEURONTIN  What changed: Another medication with the same name was removed. Continue taking this medication, and follow the directions you see here.  Take one capsule (300 mg dose) by mouth 3 (three) times a day.   oxyCODONE -acetaminophen  10-325 mg per tablet Commonly known as: PERCOCET,ENDOCET What changed:  when to take this Another medication with the same name was removed. Continue taking this medication, and follow the directions you see here.  Take one tablet by mouth every 6 (six) hours as needed for Pain for up to 30 days. Max Daily Amount: 4 tablets Quantity: 90 tablet   predniSONE  20 mg tablet Commonly known as: DELTASONE  Start taking on: August 09, 2024 What changed:  medication strength See the new instructions.  Take two tablets (40 mg dose) by mouth daily for 2 days, THEN one and a half tablets (30 mg dose) daily for 3 days, THEN one tablet (20 mg dose) daily for 3 days, THEN one half tablet (10 mg dose) daily for 3 days. Quantity: 13 tablet   zolpidem  tartrate  10 mg tablet Commonly known as: AMBIEN  What changed: when to take this  TAKE ONE TABLET (10 MG DOSE) BY MOUTH AT BEDTIME AS NEEDED FOR SLEEP. MAX DAILY AMOUNT: 10 MG Quantity: 15 tablet      * * This list has 2 medication(s) that are the same as other medications prescribed for you. Read the directions carefully, and ask your doctor or other care provider to review them with you.          CONTINUE these medications which have NOT CHANGED      Details  albuterol sulfate HFA 108 (90 Base) MCG/ACT inhaler Commonly known as: PROVENTIL,VENTOLIN,PROAIR  Inhale one puff to two puffs into the lungs every 6 (six) hours as  needed for Wheezing. Quantity: 18 g   BENADRYL  ALLERGY 25 mg capsule Generic drug: diphenhydrAMINE   Take 1 capsule every 4 hours by oral route.   DULERA 100-5 MCG/ACT Aero inhaler Generic drug: mometasone furoate & formoterol fumarate  Inhale two puffs into the lungs 2 (two) times daily. Quantity: 13 g   fluticasone  propionate 50 mcg/actuation nasal spray Commonly known as: FLONASE   two sprays by Both Nostrils route daily. Quantity: 16 g   naloxone  nasal spray (TAKE HOME PACK) Commonly known as: NARCAN   one spray by Intranasal route once as needed for Opioid Reversal. Quantity: 1 each   spironolactone  50 mg tablet Commonly known as: ALDACTONE   Take one tablet (50 mg dose) by mouth daily. Quantity: 90 tablet      * You might also be taking other medications not listed above. If you have questions about any of your other medications, talk to the person who prescribed them or your Primary Care Provider.          STOP taking these medications    fish oil 1000 mg Caps Commonly known as: SEA-OMEGA   hydroxychloroquine  200 MG tablet Commonly known as: PLAQUENIL    ibuprofen 800 mg tablet Commonly known as: ADVIL,MOTRIN   methocarbamol  500 mg tablet Commonly known as: ROBAXIN    methotrexate  2.5 mg tablet   simethicone 80 mg chewable tablet Commonly known as: MI-ACID,GAS-X       Hospital Course   Indication for Admission: Fluid overload  Hospital Course:       This is a 58 year old female with a history of severe arthritis who presented with shortness of breath and bilateral leg swelling.  Patient was admitted and diuresed.  Her proBNP was only 258 and echocardiogram was unremarkable.  We continued home oxycodone  but patient asked daily for IV pain medicine.  On the final day of hospitalization we stopped IV pain medicine supplementation.  We have added Voltaren  as well as steroids.  Will continue oxycodone  but patient needs to follow-up with orthopedics as well as  rheumatology for her severe arthritic condition.  She is discharged to short-term rehab facility to build her strength.  Most notably patient is morbidly obese with a BMI of 53.  She desperately needs to lose 200 pounds and will recommend her pursuing that by any means possible.  Addendum: No events overnight.  Pain seems to be controlled.  She is waiting for discharge to go to facility for ongoing therapy.  Exam is unchanged.  Addendum #2: No events overnight.  Pain seems controlled and patient is ready for discharge to facility.  Exam is unchanged.   Vital signs in last 24 hours: Intake/Output:  Patient Vitals for the past 24 hrs:  BP Temp Temp src Pulse Resp SpO2 Weight  08/11/24  0722 146/87 98 F (36.7 C) Oral 64 19 96 % --  08/11/24 0353 (!) 164/97 98.4 F (36.9 C) Axillary 78 17 96 % (!) 152.1 kg (335 lb 5.1 oz)  08/10/24 1936 128/74 98.8 F (37.1 C) Oral 75 18 95 % --  08/10/24 1534 140/71 98.2 F (36.8 C) Oral 67 -- 94 % --  08/10/24 1108 133/66 97.7 F (36.5 C) Oral 73 19 97 % --     Wt Readings from Last 1 Encounters:  08/11/24 (!) 152.1 kg (335 lb 5.1 oz)   Body mass index is 52.52 kg/m.  Intake/Output Summary (Last 24 hours) at 08/11/2024 0836 Last data filed at 08/11/2024 0255 Gross per 24 hour  Intake 1040 ml  Output 1800 ml  Net -760 ml     Physical Exam  General:  No acute distress  Neck:   Supple with good range of motion, no lymphadenopathy  Skin:   Normal color, warm, no rash noted  Respiratory:   Clear to auscultation, no wheezing noted,  no accessory muscle use noted,  no decreased breath sounds noted  Cardiovascular:    Normal rate, normal S1, S2, no S3,  no S4, no murmur noted, no pedal edema noted  Gastrointestinal:   Normal bowel sounds, soft, no tenderness noted, no distention noted, no hepatomegaly noted, no splenomegaly noted  Neurologic:   Follows commands, no focal motor loss noted   Psychiatric:   Alert, oriented to person, place and  time, normal mood, normal affect     Bedside Procedures   No orders found     Labs on Discharge:  Recent Labs    Units 08/06/24 0659 08/05/24 1455  WBC thou/mcL 8.1 10.5  HGB gm/dL 88.2 86.7  HCT % 63.4 59.0  PLT thou/mcL 285 331    Recent Labs    Units 08/08/24 0443 08/07/24 0550 08/06/24 0659 08/05/24 1455  NA mmol/L 134* 139 140 142  K mmol/L 4.5 4.1 3.7 3.7  CL mmol/L 98 102 105 105  CO2 mmol/L 26 27 24 23   BUN mg/dL 18 19 14 12   CREATININE mg/dL 9.34 9.30 9.21 9.33  CALCIUM mg/dL 9.1 8.7 8.8 9.0   Recent Labs    Units 08/05/24 1455  TSH mcIU/mL 1.06   Recent Labs    Units 08/05/24 1455  BILITOT mg/dL 0.2  AST U/L 13  ALT U/L 10  ALKPHOS U/L 107  ALBUMIN gm/dL 4.0   No results for input(s): LABPROT, INR, PTT in the last 168 hours.  No results for input(s): CHOL, LDL, HDL, TRIG in the last 168 hours.  Diagnostics:    [facility]     Recommendations to physicians: none  Potential for Rehab:        Fair  Long Term prognosis:        Fair  Disposition:     Rehab facility  Code Status:   Full Code    Time spent in discharge process:  .  Documentation completed with the use of voice recognition technology which may inadvertently result in erroneous dictation and/or typographical errors. Please utilize context clues or correctness and if needed contact author for any questions/concerns related to documentation.   Electronically signed: Donnice SHAUNNA Minerva, MD 08/11/2024 / 8:36 AM *Some images could not be shown.

## 2024-10-10 ENCOUNTER — Emergency Department (HOSPITAL_COMMUNITY)

## 2024-10-10 ENCOUNTER — Emergency Department (HOSPITAL_COMMUNITY)
Admission: EM | Admit: 2024-10-10 | Discharge: 2024-10-11 | Disposition: A | Discharge status: Home / Self Care | Attending: Emergency Medicine | Admitting: Emergency Medicine

## 2024-10-10 DIAGNOSIS — R0989 Other specified symptoms and signs involving the circulatory and respiratory systems: Secondary | ICD-10-CM | POA: Insufficient documentation

## 2024-10-10 DIAGNOSIS — Y9301 Activity, walking, marching and hiking: Secondary | ICD-10-CM | POA: Insufficient documentation

## 2024-10-10 DIAGNOSIS — W1839XA Other fall on same level, initial encounter: Secondary | ICD-10-CM | POA: Insufficient documentation

## 2024-10-10 DIAGNOSIS — Y92008 Other place in unspecified non-institutional (private) residence as the place of occurrence of the external cause: Secondary | ICD-10-CM | POA: Insufficient documentation

## 2024-10-10 DIAGNOSIS — W19XXXA Unspecified fall, initial encounter: Secondary | ICD-10-CM

## 2024-10-10 LAB — CBC
HCT: 43.3 % (ref 36.0–46.0)
Hemoglobin: 13.6 g/dL (ref 12.0–15.0)
MCH: 31.6 pg (ref 26.0–34.0)
MCHC: 31.4 g/dL (ref 30.0–36.0)
MCV: 100.5 fL — ABNORMAL HIGH (ref 80.0–100.0)
Platelets: 333 K/uL (ref 150–400)
RBC: 4.31 MIL/uL (ref 3.87–5.11)
RDW: 13.7 % (ref 11.5–15.5)
WBC: 12.5 K/uL — ABNORMAL HIGH (ref 4.0–10.5)
nRBC: 0 % (ref 0.0–0.2)

## 2024-10-10 LAB — BASIC METABOLIC PANEL WITH GFR
Anion gap: 13 (ref 5–15)
BUN: 10 mg/dL (ref 6–20)
CO2: 26 mmol/L (ref 22–32)
Calcium: 9.7 mg/dL (ref 8.9–10.3)
Chloride: 104 mmol/L (ref 98–111)
Creatinine, Ser: 0.88 mg/dL (ref 0.44–1.00)
GFR, Estimated: >60 mL/min (ref >60)
Glucose, Bld: 118 mg/dL — ABNORMAL HIGH (ref 70–99)
Potassium: 3.9 mmol/L (ref 3.5–5.1)
Sodium: 143 mmol/L (ref 135–145)

## 2024-10-10 LAB — RESP PANEL BY RT-PCR (RSV, FLU A&B, COVID)  RVPGX2
Influenza A by PCR: NEGATIVE
Influenza B by PCR: NEGATIVE
Resp Syncytial Virus by PCR: NEGATIVE
SARS Coronavirus 2 by RT PCR: NEGATIVE

## 2024-10-10 MED ORDER — HYDROMORPHONE HCL 1 MG/ML IJ SOLN
1.0000 mg | Freq: Once | INTRAMUSCULAR | Status: AC
Start: 2024-10-10 — End: 2024-10-10
  Administered 2024-10-10: 1 mg via SUBCUTANEOUS
  Filled 2024-10-10 (×2): qty 1

## 2024-10-10 MED ORDER — SPIRONOLACTONE 25 MG PO TABS
25.0000 mg | ORAL_TABLET | Freq: Every day | ORAL | Status: DC
  Filled 2024-10-10: qty 1

## 2024-10-10 MED ORDER — SPIRONOLACTONE 25 MG PO TABS
25.0000 mg | ORAL_TABLET | Freq: Every day | ORAL | Status: DC
  Administered 2024-10-10 – 2024-10-11 (×2): 25 mg via ORAL
  Filled 2024-10-10 (×2): qty 1

## 2024-10-10 MED ORDER — OXYCODONE-ACETAMINOPHEN 5-325 MG PO TABS: 1.0000 | ORAL_TABLET | Freq: Three times a day (TID) | ORAL | 0 refills | Status: DC | PRN

## 2024-10-10 MED ORDER — OXYCODONE-ACETAMINOPHEN 5-325 MG PO TABS
1.0000 | ORAL_TABLET | Freq: Once | ORAL | Status: AC
  Administered 2024-10-10: 1 via ORAL
  Filled 2024-10-10: qty 1

## 2024-10-10 MED ORDER — FUROSEMIDE 40 MG PO TABS
40.0000 mg | ORAL_TABLET | Freq: Once | ORAL | Status: AC
  Administered 2024-10-10: 40 mg via ORAL
  Filled 2024-10-10: qty 1

## 2024-10-10 MED ORDER — FUROSEMIDE 40 MG PO TABS
40.0000 mg | ORAL_TABLET | Freq: Two times a day (BID) | ORAL | Status: DC
  Administered 2024-10-11: 40 mg via ORAL
  Filled 2024-10-10 (×2): qty 1

## 2024-10-10 MED ORDER — OXYCODONE HCL 5 MG PO TABS
10.0000 mg | ORAL_TABLET | Freq: Once | ORAL | Status: DC
Start: 2024-10-10 — End: 2024-10-10
  Filled 2024-10-10: qty 2

## 2024-10-10 NOTE — Discharge Instructions (Signed)
 You have been seen and discharged from the emergency department.  Take pain medicine as prescribed.  Do not mix this medication with alcohol or other sedating medications. Do not drive or do heavy physical activity until you know how this medication affects you.  It may cause drowsiness.  Follow-up with your primary provider for further evaluation and further care. Take home medications as prescribed. If you have any worsening symptoms or further concerns for your health please return to an emergency department for further evaluation.

## 2024-10-10 NOTE — ED Notes (Signed)
 Asked patient about discharge. Patient stated she did not have a place to go, that she had checked out of a hotel to come to the ED. Pt's daughter is in the hospital and the family who has her rollator is at work overnight in [place] and cannot bring it until the morning. Pt is unable to safely ambulate without her rollator and does not at this point have a residence to go back to. TOC consult placed and are assisting with safe discharge.

## 2024-10-10 NOTE — ED Provider Triage Note (Signed)
 Emergency Medicine Provider Triage Evaluation Note  Chenoa Luddy , a 58 y.o. female  was evaluated in triage.  Pt complains of shortness of breath and productive cough ongoing for about 2 weeks.  She reports she felt weak and had a fall today, she has chronic pain in her hips but is flared up since her fall.  She is also having chest pain and my whole body hurts.  She does suffer from generalized body pain.  She does take oxycodone  at home for pain  Review of Systems  Positive: Cough, myalgias Negative: Loss of consciousness  Physical Exam  BP (!) 202/87 (BP Location: Left Arm)   Pulse 73   Temp 97.9 F (36.6 C) (Oral)   Resp 16   SpO2 98%  Gen:   Awake, no distress, laryngitis, hoarse voice Resp:  Normal effort MSK:   Patient has generalized body pain anywhere touch on the back  Medical Decision Making  Medically screening exam initiated at 10:47 AM.  Appropriate orders placed.  Faithlyn Recktenwald was informed that the remainder of the evaluation will be completed by another provider, this initial triage assessment does not replace that evaluation, and the importance of remaining in the ED until their evaluation is complete.  Productive cough, hoarse voice, possible viral syndrome versus superimposed infection or pneumonia.  X-ray imaging and blood tests have been ordered.  Generalized body pain after a fall, acute on chronic.  Oral home pain medicine ordered with oxycodone  as well as x-ray of the hip.  No head trauma.   Cottie Donnice PARAS, MD 10/10/24 1050

## 2024-10-10 NOTE — Progress Notes (Addendum)
 ICM consulted to assist with rollator and shelter. Per pt, family is in the hospital. ICM able to provide shelter resources. RNCM to assist with obtaining rollator.   Addend @ 5:45PM Per RN, pt reports not being able to ambulate without assistance and assistive device-- RN reports observing and could see how ambulation is limited without support and expressed concerns about pt falling and returning.  RNCM spoke with pt who will have her daughters friend bring her walker on tomorrow. CSW inquired about it being able to be brought to ED tonight. If no one is able to drop off walker in ED, pt will board until tomorrow when the walker is brought and she can safely discharge. According to RN, the person who may be able to bring the walker works overnight and is also not in town.   Addend @ 6:08PM RNCM spoke with daughter who reported pt was just discharged from [facility] last week. CSW spoke with pt to confirm - pt stated she was admitted to the hospital on 9/13, hospitalized for 7 days, and admitted to [facility] for rehab all the way until last week. Per pt I discharged to soon. I was ready to go home. [facility] is not what people say it is. I didn't know the rating were that bad. CSW informed she'd reach out to [facility] in the morning to obtain information about whether there will be any financial obligation to her due to her recent STR stay. CSW also informed pt that there are very few SNFs in network with her managed Medicaid. Pt verbalized understanding and asked CSW about good facilities. Per pt- she just began receiving disability. CSW advised she is unable to make recommendations and informed pt of [email or web address] Pt thanked this clinical research associate. Care team updated via secure chat.

## 2024-10-10 NOTE — Progress Notes (Signed)
 TOC/ICM consulted for DME/walker. CM spoke c/pt on phone. Pt said she dc'd from [facility] last week and has a walker. Pt's daughter, who normally can assist, is in the hospital. A friend of her daughter's will bring pt her walker tomorrow. Pt plans to dc to a hotel.   10/11/2024: Pt held overnight in the [facility] ED. Discharged today.

## 2024-10-10 NOTE — ED Triage Notes (Signed)
 Pt arrives via [facility] from home for a fall. Pt has a caretaker that called this morning and pt did not answer, so a welfare check was performed. Pt was found in the floor. She states she thinks she fell around 0430, but has little recollection of the events leading up to fall. C/o back, hip and leg pain which she states is chronic. EMS reports several empty liquor bottles in the room with pt, but she states she did not have any alcohol yesterday.

## 2024-10-10 NOTE — ED Provider Notes (Signed)
 [place] EMERGENCY DEPARTMENT AT [facility] Provider Note   CSN: [id number] Arrival date & time: 10/10/24  9048     Patient presents with: Sierra Atkins   Laycie Schriner is a 58 y.o. female.   HPI   58 year old female presents emergency department with concern for upper respiratory congestion and a mechanical fall.  She is now complaining of whole body pain which is acute on chronic.  She was walking in her living room when she lost her balance and fell down onto her bottom.  No head injury or LOC.  No syncope.  She states that she has recently been around sick people have, has confirmed that she has the flu.  Denies any specific isolated chest pain, back pain.  Has been ambulatory since.  Prior to Admission medications   Medication Sig Start Date End Date Taking? Authorizing Provider  oxyCODONE -acetaminophen  (PERCOCET/ROXICET) 5-325 MG tablet Take 1 tablet by mouth every 8 (eight) hours as needed for severe pain (pain score 7-10). 10/10/24  Yes Eisa Necaise M, DO  diclofenac  Sodium (VOLTAREN ) 1 % GEL Apply 1 Application topically as needed (pain). 04/16/23   [provider]  fluticasone  (FLONASE ) 50 MCG/ACT nasal spray Place 2 sprays into both nostrils daily for 7 days. Patient taking differently: Place 1-2 sprays into both nostrils daily as needed for allergies. 06/29/23 12/06/23  Veta Palma, PA-C  furosemide  (LASIX ) 40 MG tablet Take 40 mg by mouth 2 (two) times daily. 08/15/23   [provider]  gabapentin  (NEURONTIN ) 300 MG capsule Take 300 mg by mouth 3 (three) times daily. 12/21/22   [provider]  HYDROcodone -acetaminophen  (NORCO) 10-325 MG tablet Take 1 tablet by mouth every 6 (six) hours as needed. Patient not taking: Reported on 12/06/2023    [provider]  hydroxychloroquine  (PLAQUENIL ) 200 MG tablet Take 200 mg by mouth daily.    [provider]  methocarbamol  (ROBAXIN ) 500 MG tablet Take 500 mg by mouth as needed  for muscle spasms. 08/06/23   [provider]  methotrexate  (RHEUMATREX) 2.5 MG tablet Take 15 mg by mouth once a week. 04/23/23   [provider]  Multiple Vitamin (MULTIVITAMIN) capsule Take 1 capsule by mouth daily.      [provider]  Omega-3 Fatty Acids (FISH OIL) 1000 MG CAPS Take 1,000 mg by mouth daily.    [provider]  Oxycodone  HCl 10 MG TABS Take 1 tablet (10 mg total) by mouth every 6 (six) hours as needed for up to 10 doses (pain). 12/16/23   Christobal Guadalajara, MD  potassium chloride  SA (KLOR-CON  M) 20 MEQ tablet Take 1 tablet (20 mEq total) by mouth daily for 6 days. Patient not taking: Reported on 11/22/2023 08/13/22 08/19/22  Neldon Hamp RAMAN, PA  spironolactone  (ALDACTONE ) 25 MG tablet Take 1 tablet by mouth daily. 08/15/23   [provider]  zolpidem  (AMBIEN ) 10 MG tablet Take 1 tablet (10 mg total) by mouth at bedtime for 7 days. 12/16/23 12/23/23  Christobal Guadalajara, MD    Allergies: Fentanyl, Codeine, and Morphine and codeine    Review of Systems  Constitutional:  Positive for fatigue. Negative for fever.  HENT:  Positive for congestion.   Respiratory:  Negative for shortness of breath.   Cardiovascular:  Negative for chest pain.  Gastrointestinal:  Negative for abdominal pain, diarrhea and vomiting.  Skin:  Negative for wound.  Neurological:  Negative for weakness, numbness and headaches.    Updated Vital Signs BP (!) 177/105 (BP  Location: Left Arm)   Pulse 74   Temp 98.9 F (37.2 C) (Oral)   Resp 18   SpO2 100%   Physical Exam Vitals and nursing note reviewed.  Constitutional:      General: She is not in acute distress.    Appearance: Normal appearance. She is obese.  HENT:     Head: Normocephalic.     Mouth/Throat:     Mouth: Mucous membranes are moist.  Eyes:     Extraocular Movements: Extraocular movements intact.  Cardiovascular:     Rate and Rhythm: Normal rate.  Pulmonary:     Effort: Pulmonary effort is normal. No  respiratory distress.  Abdominal:     Palpations: Abdomen is soft.     Tenderness: There is no abdominal tenderness.  Musculoskeletal:        General: No deformity or signs of injury.     Cervical back: No rigidity.  Skin:    General: Skin is warm.  Neurological:     General: No focal deficit present.     Mental Status: She is alert and oriented to person, place, and time. Mental status is at baseline.  Psychiatric:        Mood and Affect: Mood normal.     (all labs ordered are listed, but only abnormal results are displayed) Labs Reviewed  BASIC METABOLIC PANEL WITH GFR - Abnormal; Notable for the following components:      Result Value   Glucose, Bld 118 (*)    All other components within normal limits  CBC - Abnormal; Notable for the following components:   WBC 12.5 (*)    MCV 100.5 (*)    All other components within normal limits  RESP PANEL BY RT-PCR (RSV, FLU A&B, COVID)  [id number]    EKG: None  Radiology: DG Chest 2 View Result Date: 10/10/2024 EXAM: 2 VIEW(S) XRAY OF THE CHEST 10/10/2024 11:58:00 AM COMPARISON: Prior study dated 12/18/2023. CLINICAL HISTORY: Shortness of breath, productive cough, PNA evaluation. FINDINGS: LUNGS AND PLEURA: No focal pulmonary opacity. No pleural effusion. No pneumothorax. HEART AND MEDIASTINUM: Stable cardiomegaly compared to the prior study dated 12/18/2023. BONES AND SOFT TISSUES: No acute osseous abnormality. IMPRESSION: 1. No acute cardiopulmonary process. Electronically signed by: Lynwood Seip MD 10/10/2024 12:33 PM EST RP Workstation: [id number]   DG Pelvis Portable Result Date: 10/10/2024 CLINICAL DATA:  Hip pain status post fall. EXAM: DG PORTABLE PELVIS COMPARISON:  Abdominopelvic CT 07/31/2022. FINDINGS: Two AP views of the pelvis are submitted. There is severe bilateral hip osteoarthritis which appears mildly progressive from the previous CT. No evidence of acute pelvic fracture or dislocation. The sacroiliac joints and symphysis  pubis are intact. No focal soft tissue abnormalities are identified. IMPRESSION: No evidence of acute pelvic fracture or dislocation. If there is hip pain or inability to bear weight, dedicated hip radiographs should be considered. Severe bilateral hip osteoarthritis. Electronically Signed   By: Elsie Perone M.D.   On: 10/10/2024 12:20     Procedures   Medications Ordered in the ED  spironolactone  (ALDACTONE ) tablet 25 mg (25 mg Oral Given 10/10/24 1205)  oxyCODONE -acetaminophen  (PERCOCET/ROXICET) 5-325 MG per tablet 1 tablet (has no administration in time range)  furosemide  (LASIX ) tablet 40 mg (40 mg Oral Given 10/10/24 1459)  HYDROmorphone  (DILAUDID ) injection 1 mg (1 mg Subcutaneous Given 10/10/24 1500)  Medical Decision Making Risk Prescription drug management.   58 year old female presents emergency department with concern for upper respiratory congestion as well as acute on chronic whole body pain with recent mechanical fall down onto her bottom.  She does have history of RA and chronic pain secondary to this.  No head injury or loss of consciousness.  Patient was hypertensive on arrival, did miss her morning medications.  Otherwise normal vitals.  She does not have any obvious traumatic findings or focal pain on exam.  She does have some nasal congestion and raspy voice.  Lung exam limited by body habitus.  Blood work is baseline, respiratory panel is negative, x-ray imaging shows no pneumonia or acute finding.  Of note patient does request for us  to place an IV and give her IV pain medicine.  I have seen this listed in previous notes.  So we proceeded cautiously, we gave her 1 subcutaneous shot of Dilaudid  and discussed transitioning her to oral.  She understands.  Patient at this time appears safe and stable for discharge and close outpatient follow up. Discharge plan and strict return to ED precautions discussed, patient verbalizes  understanding and agreement.     Final diagnoses:  Fall, initial encounter    ED Discharge Orders          Ordered    oxyCODONE -acetaminophen  (PERCOCET/ROXICET) 5-325 MG tablet  Every 8 hours PRN        10/10/24 1613               Lailah Marcelli M, DO 10/10/24 1617

## 2024-10-10 NOTE — TOC CM/SW Note (Signed)
 Received call from patient's daughter, requesting update on her mom. CM explained that patient is currently in radiology and not expected to d/c this evening. Daughter to call back in the morning for further updates on d/c plan.   Merilee Batty, MSN, RN Case Management [phone]

## 2024-10-11 MED ORDER — OXYCODONE HCL 5 MG PO TABS
5.0000 mg | ORAL_TABLET | Freq: Once | ORAL | Status: AC
  Administered 2024-10-11: 5 mg via ORAL
  Filled 2024-10-11: qty 1

## 2024-10-11 MED ORDER — IBUPROFEN 200 MG PO TABS
400.0000 mg | ORAL_TABLET | ORAL | Status: DC | PRN
Start: 2024-10-11 — End: 2024-10-11
  Administered 2024-10-11: 400 mg via ORAL
  Filled 2024-10-11: qty 2

## 2024-10-11 MED ORDER — GABAPENTIN 300 MG PO CAPS
300.0000 mg | ORAL_CAPSULE | Freq: Three times a day (TID) | ORAL | Status: DC
  Administered 2024-10-11 (×2): 300 mg via ORAL
  Filled 2024-10-11 (×2): qty 1

## 2024-10-11 MED ORDER — ACETAMINOPHEN 500 MG PO TABS
1000.0000 mg | ORAL_TABLET | Freq: Four times a day (QID) | ORAL | Status: DC | PRN
Start: 2024-10-11 — End: 2024-10-11
  Administered 2024-10-11 (×2): 1000 mg via ORAL
  Filled 2024-10-11 (×2): qty 2

## 2024-10-11 NOTE — NC FL2 (Signed)
 [place]  MEDICAID FL2 LEVEL OF CARE FORM     IDENTIFICATION  Patient Name: Sierra Atkins Birthdate: [birth year] Sex: female Admission Date (Current Location): 10/10/2024  [facility] and [place] Number:  Producer, Television/film/video and Address:  [facility],  [address], [place] [address]      Provider Number: [id number]  Attending Physician Name and Address:  Mannie Fairy DASEN, DO  Relative Name and Phone Number:  Aidan, [place] (Daughter)  [phone] [facility])    Current Level of Care: Hospital Recommended Level of Care: Skilled Nursing Facility Prior Approval Number:    Date Approved/Denied:   PASRR Number: [id number]  Discharge Plan: SNF    Current Diagnoses: Patient Active Problem List   Diagnosis Date Noted   Hypokalemia 12/06/2023   Peripheral polyneuropathy 12/06/2023   Essential hypertension 12/06/2023   Lupus (systemic lupus erythematosus) (HCC) 12/06/2023   Cellulitis of left lower extremity 12/05/2023    Orientation RESPIRATION BLADDER Height & Weight     Self, Time, Situation, Place  Normal Continent Weight:   Height:     BEHAVIORAL SYMPTOMS/MOOD NEUROLOGICAL BOWEL NUTRITION STATUS      Continent Diet (Regular)  AMBULATORY STATUS COMMUNICATION OF NEEDS Skin   Extensive Assist Verbally Normal                       Personal Care Assistance Level of Assistance  Bathing, Feeding, Dressing Bathing Assistance: Maximum assistance Feeding assistance: Independent Dressing Assistance: Maximum assistance     Functional Limitations Info  Sight, Hearing, Speech Sight Info: Adequate Hearing Info: Adequate Speech Info: Adequate    SPECIAL CARE FACTORS FREQUENCY  PT (By licensed PT), OT (By licensed OT)     PT Frequency: x5/week OT Frequency: x5/week            Contractures Contractures Info: Not present    Additional Factors Info  Code Status, Allergies Code Status Info: Full Allergies Info: Fentanyl  Codeine  Morphine  And Codeine           Current Medications (10/11/2024):  This is the current hospital active medication list Current Facility-Administered Medications  Medication Dose Route Frequency Provider Last Rate Last Admin   acetaminophen  (TYLENOL ) tablet 1,000 mg  1,000 mg Oral Q6H PRN Jerral Meth, MD   1,000 mg at 10/11/24 0844   furosemide  (LASIX ) tablet 40 mg  40 mg Oral BID Kingsley, Victoria K, DO   40 mg at 10/11/24 9156   gabapentin  (NEURONTIN ) capsule 300 mg  300 mg Oral TID Countryman, Chase, MD   300 mg at 10/11/24 0843   ibuprofen (ADVIL) tablet 400 mg  400 mg Oral Q4H PRN Countryman, Chase, MD   400 mg at 10/11/24 9156   spironolactone  (ALDACTONE ) tablet 25 mg  25 mg Oral Daily Trifan, Matthew J, MD   25 mg at 10/11/24 9156   Current Outpatient Medications  Medication Sig Dispense Refill   DULERA 100-5 MCG/ACT AERO Inhale 2 puffs into the lungs 2 (two) times daily.     DULoxetine (CYMBALTA) 60 MG capsule Take 60 mg by mouth 2 (two) times daily.     oxyCODONE -acetaminophen  (PERCOCET/ROXICET) 5-325 MG tablet Take 1 tablet by mouth every 8 (eight) hours as needed for severe pain (pain score 7-10). 10 tablet 0   traZODone (DESYREL) 50 MG tablet Take 50 mg by mouth at bedtime as needed for sleep.     diclofenac  Sodium (VOLTAREN ) 1 % GEL Apply 1 Application topically as needed (pain).  fluticasone  (FLONASE ) 50 MCG/ACT nasal spray Place 2 sprays into both nostrils daily for 7 days. (Patient taking differently: Place 1-2 sprays into both nostrils daily as needed for allergies.) 9.9 mL 2   furosemide  (LASIX ) 40 MG tablet Take 40 mg by mouth 2 (two) times daily.     gabapentin  (NEURONTIN ) 300 MG capsule Take 300 mg by mouth 3 (three) times daily.     HYDROcodone -acetaminophen  (NORCO) 10-325 MG tablet Take 1 tablet by mouth every 6 (six) hours as needed. (Patient not taking: Reported on 12/06/2023)     hydroxychloroquine  (PLAQUENIL ) 200 MG tablet Take 200 mg by mouth daily.      methocarbamol  (ROBAXIN ) 500 MG tablet Take 500 mg by mouth as needed for muscle spasms.     methotrexate  (RHEUMATREX) 2.5 MG tablet Take 15 mg by mouth once a week.     Multiple Vitamin (MULTIVITAMIN) capsule Take 1 capsule by mouth daily.       Omega-3 Fatty Acids (FISH OIL) 1000 MG CAPS Take 1,000 mg by mouth daily.     Oxycodone  HCl 10 MG TABS Take 1 tablet (10 mg total) by mouth every 6 (six) hours as needed for up to 10 doses (pain). 10 tablet 0   potassium chloride  SA (KLOR-CON  M) 20 MEQ tablet Take 1 tablet (20 mEq total) by mouth daily for 6 days. (Patient not taking: Reported on 11/22/2023) 6 tablet 0   spironolactone  (ALDACTONE ) 25 MG tablet Take 1 tablet by mouth daily.     zolpidem  (AMBIEN ) 10 MG tablet Take 1 tablet (10 mg total) by mouth at bedtime for 7 days. 7 tablet 0     Discharge Medications: Please see discharge summary for a list of discharge medications.  Relevant Imaging Results:  Relevant Lab Results:   Additional Information SSN: [id number]  Kari JONETTA Daisy, LCSW

## 2024-10-11 NOTE — Progress Notes (Signed)
 CSW spoke with pt at bedside. Pt reports she has not walked since being in the ER. She states she is unable to.   Per pt, she lived in a hotel by herself. She states she was at the hotel from the time she was discharged from [facility]. Pt reports she paid for this on her own. She cannot live with her daughter due to there being no room. PT will evaluate.   CSW attempted to contact admissions at [facility] and left vm for [facility].

## 2024-10-11 NOTE — ED Provider Notes (Signed)
 Patient's friend is providing a rollator.  Will discharge.   Mannie Pac T, DO 10/11/24 1046

## 2024-10-11 NOTE — ED Notes (Addendum)
 Daughter called back and states she in also in hospital for pre-E. Daughter states that a family friend named Rosina (no last name provided) has the pt mobility tool. Rosina # [phone]. Rosina contacted and states she is on the way to hospital

## 2024-10-11 NOTE — ED Notes (Signed)
 Pt daughter notified of pt pending dispo

## 2024-10-11 NOTE — Evaluation (Signed)
 Physical Therapy Evaluation Patient Details Name: Sierra Atkins MRN: [id number] DOB: [birth year] Today's Date: 10/11/2024  History of Present Illness  58 yo female comes to Ed after a fall. Recently DC'd from SNF/rehab.EFY:amjpw surgery, HTN, severe hip DJD, lupus  Clinical Impression  Pt admitted with above diagnosis.  Pt currently with functional limitations due to the deficits listed below (see PT Problem List). Pt will benefit from acute skilled PT to increase their independence and safety with mobility to allow discharge.     The patient is   very debilitated from bilateral hip DJD. Patient reports that she is being followed by ortho.  Patient reports she felt that she left rehab too soon. Patient  able to mobilize to sitting, and slightly stood at [facility] but reports fear to fall. Assisted back onto stretcher. PT will have better success for safe mobility if patient is on a regular bed due to safety on higher stretcher.  Patient will benefit from continued inpatient follow up therapy, <3 hours/day       If plan is discharge home, recommend the following: Two people to help with walking and/or transfers;A lot of help with bathing/dressing/bathroom;Assist for transportation;Help with stairs or ramp for entrance   Can travel by private vehicle        Equipment Recommendations None recommended by PT  Recommendations for Other Services       Functional Status Assessment Patient has had a recent decline in their functional status and/or demonstrates limited ability to make significant improvements in function in a reasonable and predictable amount of time     Precautions / Restrictions Precautions Precautions: Fall Restrictions Weight Bearing Restrictions Per Provider Order: No      Mobility  Bed Mobility Overal bed mobility: Needs Assistance Bed Mobility: Rolling Rolling: Mod assist, Used rails   Supine to sit: Max assist, +2 for physical assistance, +2 for safety/equipment,  Used rails Sit to supine: +2 for physical assistance, Total assist, +2 for safety/equipment, Used rails   General bed mobility comments: assist legs to edge, patient pulls up on rail,  assist with trunk and assist to turn on stretcher. assist to lift legs back onto stretcher    Transfers Overall transfer level: Needs assistance Equipment used: Rolling walker (2 wheels) Transfers: Sit to/from Stand Sit to Stand: Max assist, +2 physical assistance, +2 safety/equipment           General transfer comment: very limited  ability to stand at Rw with complaints  of hip pain and fear to fall    Ambulation/Gait                  Stairs            Wheelchair Mobility     Tilt Bed    Modified Rankin (Stroke Patients Only)       Balance Overall balance assessment: History of Falls, Needs assistance Sitting-balance support: Bilateral upper extremity supported, Feet supported Sitting balance-Leahy Scale: Fair     Standing balance support: Reliant on assistive device for balance, During functional activity, Bilateral upper extremity supported Standing balance-Leahy Scale: Poor                               Pertinent Vitals/Pain Pain Assessment Pain Assessment: Faces Faces Pain Scale: Hurts worst Pain Location: both hips with mobility Pain Descriptors / Indicators: Aching, Discomfort, Grimacing, Crying, Guarding Pain Intervention(s): Monitored during session, Patient requesting pain meds-RN notified, Limited  activity within patient's tolerance    Home Living Family/patient expects to be discharged to:: Private residence                   Additional Comments: will be in a motel if DC'd, was in extended stay type, dtr is in hospital post partum    Prior Function Prior Level of Function : Needs assist             Mobility Comments: recently  ambu;ating with rollator ADLs Comments: limited in self sere due to pain     Extremity/Trunk  Assessment   Upper Extremity Assessment Upper Extremity Assessment: Overall WFL for tasks assessed    Lower Extremity Assessment Lower Extremity Assessment: LLE deficits/detail;RLE deficits/detail RLE Deficits / Details: decreased hip flex to sit up on edge RLE: Unable to fully assess due to pain LLE Deficits / Details: same as right LLE: Unable to fully assess due to pain    Cervical / Trunk Assessment Cervical / Trunk Assessment: Normal;Other exceptions Cervical / Trunk Exceptions: body habitus  Communication   Communication Communication: No apparent difficulties    Cognition Arousal: Alert Behavior During Therapy: WFL for tasks assessed/performed, Lability   PT - Cognitive impairments: No apparent impairments                       PT - Cognition Comments: quite labile when speaks about  inability to care for self and needing bil THA, states she has to lose weight and it has been difficult Following commands: Intact       Cueing       General Comments      Exercises     Assessment/Plan    PT Assessment Patient needs continued PT services  PT Problem List Decreased strength;Decreased range of motion;Decreased knowledge of use of DME;Decreased activity tolerance;Decreased balance;Decreased knowledge of precautions;Decreased mobility       PT Treatment Interventions DME instruction;Gait training;Functional mobility training;Therapeutic activities;Therapeutic exercise;Balance training;Patient/family education;Wheelchair mobility training    PT Goals (Current goals can be found in the Care Plan section)  Acute Rehab PT Goals Patient Stated Goal: to get hip surgery PT Goal Formulation: With patient Time For Goal Achievement: 10/25/24 Potential to Achieve Goals: Fair    Frequency Min 2X/week     Co-evaluation               AM-PAC PT 6 Clicks Mobility  Outcome Measure Help needed turning from your back to your side while in a flat bed  without using bedrails?: Total Help needed moving from lying on your back to sitting on the side of a flat bed without using bedrails?: Total Help needed moving to and from a bed to a chair (including a wheelchair)?: Total Help needed standing up from a chair using your arms (e.g., wheelchair or bedside chair)?: Total Help needed to walk in hospital room?: Total Help needed climbing 3-5 steps with a railing? : Total 6 Click Score: 6    End of Session Equipment Utilized During Treatment: Gait belt Activity Tolerance: Patient limited by pain Patient left: in bed Nurse Communication: Mobility status PT Visit Diagnosis: Unsteadiness on feet (R26.81);Difficulty in walking, not elsewhere classified (R26.2);Pain Pain - part of body: Hip    Time: 8986-8964 PT Time Calculation (min) (ACUTE ONLY): 22 min   Charges:   PT Evaluation $PT Eval Low Complexity: 1 Low   PT General Charges $$ ACUTE PT VISIT: 1 Visit  [name] PT Acute Rehabilitation Services Office [phone]   [name] 10/11/2024, 11:14 AM

## 2024-10-11 NOTE — Progress Notes (Addendum)
 PT rec: SNF. Pt faxed out. Bed offers pending.   Addend @ 12:01PM Per chart review, pt has been discharged as recommended by leadership without ICM completing SNF workup. ICM signing off.

## 2024-10-18 ENCOUNTER — Telehealth: Payer: Self-pay

## 2024-10-18 NOTE — Telephone Encounter (Signed)
 Received call from patient requesting SNF placement. Patient reports that she discussed with pcp who ordered [facility] care. CM explained that unless patient is in the hospital CM/SW can not assist with placement. Encouraged patient to further discuss with pcp or present to the ED should she feel it is needed. Patient verbalized understanding.   Merilee Batty, MSN, RN Case Management [phone]

## 2024-11-05 ENCOUNTER — Emergency Department (HOSPITAL_COMMUNITY)

## 2024-11-05 ENCOUNTER — Other Ambulatory Visit: Payer: Self-pay

## 2024-11-05 ENCOUNTER — Inpatient Hospital Stay (HOSPITAL_COMMUNITY)
Admission: EM | Admit: 2024-11-05 | Discharge: 2024-11-17 | DRG: 291 | Disposition: A | Discharge status: Skilled Nursing Facility | Attending: Student | Admitting: Student

## 2024-11-05 ENCOUNTER — Encounter (HOSPITAL_COMMUNITY): Payer: Self-pay

## 2024-11-05 DIAGNOSIS — R0600 Dyspnea, unspecified: Secondary | ICD-10-CM | POA: Diagnosis present

## 2024-11-05 DIAGNOSIS — E875 Hyperkalemia: Secondary | ICD-10-CM | POA: Diagnosis present

## 2024-11-05 DIAGNOSIS — Z885 Allergy status to narcotic agent status: Secondary | ICD-10-CM

## 2024-11-05 DIAGNOSIS — R400 Somnolence: Secondary | ICD-10-CM

## 2024-11-05 DIAGNOSIS — Z982 Presence of cerebrospinal fluid drainage device: Secondary | ICD-10-CM

## 2024-11-05 DIAGNOSIS — R0602 Shortness of breath: Secondary | ICD-10-CM | POA: Diagnosis not present

## 2024-11-05 DIAGNOSIS — I272 Pulmonary hypertension, unspecified: Secondary | ICD-10-CM | POA: Diagnosis present

## 2024-11-05 DIAGNOSIS — Z8269 Family history of other diseases of the musculoskeletal system and connective tissue: Secondary | ICD-10-CM

## 2024-11-05 DIAGNOSIS — M329 Systemic lupus erythematosus, unspecified: Secondary | ICD-10-CM | POA: Diagnosis present

## 2024-11-05 DIAGNOSIS — I110 Hypertensive heart disease with heart failure: Principal | ICD-10-CM | POA: Diagnosis present

## 2024-11-05 DIAGNOSIS — Z7951 Long term (current) use of inhaled steroids: Secondary | ICD-10-CM

## 2024-11-05 DIAGNOSIS — I878 Other specified disorders of veins: Secondary | ICD-10-CM | POA: Diagnosis present

## 2024-11-05 DIAGNOSIS — Z751 Person awaiting admission to adequate facility elsewhere: Secondary | ICD-10-CM

## 2024-11-05 DIAGNOSIS — F17210 Nicotine dependence, cigarettes, uncomplicated: Secondary | ICD-10-CM | POA: Diagnosis present

## 2024-11-05 DIAGNOSIS — Z8249 Family history of ischemic heart disease and other diseases of the circulatory system: Secondary | ICD-10-CM

## 2024-11-05 DIAGNOSIS — E66813 Obesity, class 3: Secondary | ICD-10-CM | POA: Diagnosis present

## 2024-11-05 DIAGNOSIS — Z955 Presence of coronary angioplasty implant and graft: Secondary | ICD-10-CM

## 2024-11-05 DIAGNOSIS — J9601 Acute respiratory failure with hypoxia: Principal | ICD-10-CM

## 2024-11-05 DIAGNOSIS — Z6841 Body Mass Index (BMI) 40.0 and over, adult: Secondary | ICD-10-CM

## 2024-11-05 DIAGNOSIS — G4700 Insomnia, unspecified: Secondary | ICD-10-CM | POA: Diagnosis present

## 2024-11-05 DIAGNOSIS — M1990 Unspecified osteoarthritis, unspecified site: Secondary | ICD-10-CM | POA: Diagnosis present

## 2024-11-05 DIAGNOSIS — F32A Depression, unspecified: Secondary | ICD-10-CM | POA: Diagnosis present

## 2024-11-05 DIAGNOSIS — M545 Low back pain, unspecified: Secondary | ICD-10-CM | POA: Diagnosis present

## 2024-11-05 DIAGNOSIS — E1165 Type 2 diabetes mellitus with hyperglycemia: Secondary | ICD-10-CM | POA: Diagnosis present

## 2024-11-05 DIAGNOSIS — Z79899 Other long term (current) drug therapy: Secondary | ICD-10-CM

## 2024-11-05 DIAGNOSIS — I951 Orthostatic hypotension: Secondary | ICD-10-CM | POA: Diagnosis present

## 2024-11-05 DIAGNOSIS — I5033 Acute on chronic diastolic (congestive) heart failure: Secondary | ICD-10-CM | POA: Diagnosis present

## 2024-11-05 DIAGNOSIS — G8929 Other chronic pain: Secondary | ICD-10-CM | POA: Diagnosis present

## 2024-11-05 DIAGNOSIS — E662 Morbid (severe) obesity with alveolar hypoventilation: Secondary | ICD-10-CM | POA: Diagnosis present

## 2024-11-05 DIAGNOSIS — Z794 Long term (current) use of insulin: Secondary | ICD-10-CM

## 2024-11-05 DIAGNOSIS — T380X5A Adverse effect of glucocorticoids and synthetic analogues, initial encounter: Secondary | ICD-10-CM | POA: Diagnosis present

## 2024-11-05 DIAGNOSIS — J441 Chronic obstructive pulmonary disease with (acute) exacerbation: Secondary | ICD-10-CM | POA: Diagnosis present

## 2024-11-05 LAB — BLOOD GAS, VENOUS
Acid-base deficit: 0.8 mmol/L (ref 0.0–2.0)
Bicarbonate: 24.3 mmol/L (ref 20.0–28.0)
O2 Saturation: 100 %
Patient temperature: 37
pCO2, Ven: 41 mmHg — ABNORMAL LOW (ref 44–60)
pH, Ven: 7.38 (ref 7.25–7.43)
pO2, Ven: 113 mmHg — ABNORMAL HIGH (ref 32–45)

## 2024-11-05 LAB — COMPREHENSIVE METABOLIC PANEL WITH GFR
ALT: 11 U/L (ref 0–44)
AST: 16 U/L (ref 15–41)
Albumin: 3.7 g/dL (ref 3.5–5.0)
Alkaline Phosphatase: 89 U/L (ref 38–126)
Anion gap: 10 (ref 5–15)
BUN: 12 mg/dL (ref 6–20)
CO2: 24 mmol/L (ref 22–32)
Calcium: 8.9 mg/dL (ref 8.9–10.3)
Chloride: 108 mmol/L (ref 98–111)
Creatinine, Ser: 0.7 mg/dL (ref 0.44–1.00)
GFR, Estimated: >60 mL/min (ref >60)
Glucose, Bld: 97 mg/dL (ref 70–99)
Potassium: 3.6 mmol/L (ref 3.5–5.1)
Sodium: 143 mmol/L (ref 135–145)
Total Bilirubin: 0.2 mg/dL (ref 0.0–1.2)
Total Protein: 7 g/dL (ref 6.5–8.1)

## 2024-11-05 LAB — PRO BRAIN NATRIURETIC PEPTIDE: Pro Brain Natriuretic Peptide: 82.2 pg/mL (ref <300.0)

## 2024-11-05 LAB — CBC
HCT: 38.6 % (ref 36.0–46.0)
Hemoglobin: 12.6 g/dL (ref 12.0–15.0)
MCH: 32.7 pg (ref 26.0–34.0)
MCHC: 32.6 g/dL (ref 30.0–36.0)
MCV: 100.3 fL — ABNORMAL HIGH (ref 80.0–100.0)
Platelets: 234 K/uL (ref 150–400)
RBC: 3.85 MIL/uL — ABNORMAL LOW (ref 3.87–5.11)
RDW: 14 % (ref 11.5–15.5)
WBC: 9.6 K/uL (ref 4.0–10.5)
nRBC: 0 % (ref 0.0–0.2)

## 2024-11-05 LAB — URINE DRUG SCREEN
Amphetamines: NEGATIVE
Barbiturates: NEGATIVE
Benzodiazepines: NEGATIVE
Cocaine: NEGATIVE
Fentanyl: NEGATIVE
Methadone Scn, Ur: NEGATIVE
Opiates: NEGATIVE
Tetrahydrocannabinol: NEGATIVE

## 2024-11-05 LAB — TROPONIN T, HIGH SENSITIVITY: Troponin T High Sensitivity: 16 ng/L (ref 0–19)

## 2024-11-05 LAB — ETHANOL: Alcohol, Ethyl (B): <15 mg/dL (ref <15)

## 2024-11-05 LAB — AMMONIA: Ammonia: 17 umol/L (ref 9–35)

## 2024-11-05 MED ORDER — NALOXONE HCL 0.4 MG/ML IJ SOLN
0.4000 mg | Freq: Once | INTRAMUSCULAR | Status: AC
  Administered 2024-11-05: 0.4 mg via INTRAVENOUS
  Filled 2024-11-05: qty 1

## 2024-11-05 MED ORDER — DULOXETINE HCL 60 MG PO CPEP
60.0000 mg | ORAL_CAPSULE | Freq: Two times a day (BID) | ORAL | Status: DC
  Administered 2024-11-05 – 2024-11-17 (×24): 60 mg via ORAL
  Filled 2024-11-05 (×3): qty 1
  Filled 2024-11-05: qty 2
  Filled 2024-11-05 (×2): qty 1
  Filled 2024-11-05: qty 2
  Filled 2024-11-05 (×17): qty 1

## 2024-11-05 MED ORDER — GABAPENTIN 300 MG PO CAPS
300.0000 mg | ORAL_CAPSULE | Freq: Three times a day (TID) | ORAL | Status: DC
  Administered 2024-11-05 – 2024-11-17 (×36): 300 mg via ORAL
  Filled 2024-11-05 (×36): qty 1

## 2024-11-05 MED ORDER — ENOXAPARIN SODIUM 40 MG/0.4ML IJ SOSY
40.0000 mg | PREFILLED_SYRINGE | INTRAMUSCULAR | Status: DC
  Administered 2024-11-05 – 2024-11-06 (×2): 40 mg via SUBCUTANEOUS
  Filled 2024-11-05 (×2): qty 0.4

## 2024-11-05 MED ORDER — IOHEXOL 350 MG/ML SOLN
75.0000 mL | Freq: Once | INTRAVENOUS | Status: AC | PRN
  Administered 2024-11-05: 75 mL via INTRAVENOUS

## 2024-11-05 MED ORDER — IPRATROPIUM-ALBUTEROL 0.5-2.5 (3) MG/3ML IN SOLN
3.0000 mL | Freq: Once | RESPIRATORY_TRACT | Status: AC
  Administered 2024-11-05: 3 mL via RESPIRATORY_TRACT
  Filled 2024-11-05: qty 3

## 2024-11-05 MED ORDER — IPRATROPIUM-ALBUTEROL 0.5-2.5 (3) MG/3ML IN SOLN
3.0000 mL | RESPIRATORY_TRACT | Status: DC | PRN
  Administered 2024-11-09 – 2024-11-13 (×6): 3 mL via RESPIRATORY_TRACT
  Filled 2024-11-05 (×6): qty 3

## 2024-11-05 MED ORDER — BUDESONIDE 0.25 MG/2ML IN SUSP
0.2500 mg | Freq: Two times a day (BID) | RESPIRATORY_TRACT | Status: DC
  Administered 2024-11-05 – 2024-11-17 (×24): 0.25 mg via RESPIRATORY_TRACT
  Filled 2024-11-05 (×25): qty 2

## 2024-11-05 MED ORDER — ACETAMINOPHEN 325 MG PO TABS
650.0000 mg | ORAL_TABLET | Freq: Once | ORAL | Status: AC
  Administered 2024-11-05: 650 mg via ORAL
  Filled 2024-11-05: qty 2

## 2024-11-05 MED ORDER — PREDNISONE 20 MG PO TABS
40.0000 mg | ORAL_TABLET | Freq: Every day | ORAL | Status: DC
  Administered 2024-11-05 – 2024-11-08 (×3): 40 mg via ORAL
  Filled 2024-11-05 (×3): qty 2

## 2024-11-05 MED ORDER — OXYCODONE-ACETAMINOPHEN 5-325 MG PO TABS
1.0000 | ORAL_TABLET | Freq: Three times a day (TID) | ORAL | Status: DC | PRN
  Administered 2024-11-05 – 2024-11-07 (×4): 1 via ORAL
  Filled 2024-11-05 (×4): qty 1

## 2024-11-05 MED ORDER — ONDANSETRON HCL 4 MG PO TABS: 4.0000 mg | ORAL_TABLET | Freq: Four times a day (QID) | ORAL | Status: DC | PRN

## 2024-11-05 MED ORDER — ARFORMOTEROL TARTRATE 15 MCG/2ML IN NEBU
15.0000 ug | INHALATION_SOLUTION | Freq: Two times a day (BID) | RESPIRATORY_TRACT | Status: DC
  Administered 2024-11-05 – 2024-11-17 (×24): 15 ug via RESPIRATORY_TRACT
  Filled 2024-11-05 (×25): qty 2

## 2024-11-05 MED ORDER — CEFAZOLIN SODIUM-DEXTROSE 2-4 GM/100ML-% IV SOLN
2.0000 g | Freq: Once | INTRAVENOUS | Status: AC
  Administered 2024-11-05: 2 g via INTRAVENOUS
  Filled 2024-11-05: qty 100

## 2024-11-05 MED ORDER — FUROSEMIDE 10 MG/ML IJ SOLN
40.0000 mg | Freq: Once | INTRAMUSCULAR | Status: AC
  Administered 2024-11-05: 40 mg via INTRAVENOUS
  Filled 2024-11-05: qty 4

## 2024-11-05 MED ORDER — ONDANSETRON HCL 4 MG/2ML IJ SOLN: 4.0000 mg | Freq: Four times a day (QID) | INTRAMUSCULAR | Status: DC | PRN

## 2024-11-05 MED ORDER — HYDROXYCHLOROQUINE SULFATE 200 MG PO TABS: 200.0000 mg | ORAL_TABLET | Freq: Every day | ORAL | Status: DC

## 2024-11-05 MED ORDER — FUROSEMIDE 10 MG/ML IJ SOLN
60.0000 mg | Freq: Two times a day (BID) | INTRAMUSCULAR | Status: DC
  Administered 2024-11-06 – 2024-11-08 (×5): 60 mg via INTRAVENOUS
  Filled 2024-11-05: qty 6
  Filled 2024-11-05: qty 8
  Filled 2024-11-05 (×3): qty 6

## 2024-11-05 MED ORDER — ACETAMINOPHEN 650 MG RE SUPP: 650.0000 mg | Freq: Four times a day (QID) | RECTAL | Status: DC | PRN

## 2024-11-05 MED ORDER — ACETAMINOPHEN 325 MG PO TABS
650.0000 mg | ORAL_TABLET | Freq: Four times a day (QID) | ORAL | Status: DC | PRN
  Administered 2024-11-07 – 2024-11-12 (×3): 650 mg via ORAL
  Filled 2024-11-05 (×4): qty 2

## 2024-11-05 NOTE — Progress Notes (Signed)
 RN called stating MD would like to try PT off BiPAP. RT instructed RN on turning off BiPAP and placing on 2-3  LPM nasal cannula that is located at bedside. RN confirmed she removed PT from biPAP and placed on 02. RT will eval PT when finished with current PT in 1519.

## 2024-11-05 NOTE — Progress Notes (Signed)
 Assisted with transporting PT to CT while on BiPAP- uneventful.  RN is aware PT is now back in [place]6.

## 2024-11-05 NOTE — ED Notes (Signed)
 Attempted to start IV access, unsuccessful

## 2024-11-05 NOTE — ED Provider Notes (Addendum)
 Patient signed to me by Dr. Neysa pending results of her workup here.  Patient was severely short of breath and was on BiPAP was able be weaned off.  She subsequently had a CT angio chest which was negative for PE.  Her head CT did not show any evidence of hydrocephalus.  Patient remains on 2 days of oxygen.  Unclear etiology for her dyspnea.  Patient did receive Narcan  with some improvement of her mental status.  She also has bilateral lower extremity erythema consistent with likely cellulitis.  Will start on IV antibiotics.  Will require admission and will consult hospitalist   Sierra Faden, MD 11/05/24 2014    Sierra Faden, MD 11/05/24 2015    Sierra Faden, MD 11/05/24 2025

## 2024-11-05 NOTE — ED Notes (Signed)
 Pt noted to be satting around 85% after getting breathing treatments. Pt reports feeling more SHOB and having a difficult time breathing. Pt falling asleep while talking. Respiratory called to place pt back on BiPAP.

## 2024-11-05 NOTE — Progress Notes (Signed)
 Called to PT room by Unit Secretary stating PT is saying she is not getting enough air. When RT arrived Sp02 100%- VT > 600- no distress noted. RT make a few adjustment to mask and headgear. PT states she is ok now.

## 2024-11-05 NOTE — H&P (Signed)
 History and Physical    Sierra Atkins FMW:[id number] DOB: 1966/08/01 DOA: 11/05/2024  I have briefly reviewed the patient's prior medical records in [facility]  PCP: Default, Provider, MD  Patient coming from: home  Chief Complaint: shortness of breath   HPI: Sierra Atkins is a 58 y.o. 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  She has noticed that she has been retaining a lot more fluid in the last couple of days, her legs have become more swollen and painful and she is experiencing increasing dyspnea.  This has happened to her before, and tells me that she had to be hospitalized and diuresed.  She was admitted just couple of months ago.  She denies any fever or chills, no chest pain, no abdominal discomfort, no nausea or vomiting.  She reports chronic hip pain, she is supposed to get hip replacement but she needs to lose more weight first.  She was recently started on Wegovy for weight loss  ED Course: In the ED she was afebrile, hypertensive with a blood pressure ranging from 150s and 180s, and initially required to be placed on BiPAP.  She was able to be weaned off of BiPAP and on my evaluation she is on 2 L and feeling more comfortable.  CMP is unremarkable, and so with CBC.  proBNP and troponin are both negative.  CT of the head unremarkable, CT angiogram of the chest negative for PE but did show cardiomegaly.  Due to persistent dyspnea we are asked to admit  Review of Systems: All systems reviewed, and apart from HPI, all negative  Past Medical History:  Diagnosis Date   Arthritis    Back pain    Brain tumor (benign) (HCC)    Hypertension    Lupus     Past Surgical History:  Procedure Laterality Date   ABDOMINAL HYSTERECTOMY     BRAIN SURGERY     CORONARY ANGIOPLASTY  WITH STENT PLACEMENT     JOINT REPLACEMENT     SHUNT REPLACEMENT       reports that she has quit smoking. Her smoking use included cigarettes. She does not have any smokeless tobacco history on file. She reports that she does not drink alcohol and does not use drugs.  Allergies[1]  Family History  Problem Relation Age of Onset   Hypertension Mother    Lupus Mother    Hypertension Father    Lupus Father     Prior to Admission medications  Medication Sig Start Date End Date Taking? Authorizing Provider  diclofenac  Sodium (VOLTAREN ) 1 % GEL Apply 1 Application topically as needed (pain). 04/16/23   [provider]  DULERA 100-5 MCG/ACT AERO Inhale 2 puffs into the lungs 2 (two) times daily. 07/10/24   [provider]  DULoxetine  (CYMBALTA ) 60 MG capsule Take 60 mg by mouth 2 (two) times daily. 08/09/24   [provider]  fluticasone  (FLONASE ) 50 MCG/ACT nasal spray Place 2 sprays into both nostrils daily for 7 days. Patient taking differently: Place 1-2 sprays into both nostrils daily as needed for allergies. 06/29/23 12/06/23  Veta Palma, PA-C  furosemide  (LASIX ) 40 MG tablet Take 40 mg by mouth 2 (two) times daily. 08/15/23   [provider]  gabapentin  (NEURONTIN ) 300 MG capsule Take 300 mg by mouth 3 (three) times daily. 12/21/22  [provider]  HYDROcodone -acetaminophen  (NORCO) 10-325 MG tablet Take 1 tablet by mouth every 6 (six) hours as needed. Patient not taking: Reported on 12/06/2023    [provider]  hydroxychloroquine  (PLAQUENIL ) 200 MG tablet Take 200 mg by mouth daily.    [provider]  methocarbamol  (ROBAXIN ) 500 MG tablet Take 500 mg by mouth as needed for muscle spasms. 08/06/23   [provider]  methotrexate  (RHEUMATREX) 2.5 MG tablet Take 15 mg by mouth once a week. 04/23/23   [provider]  Multiple Vitamin (MULTIVITAMIN) capsule Take 1 capsule by mouth daily.      [provider]  Omega-3 Fatty Acids (FISH OIL) 1000 MG CAPS Take 1,000 mg by mouth daily.    [provider]  Oxycodone  HCl 10 MG TABS Take 1 tablet (10 mg total) by mouth every 6 (six) hours as needed for up to 10 doses (pain). 12/16/23   Christobal Guadalajara, MD  oxyCODONE -acetaminophen  (PERCOCET/ROXICET) 5-325 MG tablet Take 1 tablet by mouth every 8 (eight) hours as needed for severe pain (pain score 7-10). 10/10/24   Horton, Roxie HERO, DO  potassium chloride  SA (KLOR-CON  M) 20 MEQ tablet Take 1 tablet (20 mEq total) by mouth daily for 6 days. Patient not taking: Reported on 11/22/2023 08/13/22 08/19/22  Neldon Hamp RAMAN, PA  spironolactone  (ALDACTONE ) 25 MG tablet Take 1 tablet by mouth daily. 08/15/23   [provider]  traZODone  (DESYREL ) 50 MG tablet Take 50 mg by mouth at bedtime as needed for sleep. 08/09/24   [provider]  zolpidem  (AMBIEN ) 10 MG tablet Take 1 tablet (10 mg total) by mouth at bedtime for 7 days. 12/16/23 12/23/23  Christobal Guadalajara, MD    Physical Exam: Vitals:   11/05/24 1632 11/05/24 1648 11/05/24 1700 11/05/24 1900  BP:    (!) 156/138  Pulse:    80  Resp:   19 (!) 30  Temp: 98.8 F (37.1 C) 98.8 F (37.1 C)    TempSrc: Rectal Rectal    SpO2:    100%  Weight:      Height:        Constitutional: NAD, calm, comfortable Eyes: PERRL, lids and conjunctivae normal ENMT: Mucous membranes are moist.  Neck: normal, supple Respiratory: Faint bibasilar crackles, faint wheezing present Cardiovascular: Regular rate and rhythm, no murmurs / rubs / gallops.  1+ pitting lower extremity edema. 2+ pedal pulses.  Abdomen: no tenderness, no masses palpated. Bowel sounds positive.  Musculoskeletal: no clubbing / cyanosis. Normal muscle tone.  Skin: Bilateral lower extremity redness Neurologic: CN 2-12 grossly intact. Strength 5/5 in all 4.  Psychiatric: Normal judgment and insight. Alert and oriented x 3. Normal mood.   Labs on Admission: I have personally  reviewed following labs and imaging studies  CBC: Recent Labs  Lab 11/05/24 1431  WBC 9.6  HGB 12.6  HCT 38.6  MCV 100.3*  PLT 234   Basic Metabolic Panel: Recent Labs  Lab 11/05/24 1650  NA 143  K 3.6  CL 108  CO2 24  GLUCOSE 97  BUN 12  CREATININE 0.70  CALCIUM 8.9   Liver Function Tests: Recent Labs  Lab 11/05/24 1650  AST 16  ALT 11  ALKPHOS 89  BILITOT 0.2  PROT 7.0  ALBUMIN 3.7   Coagulation Profile: No results for input(s): INR, PROTIME in the last 168 hours. BNP (last 3 results) Recent Labs    11/05/24 1650  PROBNP 82.2   CBG: No results  for input(s): GLUCAP in the last 168 hours. Thyroid Function Tests: No results for input(s): TSH, T4TOTAL, FREET4, T3FREE, THYROIDAB in the last 72 hours. Urine analysis:    Component Value Date/Time   COLORURINE YELLOW 12/05/2023 1724   APPEARANCEUR CLOUDY (A) 12/05/2023 1724   LABSPEC 1.025 12/05/2023 1724   PHURINE 6.5 12/05/2023 1724   GLUCOSEU NEGATIVE 12/05/2023 1724   HGBUR NEGATIVE 12/05/2023 1724   BILIRUBINUR NEGATIVE 12/05/2023 1724   KETONESUR NEGATIVE 12/05/2023 1724   PROTEINUR NEGATIVE 12/05/2023 1724   UROBILINOGEN 1.0 10/24/2011 0107   NITRITE NEGATIVE 12/05/2023 1724   LEUKOCYTESUR TRACE (A) 12/05/2023 1724     Radiological Exams on Admission: CT Angio Chest PE W/Cm &/Or Wo Cm Result Date: 11/05/2024 EXAM: CTA of the Chest with contrast for PE 11/05/2024 07:47:05 PM TECHNIQUE: CTA of the chest was performed after the administration of 75 mL of iohexol  (OMNIPAQUE ) 350 MG/ML injection. Multiplanar reformatted images are provided for review. MIP images are provided for review. Automated exposure control, iterative reconstruction, and/or weight based adjustment of the mA/kV was utilized to reduce the radiation dose to as low as reasonably achievable. COMPARISON: None available. CLINICAL HISTORY: Pulmonary embolism (PE) suspected, high prob. FINDINGS: PULMONARY ARTERIES:  Pulmonary arteries are adequately opacified for evaluation. No pulmonary embolism. Enlargement of the main pulmonary artery, suggesting pulmonary arterial hypertension. MEDIASTINUM: Mild cardiomegaly. Mild thoracic aortic atherosclerosis. LYMPH NODES: No mediastinal, hilar or axillary lymphadenopathy. LUNGS AND PLEURA: Mild bibasilar atelectasis. No focal consolidation or pulmonary edema. No pleural effusion or pneumothorax. UPPER ABDOMEN: Limited images of the upper abdomen are unremarkable. SOFT TISSUES AND BONES: Degenerative changes of the visualized thoracolumbar spine. No acute soft tissue abnormality. IMPRESSION: 1. No pulmonary embolism. 2. Mild cardiomegaly. 3. Suspected pulmonary arterial hypertension. Electronically signed by: Pinkie Pebbles MD 11/05/2024 07:53 PM EST RP Workstation: [id number]   CT Head Wo Contrast Result Date: 11/05/2024 CLINICAL DATA:  Fall yesterday. Altered mental status. Dizziness. Hypertension. EXAM: CT HEAD WITHOUT CONTRAST TECHNIQUE: Contiguous axial images were obtained from the base of the skull through the vertex without intravenous contrast. RADIATION DOSE REDUCTION: This exam was performed according to the departmental dose-optimization program which includes automated exposure control, adjustment of the mA and/or kV according to patient size and/or use of iterative reconstruction technique. COMPARISON:  06/20/2023 FINDINGS: Brain: No evidence of intracranial hemorrhage, acute infarction, extra-axial collection, or mass lesion/mass effect. Right posterior ventriculostomy remains in place with tip in the region of the 3rd ventricle. No evidence of hydrocephalus. Vascular:  No hyperdense vessel or other acute findings. Skull: No evidence of fracture or other significant bone abnormality. Prior suboccipital craniotomy again noted. Sinuses/Orbits:  No acute findings. Other: None. IMPRESSION: No acute intracranial abnormality. Right posterior ventriculostomy with tip near  the 3rd ventricle. No evidence of hydrocephalus. Electronically Signed   By: Norleen DELENA Kil M.D.   On: 11/05/2024 16:30   DG Chest Portable 1 View Result Date: 11/05/2024 CLINICAL DATA:  SHOB EXAM: PORTABLE CHEST 1 VIEW COMPARISON:  October 10, 2024, August 13, 2022 FINDINGS: Cardiomediastinal silhouette is enlarged. Similar appearance of enlarged pulmonary arteries. Background of mild diffuse peribronchial cuffing, similar compared to more remote priors. Masslike density along the RIGHT superior hilar border, likely summation of vascular shadows and cardiomegaly exacerbated by AP technique. And pleural effusion or pneumothorax. IMPRESSION: 1. Masslike density along the RIGHT superior hilar border, likely summation of vascular shadows and cardiomegaly exacerbated by AP technique. Recommend dedicated PA and lateral chest radiograph for improved evaluation. 2. Similar appearance of  cardiomegaly and enlarged pulmonary arteries as can be seen in pulmonary arterial hypertension. Electronically Signed   By: Corean Salter M.D.   On: 11/05/2024 13:07    EKG: Independently reviewed. Sinus rhythm   Assessment/Plan Principal problem Dyspnea -etiology is not entirely clear, but there are few possibilities.  She probably has a degree of pulmonary hypertension, I do suspect untreated OSA, possibly a degree of fluid overload/acute on chronic diastolic CHF and potentially of undiagnosed COPD given that she reported wheezing and ongoing tobacco use - Will provide furosemide , nebulizers, she has some wheezing on my evaluation and start steroids  Active problems Possible COPD exacerbation -never diagnosed, but she is still smoking and has smoked for a while.  She has faint wheezing on my exam, start steroids along with nebulizers  Acute on chronic diastolic CHF -2D echocardiogram done in September 2025 showed LVEF 65 to 70%, mild diastolic dysfunction but study was poor due to body habitus - She does have  evidence of fluid overload with crackles as well as lower extremity swelling, start IV diuresis.  Already received Lasix  this evening in the ED - She was given IV antibiotics for presumed bilateral lower extremity cellulitis, however her redness is symmetric, she has no fever and no leukocytosis, I do think it is mostly related to her swelling rather than an infection.  Hold further antibiotics  Presumed undiagnosed OSA /component of OHS -nebulizers as above, did require BiPAP when she first presented but now comfortable off of it.  Monitor on progressive  Lupus-continue Plaquenil .  MedRec was not done though  Obesity, morbid -BMI 49.  She has been evaluated by bariatrics as an outpatient, does not want to do weight reduction surgery but started on Wegovy.  Tobacco use-smoking about 5 cigarettes/day.  Counseled for cessation  Chronic arthritis, pain -after achieving some weight loss she tells me she will try to have hip replacement surgery  Depression-continue Cymbalta , please readdress all home medications once med rec is fully done  DVT prophylaxis: Lovenox   Code Status: Full code  Family Communication: no family at bedside  Bed Type: progressive Consults called: none Obs/Inp: obs    Nilda Fendt, MD, PhD [facility]  Contact via [email or web address]  11/05/2024, 8:45 PM         [1]  Allergies Allergen Reactions   Fentanyl Shortness Of Breath   Codeine    Morphine And Codeine

## 2024-11-05 NOTE — ED Triage Notes (Signed)
 Pt BIB ems, had a fall yesterday, noticed bilateral leg swelling yesterday, experiencing SOB from 2 days and dizziness for a week now. A&Ox4, BP elevated.

## 2024-11-05 NOTE — Progress Notes (Signed)
 Called to PT room by MD - MD had concerns about PT mentation. VBG on BIPAP (approximately 15 minutes) normal. PT getting good VT on documented settings. MD wanted adjustment(s) made to support co2- RT increased RR to 22- MD states repeat VBG will be ordered at some point. PT tolerating well at this time.

## 2024-11-05 NOTE — Progress Notes (Addendum)
 Checked on PT after BiPAP removal- RR 19, HR 75, Sp02 100% on 2 LPM. PT states she is breathing ok but does seem a little short of breath. Tried to convince PT to return to BiPAP and she refused. PT is aware of self, time, date, location, president. RN aware.

## 2024-11-05 NOTE — ED Notes (Signed)
 Pt to CT

## 2024-11-05 NOTE — ED Notes (Signed)
 X-ray at bedside.

## 2024-11-05 NOTE — ED Notes (Signed)
 Dr.Allen instructed RN to take pt off of Bipap to see how she does. RN called Respiratory to disconnect pt from Bipap machine. Pt Alert and oriented after disconnecting from Bipap. Will continue to monitor pt.

## 2024-11-05 NOTE — ED Provider Notes (Signed)
 [place] EMERGENCY DEPARTMENT AT [facility] Provider Note   CSN: [id number] Arrival date & time: 11/05/24  1120     Patient presents with: Shortness of Breath, Dizziness, and Fall   Sierra Atkins is a 58 y.o. female.   This is a 58 year old female presenting emergency department for shortness of breath x 2 days with worsening lower extremity edema and dyspnea on exertion.  She also notes some intermittent lightheadedness for the past week or so, none currently.  Notes having recent URI a few weeks ago and has lost her voice.  denies angioedema, tongue or lip swelling, does not feel her throat is closing.  Not having chest pain or palpitations.  No nausea or vomiting.  Has been compliant with her Lasix . She did note that she hurts all over after she rolled out of bed and fell to the floor 3 days ago. Denies hitting head or headaches.    Shortness of Breath Dizziness Associated symptoms: shortness of breath   Fall Associated symptoms include shortness of breath.       Prior to Admission medications  Medication Sig Start Date End Date Taking? Authorizing Provider  diclofenac  Sodium (VOLTAREN ) 1 % GEL Apply 1 Application topically as needed (pain). 04/16/23   [provider]  DULERA 100-5 MCG/ACT AERO Inhale 2 puffs into the lungs 2 (two) times daily. 07/10/24   [provider]  DULoxetine  (CYMBALTA ) 60 MG capsule Take 60 mg by mouth 2 (two) times daily. 08/09/24   [provider]  fluticasone  (FLONASE ) 50 MCG/ACT nasal spray Place 2 sprays into both nostrils daily for 7 days. Patient taking differently: Place 1-2 sprays into both nostrils daily as needed for allergies. 06/29/23 12/06/23  Veta Palma, PA-C  furosemide  (LASIX ) 40 MG tablet Take 40 mg by mouth 2 (two) times daily. 08/15/23   [provider]  gabapentin  (NEURONTIN ) 300 MG capsule Take 300 mg by mouth 3 (three) times daily. 12/21/22   [provider]   HYDROcodone -acetaminophen  (NORCO) 10-325 MG tablet Take 1 tablet by mouth every 6 (six) hours as needed. Patient not taking: Reported on 12/06/2023    [provider]  hydroxychloroquine  (PLAQUENIL ) 200 MG tablet Take 200 mg by mouth daily.    [provider]  methocarbamol  (ROBAXIN ) 500 MG tablet Take 500 mg by mouth as needed for muscle spasms. 08/06/23   [provider]  methotrexate  (RHEUMATREX) 2.5 MG tablet Take 15 mg by mouth once a week. 04/23/23   [provider]  Multiple Vitamin (MULTIVITAMIN) capsule Take 1 capsule by mouth daily.      [provider]  Omega-3 Fatty Acids (FISH OIL) 1000 MG CAPS Take 1,000 mg by mouth daily.    [provider]  Oxycodone  HCl 10 MG TABS Take 1 tablet (10 mg total) by mouth every 6 (six) hours as needed for up to 10 doses (pain). 12/16/23   Christobal Guadalajara, MD  oxyCODONE -acetaminophen  (PERCOCET/ROXICET) 5-325 MG tablet Take 1 tablet by mouth every 8 (eight) hours as needed for severe pain (pain score 7-10). 10/10/24   Horton, Roxie HERO, DO  potassium chloride  SA (KLOR-CON  M) 20 MEQ tablet Take 1 tablet (20 mEq total) by mouth daily for 6 days. Patient not taking: Reported on 11/22/2023 08/13/22 08/19/22  Neldon Hamp RAMAN, PA  spironolactone  (ALDACTONE ) 25 MG tablet Take 1 tablet by mouth daily. 08/15/23   [provider]  traZODone  (DESYREL ) 50 MG tablet Take 50 mg by mouth at bedtime as needed for  sleep. 08/09/24   [provider]  zolpidem  (AMBIEN ) 10 MG tablet Take 1 tablet (10 mg total) by mouth at bedtime for 7 days. 12/16/23 12/23/23  Christobal Guadalajara, MD    Allergies: Fentanyl, Codeine, and Morphine and codeine    Review of Systems  Respiratory:  Positive for shortness of breath.   Neurological:  Positive for dizziness.    Updated Vital Signs BP (!) 150/99   Pulse 81   Temp 97.7 F (36.5 C) (Oral)   Resp 20   Ht 5' 7 (1.702 m)   Wt (!) 142.9 kg   SpO2 100%   BMI 49.34 kg/m    Physical Exam Vitals and nursing note reviewed.  Constitutional:      Appearance: She is obese.  HENT:     Head: Normocephalic.  Cardiovascular:     Rate and Rhythm: Normal rate and regular rhythm.  Pulmonary:     Effort: Tachypnea and respiratory distress (mild) present.     Comments: Poor air movement. Musculoskeletal:     Right lower leg: Edema present.     Left lower leg: Edema present.  Skin:    General: Skin is warm and dry.     Capillary Refill: Capillary refill takes less than 2 seconds.  Neurological:     General: No focal deficit present.     Mental Status: She is alert and oriented to person, place, and time.  Psychiatric:        Mood and Affect: Mood normal.        Behavior: Behavior normal.     (all labs ordered are listed, but only abnormal results are displayed) Labs Reviewed  CBC - Abnormal; Notable for the following components:      Result Value   RBC 3.85 (*)    MCV 100.3 (*)    All other components within normal limits  BLOOD GAS, VENOUS - Abnormal; Notable for the following components:   pCO2, Ven 41 (*)    pO2, Ven 113 (*)    All other components within normal limits  AMMONIA  ETHANOL  URINE DRUG SCREEN  COMPREHENSIVE METABOLIC PANEL WITH GFR  PRO BRAIN NATRIURETIC PEPTIDE  TROPONIN T, HIGH SENSITIVITY    EKG: EKG Interpretation Date/Time:  Sunday November 05 2024 11:35:48 EST Ventricular Rate:  77 PR Interval:  155 QRS Duration:  105 QT Interval:  472 QTC Calculation: 535 R Axis:   -9  Text Interpretation: Sinus rhythm Abnormal R-wave progression, early transition Prolonged QT interval Confirmed by Neysa Clap [phone]) on 11/05/2024 2:10:34 PM  Radiology: DG Chest Portable 1 View Result Date: 11/05/2024 CLINICAL DATA:  SHOB EXAM: PORTABLE CHEST 1 VIEW COMPARISON:  October 10, 2024, August 13, 2022 FINDINGS: Cardiomediastinal silhouette is enlarged. Similar appearance of enlarged pulmonary arteries. Background of mild diffuse  peribronchial cuffing, similar compared to more remote priors. Masslike density along the RIGHT superior hilar border, likely summation of vascular shadows and cardiomegaly exacerbated by AP technique. And pleural effusion or pneumothorax. IMPRESSION: 1. Masslike density along the RIGHT superior hilar border, likely summation of vascular shadows and cardiomegaly exacerbated by AP technique. Recommend dedicated PA and lateral chest radiograph for improved evaluation. 2. Similar appearance of cardiomegaly and enlarged pulmonary arteries as can be seen in pulmonary arterial hypertension. Electronically Signed   By: Corean Salter M.D.   On: 11/05/2024 13:07     .Critical Care  Performed by: Neysa Clap PARAS, DO Authorized by: Neysa Clap PARAS, DO   Critical care provider statement:  Critical care time (minutes):  30   Critical care was necessary to treat or prevent imminent or life-threatening deterioration of the following conditions:  CNS failure or compromise and respiratory failure   Critical care was time spent personally by me on the following activities:  Development of treatment plan with patient or surrogate, discussions with consultants, evaluation of patient's response to treatment, examination of patient, ordering and review of laboratory studies, ordering and review of radiographic studies, ordering and performing treatments and interventions, pulse oximetry, re-evaluation of patient's condition and review of old charts    Medications Ordered in the ED  acetaminophen  (TYLENOL ) tablet 650 mg (has no administration in time range)  furosemide  (LASIX ) injection 40 mg (0 mg Intravenous Hold 11/05/24 1447)  naloxone  (NARCAN ) injection 0.4 mg (has no administration in time range)  ipratropium-albuterol  (DUONEB) 0.5-2.5 (3) MG/3ML nebulizer solution 3 mL (3 mLs Nebulization Given 11/05/24 1224)    Clinical Course as of 11/05/24 1539  Sun Nov 05, 2024  1321 DG Chest Portable 1  View MPRESSION: 1. Masslike density along the RIGHT superior hilar border, likely summation of vascular shadows and cardiomegaly exacerbated by AP technique. Recommend dedicated PA and lateral chest radiograph for improved evaluation. 2. Similar appearance of cardiomegaly and enlarged pulmonary arteries as can be seen in pulmonary arterial hypertension.   Electronically Signed   By: Corean Salter M.D.   On: 11/05/2024 13:07   [TY]  1405 Called to bedside by IV nurse.  Patient apparently desatted down into the 70s.  Her work of breathing is actually worsened.  She is somewhat sleepy, but answering questions appropriately.  Concern for possible CO2 retention.  Will get VBG and placed on BiPAP. [TY]  1410 Echo in January: 1. Poor accoustic windows. Left ventricular ejection fraction, by  estimation, is 60 to 65%. The left ventricle has normal function. The left  ventricle has no regional wall motion abnormalities. Left ventricular  diastolic parameters are indeterminate.   2. Right ventricular systolic function was not well visualized. The right  ventricular size is not well visualized.   3. The mitral valve was not well visualized. No evidence of mitral valve  regurgitation.   4. Aortic valve regurgitation is not visualized.   5. The inferior vena cava IVC is not well visualized.  [TY]  1519 pCO2, Ven(!): 41 Not retaining CO2.  [TY]  1519 Unclear etiology for patients somnolence.  She will wake up and talk briefly and quickly go back to sleep. Patients pupils are constricted. Does have some percocets. Will trial narcan . Will get CT head as it does appear she has history of Shunt. ETOH, Ammonia and UDS ordered as well.  [TY]  1536 Care signed out to afternoon team.  Disposition pending completion of workup, but anticipate admission. [TY]    Clinical Course User Index [TY] Neysa Caron PARAS, DO                                 Medical Decision Making This is a morbidly obese  58 year old female with history of hypertension, CHF, lupus, brain tumor status posttreatment.  Presented to the emergency department with shortness of breath.  She is afebrile, tachycardic, hemodynamically stable, is tachypneic was placed on supplemental oxygen by EMS.  Unclear if she was ever hypoxic.  She reports history of asthma, with no relief of inhalers.  Clinically hypervolemic, with impressive lower extremity edema.  Will get broad screening  labs.  Will trial albuterol  given her recent URI symptoms.  Plan to reevaluate.  See ED course for further MDM final disposition.  Amount and/or Complexity of Data Reviewed Labs: ordered. Decision-making details documented in ED Course. Radiology: ordered. Decision-making details documented in ED Course.  Risk OTC drugs. Prescription drug management.      Final diagnoses:  Somnolence  Acute respiratory failure with hypoxia [facility])    ED Discharge Orders     None          Neysa Caron PARAS, DO 11/05/24 1539

## 2024-11-05 NOTE — ED Notes (Signed)
 This nurse notified respiratory of bipap order.

## 2024-11-06 DIAGNOSIS — R0609 Other forms of dyspnea: Secondary | ICD-10-CM | POA: Diagnosis not present

## 2024-11-06 DIAGNOSIS — T380X5A Adverse effect of glucocorticoids and synthetic analogues, initial encounter: Secondary | ICD-10-CM | POA: Diagnosis present

## 2024-11-06 DIAGNOSIS — F32A Depression, unspecified: Secondary | ICD-10-CM | POA: Diagnosis present

## 2024-11-06 DIAGNOSIS — Z79899 Other long term (current) drug therapy: Secondary | ICD-10-CM | POA: Diagnosis not present

## 2024-11-06 DIAGNOSIS — E66813 Obesity, class 3: Secondary | ICD-10-CM | POA: Diagnosis present

## 2024-11-06 DIAGNOSIS — I951 Orthostatic hypotension: Secondary | ICD-10-CM | POA: Diagnosis present

## 2024-11-06 DIAGNOSIS — I110 Hypertensive heart disease with heart failure: Secondary | ICD-10-CM | POA: Diagnosis present

## 2024-11-06 DIAGNOSIS — E1165 Type 2 diabetes mellitus with hyperglycemia: Secondary | ICD-10-CM | POA: Diagnosis present

## 2024-11-06 DIAGNOSIS — M329 Systemic lupus erythematosus, unspecified: Secondary | ICD-10-CM | POA: Diagnosis present

## 2024-11-06 DIAGNOSIS — R0602 Shortness of breath: Secondary | ICD-10-CM | POA: Diagnosis present

## 2024-11-06 DIAGNOSIS — F17210 Nicotine dependence, cigarettes, uncomplicated: Secondary | ICD-10-CM | POA: Diagnosis present

## 2024-11-06 DIAGNOSIS — G894 Chronic pain syndrome: Secondary | ICD-10-CM | POA: Diagnosis not present

## 2024-11-06 DIAGNOSIS — M545 Low back pain, unspecified: Secondary | ICD-10-CM | POA: Diagnosis not present

## 2024-11-06 DIAGNOSIS — G4700 Insomnia, unspecified: Secondary | ICD-10-CM | POA: Diagnosis present

## 2024-11-06 DIAGNOSIS — Z885 Allergy status to narcotic agent status: Secondary | ICD-10-CM | POA: Diagnosis not present

## 2024-11-06 DIAGNOSIS — Z955 Presence of coronary angioplasty implant and graft: Secondary | ICD-10-CM | POA: Diagnosis not present

## 2024-11-06 DIAGNOSIS — J9601 Acute respiratory failure with hypoxia: Secondary | ICD-10-CM | POA: Diagnosis present

## 2024-11-06 DIAGNOSIS — J441 Chronic obstructive pulmonary disease with (acute) exacerbation: Secondary | ICD-10-CM | POA: Diagnosis present

## 2024-11-06 DIAGNOSIS — E662 Morbid (severe) obesity with alveolar hypoventilation: Secondary | ICD-10-CM | POA: Diagnosis present

## 2024-11-06 DIAGNOSIS — Z7951 Long term (current) use of inhaled steroids: Secondary | ICD-10-CM | POA: Diagnosis not present

## 2024-11-06 DIAGNOSIS — Z8249 Family history of ischemic heart disease and other diseases of the circulatory system: Secondary | ICD-10-CM | POA: Diagnosis not present

## 2024-11-06 DIAGNOSIS — I272 Pulmonary hypertension, unspecified: Secondary | ICD-10-CM | POA: Diagnosis present

## 2024-11-06 DIAGNOSIS — I5033 Acute on chronic diastolic (congestive) heart failure: Secondary | ICD-10-CM | POA: Diagnosis present

## 2024-11-06 DIAGNOSIS — Z6841 Body Mass Index (BMI) 40.0 and over, adult: Secondary | ICD-10-CM | POA: Diagnosis not present

## 2024-11-06 DIAGNOSIS — G4733 Obstructive sleep apnea (adult) (pediatric): Secondary | ICD-10-CM | POA: Diagnosis not present

## 2024-11-06 DIAGNOSIS — E875 Hyperkalemia: Secondary | ICD-10-CM | POA: Diagnosis present

## 2024-11-06 DIAGNOSIS — Z794 Long term (current) use of insulin: Secondary | ICD-10-CM | POA: Diagnosis not present

## 2024-11-06 DIAGNOSIS — G8929 Other chronic pain: Secondary | ICD-10-CM | POA: Diagnosis present

## 2024-11-06 LAB — COMPREHENSIVE METABOLIC PANEL WITH GFR
ALT: 10 U/L (ref 0–44)
AST: 15 U/L (ref 15–41)
Albumin: 3.9 g/dL (ref 3.5–5.0)
Alkaline Phosphatase: 102 U/L (ref 38–126)
Anion gap: 9 (ref 5–15)
BUN: 11 mg/dL (ref 6–20)
CO2: 28 mmol/L (ref 22–32)
Calcium: 8.9 mg/dL (ref 8.9–10.3)
Chloride: 103 mmol/L (ref 98–111)
Creatinine, Ser: 0.75 mg/dL (ref 0.44–1.00)
GFR, Estimated: >60 mL/min (ref >60)
Glucose, Bld: 201 mg/dL — ABNORMAL HIGH (ref 70–99)
Potassium: 4.1 mmol/L (ref 3.5–5.1)
Sodium: 140 mmol/L (ref 135–145)
Total Bilirubin: 0.2 mg/dL (ref 0.0–1.2)
Total Protein: 7.6 g/dL (ref 6.5–8.1)

## 2024-11-06 LAB — BLOOD GAS, VENOUS
Acid-Base Excess: 3.6 mmol/L — ABNORMAL HIGH (ref 0.0–2.0)
Bicarbonate: 30.8 mmol/L — ABNORMAL HIGH (ref 20.0–28.0)
O2 Saturation: 86.9 %
Patient temperature: 37
pCO2, Ven: 57 mmHg (ref 44–60)
pH, Ven: 7.34 (ref 7.25–7.43)
pO2, Ven: 53 mmHg — ABNORMAL HIGH (ref 32–45)

## 2024-11-06 LAB — CBC
HCT: 41.8 % (ref 36.0–46.0)
Hemoglobin: 13.2 g/dL (ref 12.0–15.0)
MCH: 32.2 pg (ref 26.0–34.0)
MCHC: 31.6 g/dL (ref 30.0–36.0)
MCV: 102 fL — ABNORMAL HIGH (ref 80.0–100.0)
Platelets: 340 K/uL (ref 150–400)
RBC: 4.1 MIL/uL (ref 3.87–5.11)
RDW: 13.7 % (ref 11.5–15.5)
WBC: 10.7 K/uL — ABNORMAL HIGH (ref 4.0–10.5)
nRBC: 0 % (ref 0.0–0.2)

## 2024-11-06 LAB — GLUCOSE, CAPILLARY: Glucose-Capillary: 141 mg/dL — ABNORMAL HIGH (ref 70–99)

## 2024-11-06 LAB — CBG MONITORING, ED: Glucose-Capillary: 262 mg/dL — ABNORMAL HIGH (ref 70–99)

## 2024-11-06 LAB — HIV ANTIBODY (ROUTINE TESTING W REFLEX): HIV Screen 4th Generation wRfx: NONREACTIVE

## 2024-11-06 LAB — MAGNESIUM: Magnesium: 2.1 mg/dL (ref 1.7–2.4)

## 2024-11-06 LAB — HEMOGLOBIN A1C
Hgb A1c MFr Bld: 6.7 % — ABNORMAL HIGH (ref 4.8–5.6)
Mean Plasma Glucose: 145.59 mg/dL

## 2024-11-06 MED ORDER — TRAZODONE HCL 100 MG PO TABS
50.0000 mg | ORAL_TABLET | Freq: Once | ORAL | Status: AC
  Administered 2024-11-06: 01:00:00 50 mg via ORAL
  Filled 2024-11-06: qty 1

## 2024-11-06 MED ORDER — ENSURE PLUS HIGH PROTEIN PO LIQD: 237.0000 mL | Freq: Two times a day (BID) | ORAL | Status: DC

## 2024-11-06 MED ORDER — INSULIN ASPART 100 UNIT/ML IJ SOLN
0.0000 [IU] | Freq: Three times a day (TID) | INTRAMUSCULAR | Status: DC
  Administered 2024-11-06: 12:00:00 8 [IU] via SUBCUTANEOUS
  Administered 2024-11-06: 18:00:00 2 [IU] via SUBCUTANEOUS
  Administered 2024-11-07: 17:00:00 8 [IU] via SUBCUTANEOUS
  Administered 2024-11-07: 12:00:00 5 [IU] via SUBCUTANEOUS
  Administered 2024-11-07: 09:00:00 3 [IU] via SUBCUTANEOUS
  Administered 2024-11-08: 17:00:00 5 [IU] via SUBCUTANEOUS
  Administered 2024-11-08: 12:00:00 8 [IU] via SUBCUTANEOUS
  Administered 2024-11-08: 08:00:00 2 [IU] via SUBCUTANEOUS
  Administered 2024-11-09: 17:00:00 11 [IU] via SUBCUTANEOUS
  Administered 2024-11-09: 08:00:00 2 [IU] via SUBCUTANEOUS
  Administered 2024-11-09 – 2024-11-10 (×2): 5 [IU] via SUBCUTANEOUS
  Administered 2024-11-10: 2 [IU] via SUBCUTANEOUS
  Administered 2024-11-10: 5 [IU] via SUBCUTANEOUS
  Administered 2024-11-11: 3 [IU] via SUBCUTANEOUS
  Administered 2024-11-11: 5 [IU] via SUBCUTANEOUS
  Administered 2024-11-11: 3 [IU] via SUBCUTANEOUS
  Administered 2024-11-12: 5 [IU] via SUBCUTANEOUS
  Administered 2024-11-12: 3 [IU] via SUBCUTANEOUS
  Administered 2024-11-12: 2 [IU] via SUBCUTANEOUS
  Administered 2024-11-13: 8 [IU] via SUBCUTANEOUS
  Administered 2024-11-13 – 2024-11-14 (×4): 3 [IU] via SUBCUTANEOUS
  Administered 2024-11-14: 5 [IU] via SUBCUTANEOUS
  Administered 2024-11-15 (×2): 3 [IU] via SUBCUTANEOUS
  Administered 2024-11-16 (×2): 2 [IU] via SUBCUTANEOUS
  Administered 2024-11-16: 3 [IU] via SUBCUTANEOUS
  Administered 2024-11-17: 2 [IU] via SUBCUTANEOUS
  Filled 2024-11-06: qty 8
  Filled 2024-11-06: qty 5
  Filled 2024-11-06: qty 3
  Filled 2024-11-06: qty 5
  Filled 2024-11-06: qty 8
  Filled 2024-11-06 (×2): qty 3
  Filled 2024-11-06: qty 5
  Filled 2024-11-06: qty 3
  Filled 2024-11-06 (×3): qty 2
  Filled 2024-11-06: qty 5
  Filled 2024-11-06: qty 2
  Filled 2024-11-06: qty 3
  Filled 2024-11-06: qty 2
  Filled 2024-11-06: qty 3
  Filled 2024-11-06 (×2): qty 8
  Filled 2024-11-06: qty 3
  Filled 2024-11-06: qty 5
  Filled 2024-11-06: qty 3
  Filled 2024-11-06 (×2): qty 5
  Filled 2024-11-06 (×2): qty 2
  Filled 2024-11-06 (×2): qty 3
  Filled 2024-11-06: qty 5
  Filled 2024-11-06 (×2): qty 3

## 2024-11-06 MED ORDER — TRAZODONE HCL 50 MG PO TABS
50.0000 mg | ORAL_TABLET | Freq: Once | ORAL | Status: AC
  Administered 2024-11-06: 21:00:00 50 mg via ORAL
  Filled 2024-11-06: qty 1

## 2024-11-06 NOTE — Progress Notes (Signed)
°   11/06/24 0134  BiPAP/CPAP/SIPAP  BiPAP/CPAP/SIPAP Pt Type Adult  BiPAP/CPAP/SIPAP SERVO (air)  Mask Type Full face mask  Dentures removed? Not applicable  Mask Size Large  Set Rate 15 breaths/min  Respiratory Rate 21 breaths/min  Pressure Support 10 cmH20  PEEP 5 cmH20  FiO2 (%) 40 %  Minute Ventilation 9.7  Leak 34  Peak Inspiratory Pressure (PIP) 8  Tidal Volume (Vt) 495  Patient Home Machine No  Patient Home Mask No  Patient Home Tubing No  Auto Titrate No  Press High Alarm 25 cmH2O  Device Plugged into RED Power Outlet Yes  BiPAP/CPAP /SiPAP Vitals  Pulse Rate 94  Resp (!) 24  SpO2 98 %  MEWS Score/Color  MEWS Score 1  MEWS Score Color Green

## 2024-11-06 NOTE — Progress Notes (Signed)
 PROGRESS NOTE  Sierra Atkins FMW:[id number] DOB: 1965-12-11   PCP: Default, Provider, MD  Patient is from: Home.  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   In ED, SBP ranges from 150s to 180s.  VBG without significant finding.  CBC and labs without significant finding.  proBNP and serial troponin negative.  CT head without acute finding.  CT angio chest negative for PE but showed mild cardiomegaly and signs of pulmonary hypertension.  Patient was started on BiPAP, IV Lasix , steroid and nebulizers and admitted.    Subjective: Seen and examined earlier this morning.  No major events overnight or this morning.  Reports lower back pain.  She states her back is bone-on-bone.  She reports taking oxycodone  15 mg every 6 hours at home.  Still with shortness of breath and edema.  Currently saturating in upper 90s to 100% on 2 L by nasal cannula.   Assessment and plan: Dyspnea and dyspnea with exertion: Multifactorial including untreated OSA/OHS, PAH, COPD and CHF.  Reports history of OSA but never used CPAP.  CT angio chest suggestive for PAH.  Also difficult to exclude COPD exacerbation and CHF exacerbation.  She has BLE edema.  It is difficult to determine fluid status given her body habitus.  CT angio chest did not show PE or infiltrate. - Check echocardiogram - Continue diuretics, steroid, nebulizers - BiPAP as needed. - Minimum oxygen to keep saturation above 88% due to risk of CO2 retention. - MI sedating medications.  We have discussed risk and  benefit.  Possible COPD exacerbation -never diagnosed, but she is still smoking and has smoked for a while.  Had wheezing on presentation.  Head exam is very limited due to weakness and body habitus.  -Continue steroid, nebulizers, BiPAP and supplemental oxygen as above -Doubt need for antibiotics. -Encourage smoking cessation.   Acute on chronic diastolic CHF -TTE 07/2024 with LVEF of 65 to 70% and mild DD but unable to assess RV function.  She is on Lasix  and Aldactone  at home.  Difficult to assess fluid status due to body habitus.  She has 1+ BLE edema.  Serial troponin and BNP negative.  CT angio chest with mild cardiomegaly and possible PAH. -Continue IV Lasix  60 mg every 12 hours. -Update echocardiogram to assess RV function -Strict intake and output, daily weight, renal functions and electrolytes.  Essential hypertension: Does not seem to be medication at home.  BP improved.  Takes Lasix  and Aldactone . -Diuretics as above.   OSA/OHS: Probably the main problem.  Patient reports history of OSA but never wore CPAP -Needs outpatient follow-up in sleep clinic -BiPAP as needed.    History of lupus: On Plaquenil  and methotrexate  at home. - Continue home meds   Tobacco use-smoking about 5 cigarettes/day.   -Counseled for cessation  Depression: Stable.  On Cymbalta . - Continue Cymbalta .  Hyperglycemia: No history of diabetes.  Likely due to steroid. - Check hemoglobin A1c - SSI-moderate  Chronic back pain/osteoarthritis/DDD: Main complaint this morning.  She reports taking oxycodone  15 mg every 6 hours at  home.  Review of narcotic database does not confirm this.  We have discussed risks and benefits of opiate especially in the setting of her respiratory issue.  -Continue current dose of Tylenol  and Percocet -Continue home Cymbalta . - PT/OT eval  Morbid obesity Body mass index is 49.34 kg/m.           DVT prophylaxis:  enoxaparin  (LOVENOX ) injection 40 mg Start: 11/05/24  2200 Place TED hose Start: 11/05/24 2156  Code Status: Full code Family Communication: Due to respiratory distress Level of care: Progressive Status is: Observation The patient will require care spanning > 2 midnights and should be moved to inpatient because: Respiratory distress requiring BiPAP   Final disposition: To be determined   55 minutes with more than 50% spent in reviewing records, counseling patient/family and coordinating care.  Consultants:  None  Procedures: None  Microbiology summarized: None  Objective: Vitals:   11/06/24 0134 11/06/24 0427 11/06/24 0500 11/06/24 0904  BP:  (!) 157/78 (!) 189/144 (!) 148/93  Pulse: 94 85  81  Resp: (!) 24 19 (!) 21 14  Temp:  98 F (36.7 C)  98.9 F (37.2 C)  TempSrc:  Axillary  Oral  SpO2: 98% 100%    Weight:      Height:        Examination:  GENERAL: No apparent distress.  Nontoxic. HEENT: MMM.  Vision and hearing grossly intact.  NECK: Supple.  No apparent JVD.  RESP: Diminished aeration bilaterally.  Limited exam due to body habitus and patient's inability to sit upright CVS:  RRR. Heart sounds normal.  ABD/GI/GU: BS+. Abd soft, NTND.  MSK/EXT:  Moves extremities. No apparent deformity.  1+ BLE edema. SKIN: no apparent skin lesion or wound NEURO: AA.  Oriented appropriately.  No apparent focal neuro deficit. PSYCH: Calm. Normal affect.   Sch Meds:  Scheduled Meds:  arformoterol   15 mcg Nebulization BID   budesonide  (PULMICORT ) nebulizer solution  0.25 mg Nebulization BID   DULoxetine   60 mg Oral BID   enoxaparin  (LOVENOX ) injection  40 mg Subcutaneous Q24H   furosemide   60 mg Intravenous Q12H   gabapentin   300 mg Oral TID   hydroxychloroquine   200 mg Oral Daily   predniSONE   40 mg Oral Q breakfast   Continuous Infusions: PRN Meds:.acetaminophen  **OR** acetaminophen , ipratropium-albuterol , ondansetron  **OR** ondansetron  (ZOFRAN ) IV, oxyCODONE -acetaminophen   Antimicrobials: Anti-infectives (From  admission, onward)    Start     Dose/Rate Route Frequency Ordered Stop   11/05/24 2200  hydroxychloroquine  (PLAQUENIL ) tablet 200 mg        200 mg Oral Daily 11/05/24 2155     11/05/24 2030  ceFAZolin  (ANCEF ) IVPB 2g/100 mL premix        2 g 200 mL/hr over 30 Minutes Intravenous  Once 11/05/24 2025 11/05/24 2147        I have personally reviewed the following labs and images: CBC: Recent Labs  Lab 11/05/24 1431 11/06/24 0448  WBC 9.6 10.7*  HGB 12.6 13.2  HCT 38.6 41.8  MCV 100.3* 102.0*  PLT 234 340   BMP &GFR Recent Labs  Lab 11/05/24 1650 11/06/24 0833  NA 143 140  K 3.6 4.1  CL 108 103  CO2 24 28  GLUCOSE 97 201*  BUN 12 11  CREATININE 0.70 0.75  CALCIUM 8.9 8.9  MG  --  2.1   Estimated Creatinine Clearance: 116.6 mL/min (by C-G formula based on SCr of 0.75 mg/dL). Liver & Pancreas: Recent Labs  Lab 11/05/24  1650 11/06/24 0833  AST 16 15  ALT 11 10  ALKPHOS 89 102  BILITOT 0.2 0.2  PROT 7.0 7.6  ALBUMIN 3.7 3.9   No results for input(s): LIPASE, AMYLASE in the last 168 hours. Recent Labs  Lab 11/05/24 1650  AMMONIA 17   Diabetic: No results for input(s): HGBA1C in the last 72 hours. No results for input(s): GLUCAP in the last 168 hours. Cardiac Enzymes: No results for input(s): CKTOTAL, CKMB, CKMBINDEX, TROPONINI in the last 168 hours. Recent Labs    11/05/24 1650  PROBNP 82.2   Coagulation Profile: No results for input(s): INR, PROTIME in the last 168 hours. Thyroid Function Tests: No results for input(s): TSH, T4TOTAL, FREET4, T3FREE, THYROIDAB in the last 72 hours. Lipid Profile: No results for input(s): CHOL, HDL, LDLCALC, TRIG, CHOLHDL, LDLDIRECT in the last 72 hours. Anemia Panel: No results for input(s): VITAMINB12, FOLATE, FERRITIN, TIBC, IRON, RETICCTPCT in the last 72 hours. Urine analysis:    Component Value Date/Time   COLORURINE YELLOW 12/05/2023 1724   APPEARANCEUR  CLOUDY (A) 12/05/2023 1724   LABSPEC 1.025 12/05/2023 1724   PHURINE 6.5 12/05/2023 1724   GLUCOSEU NEGATIVE 12/05/2023 1724   HGBUR NEGATIVE 12/05/2023 1724   BILIRUBINUR NEGATIVE 12/05/2023 1724   KETONESUR NEGATIVE 12/05/2023 1724   PROTEINUR NEGATIVE 12/05/2023 1724   UROBILINOGEN 1.0 10/24/2011 0107   NITRITE NEGATIVE 12/05/2023 1724   LEUKOCYTESUR TRACE (A) 12/05/2023 1724   Sepsis Labs: Invalid input(s): PROCALCITONIN, LACTICIDVEN  Microbiology: No results found for this or any previous visit (from the past 240 hours).  Radiology Studies: CT Angio Chest PE W/Cm &/Or Wo Cm Result Date: 11/05/2024 EXAM: CTA of the Chest with contrast for PE 11/05/2024 07:47:05 PM TECHNIQUE: CTA of the chest was performed after the administration of 75 mL of iohexol  (OMNIPAQUE ) 350 MG/ML injection. Multiplanar reformatted images are provided for review. MIP images are provided for review. Automated exposure control, iterative reconstruction, and/or weight based adjustment of the mA/kV was utilized to reduce the radiation dose to as low as reasonably achievable. COMPARISON: None available. CLINICAL HISTORY: Pulmonary embolism (PE) suspected, high prob. FINDINGS: PULMONARY ARTERIES: Pulmonary arteries are adequately opacified for evaluation. No pulmonary embolism. Enlargement of the main pulmonary artery, suggesting pulmonary arterial hypertension. MEDIASTINUM: Mild cardiomegaly. Mild thoracic aortic atherosclerosis. LYMPH NODES: No mediastinal, hilar or axillary lymphadenopathy. LUNGS AND PLEURA: Mild bibasilar atelectasis. No focal consolidation or pulmonary edema. No pleural effusion or pneumothorax. UPPER ABDOMEN: Limited images of the upper abdomen are unremarkable. SOFT TISSUES AND BONES: Degenerative changes of the visualized thoracolumbar spine. No acute soft tissue abnormality. IMPRESSION: 1. No pulmonary embolism. 2. Mild cardiomegaly. 3. Suspected pulmonary arterial hypertension. Electronically  signed by: Pinkie Pebbles MD 11/05/2024 07:53 PM EST RP Workstation: [id number]   CT Head Wo Contrast Result Date: 11/05/2024 CLINICAL DATA:  Fall yesterday. Altered mental status. Dizziness. Hypertension. EXAM: CT HEAD WITHOUT CONTRAST TECHNIQUE: Contiguous axial images were obtained from the base of the skull through the vertex without intravenous contrast. RADIATION DOSE REDUCTION: This exam was performed according to the departmental dose-optimization program which includes automated exposure control, adjustment of the mA and/or kV according to patient size and/or use of iterative reconstruction technique. COMPARISON:  06/20/2023 FINDINGS: Brain: No evidence of intracranial hemorrhage, acute infarction, extra-axial collection, or mass lesion/mass effect. Right posterior ventriculostomy remains in place with tip in the region of the 3rd ventricle. No evidence of hydrocephalus. Vascular:  No hyperdense vessel or other acute findings. Skull: No evidence of fracture or  other significant bone abnormality. Prior suboccipital craniotomy again noted. Sinuses/Orbits:  No acute findings. Other: None. IMPRESSION: No acute intracranial abnormality. Right posterior ventriculostomy with tip near the 3rd ventricle. No evidence of hydrocephalus. Electronically Signed   By: Norleen DELENA Kil M.D.   On: 11/05/2024 16:30   DG Chest Portable 1 View Result Date: 11/05/2024 CLINICAL DATA:  SHOB EXAM: PORTABLE CHEST 1 VIEW COMPARISON:  October 10, 2024, August 13, 2022 FINDINGS: Cardiomediastinal silhouette is enlarged. Similar appearance of enlarged pulmonary arteries. Background of mild diffuse peribronchial cuffing, similar compared to more remote priors. Masslike density along the RIGHT superior hilar border, likely summation of vascular shadows and cardiomegaly exacerbated by AP technique. And pleural effusion or pneumothorax. IMPRESSION: 1. Masslike density along the RIGHT superior hilar border, likely summation of  vascular shadows and cardiomegaly exacerbated by AP technique. Recommend dedicated PA and lateral chest radiograph for improved evaluation. 2. Similar appearance of cardiomegaly and enlarged pulmonary arteries as can be seen in pulmonary arterial hypertension. Electronically Signed   By: Corean Salter M.D.   On: 11/05/2024 13:07      Rian Koon T. Ava Deguire [facility]  If 7PM-7AM, please contact night-coverage [email or web address] 11/06/2024, 9:22 AM

## 2024-11-06 NOTE — Progress Notes (Signed)
°   11/06/24 2231  BiPAP/CPAP/SIPAP  BiPAP/CPAP/SIPAP Pt Type Adult  BiPAP/CPAP/SIPAP SERVO (air)  Mask Type Full face mask  Dentures removed? Not applicable  Mask Size Large  Set Rate 15 breaths/min  Respiratory Rate 23 breaths/min  Pressure Support 10 cmH20 (PC)  PEEP 5 cmH20  FiO2 (%) 40 %  Minute Ventilation 15.9  Leak 31  Peak Inspiratory Pressure (PIP) 8  Tidal Volume (Vt) 562  Patient Home Machine No  Patient Home Mask No  Patient Home Tubing No  Auto Titrate No  Press High Alarm 25 cmH2O  CPAP/SIPAP surface wiped down Yes  Device Plugged into RED Power Outlet Yes

## 2024-11-06 NOTE — ED Notes (Addendum)
 Respiratory called to administer nebulizer treatments while pt is on Bi pap

## 2024-11-06 NOTE — Progress Notes (Signed)
 RT transported pt via Bipap  from room WA6 to WA16 without ant issues

## 2024-11-07 ENCOUNTER — Inpatient Hospital Stay (HOSPITAL_COMMUNITY)

## 2024-11-07 LAB — MAGNESIUM: Magnesium: 2.4 mg/dL (ref 1.7–2.4)

## 2024-11-07 LAB — RENAL FUNCTION PANEL
Albumin: 3.6 g/dL (ref 3.5–5.0)
Anion gap: 10 (ref 5–15)
BUN: 20 mg/dL (ref 6–20)
CO2: 28 mmol/L (ref 22–32)
Calcium: 8.9 mg/dL (ref 8.9–10.3)
Chloride: 103 mmol/L (ref 98–111)
Creatinine, Ser: 0.77 mg/dL (ref 0.44–1.00)
GFR, Estimated: >60 mL/min (ref >60)
Glucose, Bld: 140 mg/dL — ABNORMAL HIGH (ref 70–99)
Phosphorus: 3.9 mg/dL (ref 2.5–4.6)
Potassium: 3.7 mmol/L (ref 3.5–5.1)
Sodium: 140 mmol/L (ref 135–145)

## 2024-11-07 LAB — ECHOCARDIOGRAM COMPLETE
Area-P 1/2: 3.74 cm2
Height: 67 in
S' Lateral: 4.4 cm
Weight: 5040 [oz_av]

## 2024-11-07 LAB — CBC
HCT: 37.3 % (ref 36.0–46.0)
Hemoglobin: 11.8 g/dL — ABNORMAL LOW (ref 12.0–15.0)
MCH: 32.2 pg (ref 26.0–34.0)
MCHC: 31.6 g/dL (ref 30.0–36.0)
MCV: 101.9 fL — ABNORMAL HIGH (ref 80.0–100.0)
Platelets: 300 K/uL (ref 150–400)
RBC: 3.66 MIL/uL — ABNORMAL LOW (ref 3.87–5.11)
RDW: 13.8 % (ref 11.5–15.5)
WBC: 9.1 K/uL (ref 4.0–10.5)
nRBC: 0 % (ref 0.0–0.2)

## 2024-11-07 LAB — GLUCOSE, CAPILLARY
Glucose-Capillary: 149 mg/dL — ABNORMAL HIGH (ref 70–99)
Glucose-Capillary: 154 mg/dL — ABNORMAL HIGH (ref 70–99)
Glucose-Capillary: 210 mg/dL — ABNORMAL HIGH (ref 70–99)
Glucose-Capillary: 256 mg/dL — ABNORMAL HIGH (ref 70–99)
Glucose-Capillary: 291 mg/dL — ABNORMAL HIGH (ref 70–99)

## 2024-11-07 MED ORDER — OXYCODONE-ACETAMINOPHEN 5-325 MG PO TABS
1.0000 | ORAL_TABLET | Freq: Four times a day (QID) | ORAL | Status: DC | PRN
  Administered 2024-11-07 – 2024-11-08 (×4): 1 via ORAL
  Filled 2024-11-07 (×4): qty 1

## 2024-11-07 MED ORDER — SPIRONOLACTONE 25 MG PO TABS
50.0000 mg | ORAL_TABLET | Freq: Every day | ORAL | Status: DC
  Administered 2024-11-07 – 2024-11-12 (×6): 50 mg via ORAL
  Filled 2024-11-07 (×6): qty 2

## 2024-11-07 MED ORDER — METHOCARBAMOL 500 MG PO TABS
500.0000 mg | ORAL_TABLET | Freq: Three times a day (TID) | ORAL | Status: DC | PRN
  Administered 2024-11-07 – 2024-11-17 (×19): 500 mg via ORAL
  Filled 2024-11-07 (×21): qty 1

## 2024-11-07 MED ORDER — PERFLUTREN LIPID MICROSPHERE
1.0000 mL | INTRAVENOUS | Status: AC | PRN
  Administered 2024-11-07: 11:00:00 2 mL via INTRAVENOUS

## 2024-11-07 MED ORDER — TRAZODONE HCL 50 MG PO TABS
25.0000 mg | ORAL_TABLET | Freq: Once | ORAL | Status: AC
  Administered 2024-11-07: 21:00:00 25 mg via ORAL
  Filled 2024-11-07: qty 1

## 2024-11-07 MED ORDER — ENOXAPARIN SODIUM 80 MG/0.8ML IJ SOSY
70.0000 mg | PREFILLED_SYRINGE | INTRAMUSCULAR | Status: DC
  Administered 2024-11-07 – 2024-11-13 (×7): 70 mg via SUBCUTANEOUS
  Filled 2024-11-07 (×7): qty 0.8

## 2024-11-07 MED ORDER — POTASSIUM CHLORIDE CRYS ER 20 MEQ PO TBCR
40.0000 meq | EXTENDED_RELEASE_TABLET | Freq: Once | ORAL | Status: AC
  Administered 2024-11-07: 11:00:00 40 meq via ORAL
  Filled 2024-11-07: qty 2

## 2024-11-07 NOTE — Progress Notes (Signed)
 PROGRESS NOTE  Sierra Atkins FMW:[id number] DOB: 28-Mar-1966   PCP: Default, Provider, MD  Patient is from: Home.  Uses rolling walker at baseline.  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   In ED, SBP ranges from 150s to 180s.  VBG without significant finding.  CBC and labs without significant finding.  proBNP and serial troponin negative.  CT head without acute finding.  CT angio chest negative for PE but showed mild cardiomegaly and signs of pulmonary hypertension.  Patient was started on BiPAP, IV Lasix , steroid and nebulizers and admitted.  Breathing and edema improved with IV Lasix .    Subjective: Seen and examined earlier this morning.  No major events overnight or this morning.  Reports improvement in her breathing and edema.  She had 1.3 L urine during the day yesterday.  No urine output culture from overnight.  Her main complaint today is about back pain and pain medication.  She has been on Percocet 5/325 every 8 hours since admission.  She says she was discharged from nursing home in [place] on 10/04/2024 with one month supply of oxycodone  15 mg every 6 hours (not a narcotic database).  She is also asking for one-time IV Dilaudid .  She is very angry and dismissive despite my attempt to explain the risk of high opiate with her underlying breathing issue that could potentially lead to intubation. She says she is an [employer] and likes to have another doctor. She curses son of bi**   Assessment and plan: Dyspnea and dyspnea with exertion:  Most likely CHF exacerbation since it is improving with IV diuretics.  Other potential etiologies include untreated OSA/OHS, PAH and COPD. Reports history of OSA but never used CPAP.  CT angio chest negative for PE or infiltrate but suggested PAH.  It is difficult to determine fluid status given her body habitus but edema has clearly improved.  Breathing has improved as well. - Follow echocardiogram-hoping to have a good picture of right heart pressure. - Continue diuretics, steroid, nebulizers - BiPAP as needed. - Minimum oxygen to keep saturation above 88% due to risk of CO2 retention. - Wean oxygen to room air.  Ambulatory saturation. -Minimize sedating medications.   Acute on chronic diastolic CHF -TTE 07/2024 with LVEF of 65 to 70% and mild DD but unable to assess RV function.  She is on Lasix  and Aldactone  at home.   Serial troponin and BNP negative.  CT angio chest with mild cardiomegaly and possible PAH. Difficult to assess fluid status due to body habitus but edema has greatly improved with diuretics.  Also reports improvement in her breathing.  She had 1.3 L UOP yesterday.  UOP was not captured from overnight.  BP and renal function is stable. -Continue IV Lasix  60 mg every 12 hours. -Follow echocardiogram -Strict intake and output, daily weight, renal functions and electrolytes.  Possible COPD exacerbation -never diagnosed, but she is still smoking and has smoked for a while.  Had wheezing on presentation.  Head exam is very limited due to weakness and body habitus.  -  Continue steroid, nebulizers, BiPAP and supplemental oxygen as above -Doubt need for antibiotics. -Encourage smoking cessation.   Essential hypertension: Does not seem to be medication at home.  Normotensive.  Takes Lasix  and Aldactone . -Diuretics as above.   OSA/OHS: Probably the main problem.  Patient reports history of OSA but never wore CPAP -Needs outpatient follow-up in sleep clinic -BiPAP as needed.    History  of lupus: On Plaquenil  and methotrexate  at home. - Continue home meds   Tobacco use-smoking about 5 cigarettes/day.   -Counseled for cessation  Depression: Stable.  On Cymbalta . - Continue Cymbalta .  Hyperglycemia: No history of diabetes.  Likely due to steroid. - Check hemoglobin A1c - SSI-moderate  Chronic back pain/osteoarthritis/DDD: Continues to complain about her pain management and pain medications.  She has been on p.o. Percocet 5/325mg  every 8 hours as needed since admission.  She states this is not helping.  She reports taking oxycodone  15 mg every 6 hours at home.  She states she was discharged from SNF with 30-day supply on 10/04/2024.  She is also asking for one-time IV Dilaudid .  Review of narcotic database does not confirm this.  She is angry and dismissive when we discussed about the risk specially with her tenuous breathing.  She likes to have another doctor.  I have notified charge nurse about patient's concern and request.  -Continue Tylenol  as needed -Increased Percocet Percocet from every 8 hours to every 6 hours as needed. -Added Robaxin  500 mg every 8 hours as needed -Continue home Cymbalta . -PT/OT eval  Morbid obesity Body mass index is 49.34 kg/m.           DVT prophylaxis:  enoxaparin  (LOVENOX ) injection 40 mg Start: 11/05/24 2200 Place TED hose Start: 11/05/24 2156  Code Status: Full code Family Communication: Due to respiratory distress Level of care: Progressive Status is: Inpatient The patient will remain inpatient because: Respiratory distress requiring BiPAP, acute CHF   Final disposition: To be determined   55 minutes with more than 50% spent in reviewing records, counseling patient/family and coordinating care.  Consultants:  None  Procedures: None  Microbiology summarized: None  Objective: Vitals:   11/07/24 0019 11/07/24 0333 11/07/24 0939 11/07/24 0942  BP: 106/63 132/81    Pulse: 83 73    Resp: 19 19    Temp: 98.6 F  (37 C) 98.6 F (37 C)    TempSrc: Oral Oral    SpO2: 100% 100% 97% 97%  Weight:      Height:        Examination:  GENERAL: No apparent distress.  Nontoxic. HEENT: MMM.  Vision and hearing grossly intact.  NECK: Supple.  No apparent JVD.  MSK/EXT:  Moves extremities. No apparent deformity.  Trace BLE edema. NEURO: AA.  Oriented appropriately.  No apparent focal neuro deficit. PSYCH: Angry about her pain management.  Of note, patient did not give me a chance to cardiopulmonary exam.  Sch Meds:  Scheduled Meds:  arformoterol   15 mcg Nebulization BID   budesonide  (PULMICORT ) nebulizer solution  0.25 mg Nebulization BID   DULoxetine   60 mg Oral BID   enoxaparin  (LOVENOX ) injection  40 mg Subcutaneous Q24H   feeding supplement  237 mL Oral BID BM   furosemide   60 mg Intravenous Q12H   gabapentin   300 mg Oral TID   insulin  aspart  0-15 Units Subcutaneous TID WC   predniSONE   40 mg Oral Q breakfast   Continuous Infusions: PRN Meds:.acetaminophen  **OR** acetaminophen , ipratropium-albuterol , methocarbamol , ondansetron  **  OR** ondansetron  (ZOFRAN ) IV, oxyCODONE -acetaminophen   Antimicrobials: Anti-infectives (From admission, onward)    Start     Dose/Rate Route Frequency Ordered Stop   11/05/24 2200  hydroxychloroquine  (PLAQUENIL ) tablet 200 mg  Status:  Discontinued        200 mg Oral Daily 11/05/24 2155 11/06/24 1640   11/05/24 2030  ceFAZolin  (ANCEF ) IVPB 2g/100 mL premix        2 g 200 mL/hr over 30 Minutes Intravenous  Once 11/05/24 2025 11/05/24 2147        I have personally reviewed the following labs and images: CBC: Recent Labs  Lab 11/05/24 1431 11/06/24 0448 11/07/24 0413  WBC 9.6 10.7* 9.1  HGB 12.6 13.2 11.8*  HCT 38.6 41.8 37.3  MCV 100.3* 102.0* 101.9*  PLT 234 340 300   BMP &GFR Recent Labs  Lab 11/05/24 1650 11/06/24 0833 11/07/24 0413  NA 143 140 140  K 3.6 4.1 3.7  CL 108 103 103  CO2 24 28 28   GLUCOSE 97 201* 140*  BUN 12 11 20    CREATININE 0.70 0.75 0.77  CALCIUM 8.9 8.9 8.9  MG  --  2.1 2.4  PHOS  --   --  3.9   Estimated Creatinine Clearance: 116.6 mL/min (by C-G formula based on SCr of 0.77 mg/dL). Liver & Pancreas: Recent Labs  Lab 11/05/24 1650 11/06/24 0833 11/07/24 0413  AST 16 15  --   ALT 11 10  --   ALKPHOS 89 102  --   BILITOT 0.2 0.2  --   PROT 7.0 7.6  --   ALBUMIN 3.7 3.9 3.6   No results for input(s): LIPASE, AMYLASE in the last 168 hours. Recent Labs  Lab 11/05/24 1650  AMMONIA 17   Diabetic: Recent Labs    11/06/24 1204  HGBA1C 6.7*   Recent Labs  Lab 11/06/24 1139 11/06/24 1721 11/07/24 0014 11/07/24 0800  GLUCAP 262* 141* 149* 154*   Cardiac Enzymes: No results for input(s): CKTOTAL, CKMB, CKMBINDEX, TROPONINI in the last 168 hours. Recent Labs    11/05/24 1650  PROBNP 82.2   Coagulation Profile: No results for input(s): INR, PROTIME in the last 168 hours. Thyroid Function Tests: No results for input(s): TSH, T4TOTAL, FREET4, T3FREE, THYROIDAB in the last 72 hours. Lipid Profile: No results for input(s): CHOL, HDL, LDLCALC, TRIG, CHOLHDL, LDLDIRECT in the last 72 hours. Anemia Panel: No results for input(s): VITAMINB12, FOLATE, FERRITIN, TIBC, IRON, RETICCTPCT in the last 72 hours. Urine analysis:    Component Value Date/Time   COLORURINE YELLOW 12/05/2023 1724   APPEARANCEUR CLOUDY (A) 12/05/2023 1724   LABSPEC 1.025 12/05/2023 1724   PHURINE 6.5 12/05/2023 1724   GLUCOSEU NEGATIVE 12/05/2023 1724   HGBUR NEGATIVE 12/05/2023 1724   BILIRUBINUR NEGATIVE 12/05/2023 1724   KETONESUR NEGATIVE 12/05/2023 1724   PROTEINUR NEGATIVE 12/05/2023 1724   UROBILINOGEN 1.0 10/24/2011 0107   NITRITE NEGATIVE 12/05/2023 1724   LEUKOCYTESUR TRACE (A) 12/05/2023 1724   Sepsis Labs: Invalid input(s): PROCALCITONIN, LACTICIDVEN  Microbiology: No results found for this or any previous visit (from the past 240  hours).  Radiology Studies: No results found.     Esiquio Boesen T. Jennefer Kopp [facility]  If 7PM-7AM, please contact night-coverage [email or web address] 11/07/2024, 11:12 AM

## 2024-11-07 NOTE — Progress Notes (Signed)
°   11/07/24 2239  BiPAP/CPAP/SIPAP  BiPAP/CPAP/SIPAP Pt Type Adult  BiPAP/CPAP/SIPAP SERVO (air)  Mask Type Full face mask  Dentures removed? Not applicable  Mask Size Large  Set Rate 15 breaths/min  Respiratory Rate 21 breaths/min  Pressure Support 10 cmH20  PEEP 5 cmH20  FiO2 (%) 40 %  Minute Ventilation 9.9  Leak 21  Peak Inspiratory Pressure (PIP) 8  Tidal Volume (Vt) 431  Patient Home Machine No  Patient Home Mask No  Patient Home Tubing No  Auto Titrate No  Press High Alarm 25 cmH2O  CPAP/SIPAP surface wiped down Yes  Device Plugged into RED Power Outlet Yes  BiPAP/CPAP /SiPAP Vitals  Resp (!) 21  SpO2 98 %  MEWS Score/Color  MEWS Score 1  MEWS Score Color Green

## 2024-11-07 NOTE — Progress Notes (Signed)
 OT Cancellation Note  Patient Details Name: Sierra Atkins MRN: [id number] DOB: 02/04/1966   Cancelled Treatment:    Reason Eval/Treat Not Completed: Other (comment) (Pt having ultrasound; room set up with chair, nsg gave tylenol  however pt not willing to work with therapy until after she has oxy. Will attempt this pm as schedule allows.)  [facility] 11/07/2024, 11:31 AM Kreg Sink, OT/L   Acute OT Clinical Specialist Acute Rehabilitation Services Pager [phone] Office [phone]

## 2024-11-07 NOTE — Progress Notes (Signed)
°  Echocardiogram 2D Echocardiogram has been performed.  Tinnie FORBES Gosling RDCS 11/07/2024, 11:22 AM

## 2024-11-07 NOTE — Evaluation (Signed)
 Occupational Therapy Evaluation Patient Details Name: Sierra Atkins MRN: [id number] DOB: [birth year] Today's Date: 11/07/2024   History of Present Illness   58 y.o. female with medical history significant of morbid obesity, lupus, brain aneurysm with hemorrhage in 2019 with a shunt in place, hypertension, arthritis, tobacco use comes to the hospital with complaints of shortness of breath and leg swelling     Clinical Impressions Pt premedicated and willing to participate. PTA pt had been home from rehab @ SNF since mid November. Since that time, pt has had a PCA several hours/day to assist with ADL/IADL tasks, but states she was able to complete her basic self care and mobilize with her rollator. Pt had 1 fall PTA (fell out of bed), requiring lifting assistance from the fire department. Pt currently requires mod A +2 for stand pivot transfers to chair @ RW level. Overall Max A with LB ADL tasks due to generalized weakness, soreness from fall and body habitus. SpO2 96 on 3L with functional transfers. At this time feel Patient will benefit from continued inpatient follow up therapy, <3 hours/day to maximize functional level of independence with goal to return home. Acute OT to follow.  On entry to room, pt offset toward L side of bed due to bed air pump not fully engaged. Pt/bed soaked in urine as purewick apparently not working. Nsg and NT made aware and a new bed was delivered to the pt's room.      If plan is discharge home, recommend the following:   A lot of help with walking and/or transfers;A lot of help with bathing/dressing/bathroom;Assistance with cooking/housework;Assist for transportation     Functional Status Assessment   Patient has had a recent decline in their functional status and demonstrates the ability to make significant improvements in function in a reasonable and predictable amount of time.     Equipment Recommendations   Tub/shower seat;BSC/3in1  (bariactric)     Recommendations for Other Services         Precautions/Restrictions   Restrictions Weight Bearing Restrictions Per Provider Order: No     Mobility Bed Mobility Overal bed mobility: Needs Assistance Bed Mobility: Supine to Sit     Supine to sit: Mod assist, +2 for physical assistance     General bed mobility comments: increased assitsance due to air mattress    Transfers Overall transfer level: Needs assistance Equipment used: Rolling walker (2 wheels) Transfers: Sit to/from Stand, Bed to chair/wheelchair/BSC Sit to Stand: Mod assist, +2 physical assistance     Step pivot transfers: Min assist, +2 physical assistance            Balance Overall balance assessment: History of Falls, Needs assistance   Sitting balance-Leahy Scale: Fair       Standing balance-Leahy Scale: Poor                             ADL either performed or assessed with clinical judgement   ADL Overall ADL's : Needs assistance/impaired Eating/Feeding: Independent   Grooming: Set up;Sitting   Upper Body Bathing: Set up;Sitting   Lower Body Bathing: Maximal assistance;Sit to/from stand   Upper Body Dressing : Set up;Sitting   Lower Body Dressing: Maximal assistance;Sit to/from stand   Toilet Transfer: Moderate assistance;+2 for physical assistance;Stand-pivot   Toileting- Clothing Manipulation and Hygiene: Total assistance    Unable to release RW to assist with pericare; states she has diffiuculty shifting weight and spreading her legs in  standing because her hips are bone on bone   Functional mobility during ADLs: Moderate assistance;Rolling walker (2 wheels) (bari)       Vision         Perception         Praxis         Pertinent Vitals/Pain Pain Assessment Pain Assessment: 0-10 Pain Score: 4  Pain Location: B hips Pain Descriptors / Indicators: Aching, Discomfort Pain Intervention(s): Limited activity within patient's  tolerance, Premedicated before session     Extremity/Trunk Assessment Upper Extremity Assessment Upper Extremity Assessment: Overall WFL for tasks assessed (c/o soreness from fall)   Lower Extremity Assessment Lower Extremity Assessment: Defer to PT evaluation (B hip pain; pt states she plans to have hip replacements but has to first lose weight)   Cervical / Trunk Assessment Cervical / Trunk Assessment: Other exceptions (increased body habitus)   Communication Communication Communication: No apparent difficulties   Cognition Arousal: Alert Behavior During Therapy: WFL for tasks assessed/performed Cognition: No apparent impairments                               Following commands: Intact       Cueing  General Comments          Exercises Exercises: Other exercises Other Exercises Other Exercises: encouraged chair level exercises   Shoulder Instructions      Home Living Family/patient expects to be discharged to:: Private residence Living Arrangements: Alone Available Help at Discharge: Family;Available PRN/intermittently Type of Home: Apartment Home Access: Level entry     Home Layout: One level     Bathroom Shower/Tub: Chief Strategy Officer: Standard Bathroom Accessibility: Yes How Accessible: Accessible via walker Home Equipment: Cane - single point;Rollator (4 wheels)          Prior Functioning/Environment Prior Level of Function : Needs assist             Mobility Comments: recently  ambulating with rollator ADLs Comments: able to complete basic ADL tasks; PCA several hours/day to assist with ITADL and ADL if needed    OT Problem List: Decreased strength;Decreased range of motion;Decreased activity tolerance;Impaired balance (sitting and/or standing);Decreased safety awareness;Cardiopulmonary status limiting activity;Obesity;Pain;Increased edema   OT Treatment/Interventions: Self-care/ADL training;Therapeutic  exercise;Energy conservation;DME and/or AE instruction;Therapeutic activities;Patient/family education;Balance training      OT Goals(Current goals can be found in the care plan section)   Acute Rehab OT Goals Patient Stated Goal: get rehab before her hip surgery OT Goal Formulation: With patient Time For Goal Achievement: 11/21/24 Potential to Achieve Goals: Good   OT Frequency:  Min 2X/week    Co-evaluation              AM-PAC OT 6 Clicks Daily Activity     Outcome Measure Help from another person eating meals?: None Help from another person taking care of personal grooming?: A Little Help from another person toileting, which includes using toliet, bedpan, or urinal?: Total Help from another person bathing (including washing, rinsing, drying)?: A Lot Help from another person to put on and taking off regular upper body clothing?: A Little Help from another person to put on and taking off regular lower body clothing?: A Lot 6 Click Score: 15   End of Session Equipment Utilized During Treatment: Gait belt;Rolling walker (2 wheels);Oxygen (3L) Nurse Communication: Mobility status  Activity Tolerance: Patient limited by pain;Patient limited by fatigue Patient left: in chair;with call bell/phone  within reach;with chair alarm set  OT Visit Diagnosis: Unsteadiness on feet (R26.81);Other abnormalities of gait and mobility (R26.89);Muscle weakness (generalized) (M62.81);History of falling (Z91.81);Pain Pain - Right/Left:  (B) Pain - part of body: Hip                Time: 1344-1420 OT Time Calculation (min): 36 min Charges:  OT General Charges $OT Visit: 1 Visit OT Evaluation $OT Eval Moderate Complexity: 1 Mod OT Treatments $Self Care/Home Management : 8-22 mins  Kreg Sink, OT/L   Acute OT Clinical Specialist Acute Rehabilitation Services Pager [phone] Office [phone]   [facility] 11/07/2024, 2:35 PM

## 2024-11-07 NOTE — Progress Notes (Signed)
 PT Cancellation Note  Patient Details Name: Sierra Atkins MRN: [id number] DOB: [birth year]   Cancelled Treatment:    Reason Eval/Treat Not Completed: Other (comment). Pt declined PT d/t wanting pain meds (only tylenol  available), attempted again after pt received meds, pt stating that she got rid of her MD and is waiting on a new one. Again declines PT at this time stating she wants oxy first. Will attempt again as schedule allows   [facility] 11/07/2024, 12:04 PM

## 2024-11-08 DIAGNOSIS — R0602 Shortness of breath: Secondary | ICD-10-CM | POA: Diagnosis not present

## 2024-11-08 LAB — CBC
HCT: 37.5 % (ref 36.0–46.0)
Hemoglobin: 12.4 g/dL (ref 12.0–15.0)
MCH: 32.6 pg (ref 26.0–34.0)
MCHC: 33.1 g/dL (ref 30.0–36.0)
MCV: 98.7 fL (ref 80.0–100.0)
Platelets: 348 K/uL (ref 150–400)
RBC: 3.8 MIL/uL — ABNORMAL LOW (ref 3.87–5.11)
RDW: 13.3 % (ref 11.5–15.5)
WBC: 9.5 K/uL (ref 4.0–10.5)
nRBC: 0 % (ref 0.0–0.2)

## 2024-11-08 LAB — GLUCOSE, CAPILLARY
Glucose-Capillary: 145 mg/dL — ABNORMAL HIGH (ref 70–99)
Glucose-Capillary: 270 mg/dL — ABNORMAL HIGH (ref 70–99)
Glucose-Capillary: 226 mg/dL — ABNORMAL HIGH (ref 70–99)
Glucose-Capillary: 193 mg/dL — ABNORMAL HIGH (ref 70–99)

## 2024-11-08 LAB — RENAL FUNCTION PANEL
Albumin: 3.8 g/dL (ref 3.5–5.0)
Anion gap: 7 (ref 5–15)
BUN: 23 mg/dL — ABNORMAL HIGH (ref 6–20)
CO2: 32 mmol/L (ref 22–32)
Calcium: 8.9 mg/dL (ref 8.9–10.3)
Chloride: 99 mmol/L (ref 98–111)
Creatinine, Ser: 0.79 mg/dL (ref 0.44–1.00)
GFR, Estimated: >60 mL/min (ref >60)
Glucose, Bld: 135 mg/dL — ABNORMAL HIGH (ref 70–99)
Phosphorus: 3.3 mg/dL (ref 2.5–4.6)
Potassium: 4.6 mmol/L (ref 3.5–5.1)
Sodium: 139 mmol/L (ref 135–145)

## 2024-11-08 LAB — MAGNESIUM: Magnesium: 2 mg/dL (ref 1.7–2.4)

## 2024-11-08 MED ORDER — HYDROXYCHLOROQUINE SULFATE 200 MG PO TABS
200.0000 mg | ORAL_TABLET | Freq: Every day | ORAL | Status: DC
  Administered 2024-11-08 – 2024-11-17 (×10): 200 mg via ORAL
  Filled 2024-11-08 (×10): qty 1

## 2024-11-08 MED ORDER — PREDNISONE 20 MG PO TABS
20.0000 mg | ORAL_TABLET | Freq: Every day | ORAL | Status: DC
  Administered 2024-11-09: 08:00:00 20 mg via ORAL
  Filled 2024-11-08: qty 1

## 2024-11-08 MED ORDER — OXYCODONE-ACETAMINOPHEN 5-325 MG PO TABS: 2.0000 | ORAL_TABLET | ORAL | Status: DC | PRN

## 2024-11-08 MED ORDER — FUROSEMIDE 40 MG PO TABS
40.0000 mg | ORAL_TABLET | Freq: Every day | ORAL | Status: DC
  Administered 2024-11-08 – 2024-11-09 (×2): 40 mg via ORAL
  Filled 2024-11-08 (×2): qty 1

## 2024-11-08 MED ORDER — OXYCODONE-ACETAMINOPHEN 5-325 MG PO TABS
2.0000 | ORAL_TABLET | Freq: Four times a day (QID) | ORAL | Status: DC | PRN
  Administered 2024-11-08 – 2024-11-09 (×3): 2 via ORAL
  Filled 2024-11-08 (×3): qty 2

## 2024-11-08 MED ORDER — ZOLPIDEM TARTRATE 5 MG PO TABS
5.0000 mg | ORAL_TABLET | Freq: Every evening | ORAL | Status: DC | PRN
  Administered 2024-11-08 – 2024-11-16 (×9): 5 mg via ORAL
  Filled 2024-11-08 (×9): qty 1

## 2024-11-08 NOTE — Progress Notes (Signed)
 PT Cancellation Note  Patient Details Name: Sierra Atkins MRN: [id number] DOB: 1966/01/21   Cancelled Treatment:    Reason Eval/Treat Not Completed: Fatigue/lethargy limiting ability to participate Attempted PT eval-pt declined participation 2* fatigue-reports she had just walked with nursing. Will check back on tomorrow.    Dannial SQUIBB, PT Acute Rehabilitation  Office: [phone]

## 2024-11-08 NOTE — Progress Notes (Signed)
 [facility] Progress Note  Patient: Sierra Atkins     FMW:[id number]  DOA: 11/05/2024   PCP: Default, Provider, MD       Brief hospital course: This is a 58 year old with SLE, brain aneurysm rupture, pseudotumor cerebri status post VP shunt, tobacco abuse, morbid obesity who presents to the hospital for shortness of breath.  She was noted to have wheezing on evaluation.  While in the hospital, she was noted to be somnolent and started on a BiPAP.  Working diagnosis was CHF exacerbation versus COPD exacerbation in the setting of pulmonary hide she was treated with IV Lasix , nebulizer treatments, steroids and has improved.  Subjective:  Complains of pain in hips and asking to increase oxycodone .  Assessment and Plan: Principal Problem:   Dyspnea -Multifactorial and likely secondary to acute diastolic heart failure in the setting of undiagnosed COPD with exacerbation, sleep apnea, obesity hypoventilation and pulmonary hypertension - 2D echo does not reveal any systolic dysfunction.  She has grade 1 diastolic dysfunction with a dilated LV-RV not well-visualized - She was started on Lasix  60 mg IV twice daily - Takes 10 mg of prednisone  at baseline-this was increased to 40 mg daily - Dyspnea has improved - Pulse ox is about 90 to 95% on room air when she ambulates - Will reduce Lasix  and prednisone  today - Continue Aldactone  at current dose - Continue nebulizer treatments - We have discussed that she may have sleep apnea and she states that her sleep study scheduled already  Chronic pedal edema - She has venous stasis edema - I have recommended that she elevate her legs  Chronic pain - The patient has repeatedly asked for an increase in her oxycodone  dose and states that she needs both hips replaced but has been asked to lose weight before this can be done - At this time she is weak, having trouble ambulating and a skilled nursing facility - I have increased her oxycodone   from 5 to 10 mg and recommended that she find an outpatient pain management physician - Interestingly, despite having pain for years, she does not have a pain management physician  SLE - Continue prednisone  and Plaquenil   Insomnia - She has a prescription for Ambien -will continue this  Hypertension - Continue carvedilol , spironolactone  and Lasix   Obesity class III Body mass index is 49.13 kg/m. Gordon Israel    Code Status: Full Code Total time on patient care: 35 minutes DVT prophylaxis:  Place TED hose Start: 11/05/24 2156     Objective:   Vitals:   11/08/24 0500 11/08/24 0837 11/08/24 0839 11/08/24 1354  BP:    125/79  Pulse:    95  Resp:    20  Temp:    98.4 F (36.9 C)  TempSrc:    Oral  SpO2:  95% 95% 96%  Weight: (!) 142.3 kg     Height:       Filed Weights   11/05/24 1142 11/08/24 0500  Weight: (!) 142.9 kg (!) 142.3 kg   Exam: General exam: Appears comfortable  HEENT: oral mucosa moist Respiratory system: Clear to auscultation.  Breath sounds are difficult to auscultate because of body habitus Cardiovascular system: S1 & S2 heard  Gastrointestinal system: Abdomen soft, non-tender, nondistended. Normal bowel sounds   Extremities: No cyanosis, clubbing-has moderate pedal edema Psychiatry:  Mood & affect appropriate.      CBC: Recent Labs  Lab 11/05/24 1431 11/06/24 0448 11/07/24 0413 11/08/24 0344  WBC 9.6 10.7* 9.1 9.5  HGB  12.6 13.2 11.8* 12.4  HCT 38.6 41.8 37.3 37.5  MCV 100.3* 102.0* 101.9* 98.7  PLT 234 340 300 348   Basic Metabolic Panel: Recent Labs  Lab 11/05/24 1650 11/06/24 0833 11/07/24 0413 11/08/24 0344 11/08/24 0355  NA 143 140 140  --  139  K 3.6 4.1 3.7  --  4.6  CL 108 103 103  --  99  CO2 24 28 28   --  32  GLUCOSE 97 201* 140*  --  135*  BUN 12 11 20   --  23*  CREATININE 0.70 0.75 0.77  --  0.79  CALCIUM 8.9 8.9 8.9  --  8.9  MG  --  2.1 2.4 2.0  --   PHOS  --   --  3.9  --  3.3     Scheduled Meds:   arformoterol   15 mcg Nebulization BID   budesonide  (PULMICORT ) nebulizer solution  0.25 mg Nebulization BID   DULoxetine   60 mg Oral BID   enoxaparin  (LOVENOX ) injection  70 mg Subcutaneous Q24H   feeding supplement  237 mL Oral BID BM   gabapentin   300 mg Oral TID   hydroxychloroquine   200 mg Oral Daily   insulin  aspart  0-15 Units Subcutaneous TID WC   [START ON 11/09/2024] predniSONE   20 mg Oral Q breakfast   spironolactone   50 mg Oral Daily    Imaging and lab data personally reviewed   Author: Jasmeet Gehl  11/08/2024 4:57 PM  To contact [facility]>   Check the care team in [facility] and look for the attending/consulting [facility] provider listed  Log into [email or web address] and use [place]'s universal password   Go to> [facility]  and find provider  If you still have difficulty reaching the provider, please page the [facility] (Director on Call) for the Hospitalists listed on amion

## 2024-11-08 NOTE — Inpatient Diabetes Management (Signed)
 Inpatient Diabetes Program Recommendations  AACE/ADA: New Consensus Statement on Inpatient Glycemic Control (2015)  Target Ranges:  Prepandial:   less than 140 mg/dL      Peak postprandial:   less than 180 mg/dL (1-2 hours)      Critically ill patients:  140 - 180 mg/dL   Lab Results  Component Value Date   GLUCAP 145 (H) 11/08/2024   HGBA1C 6.7 (H) 11/06/2024    Review of Glycemic Control  Latest Reference Range & Units 11/07/24 08:00 11/07/24 11:44 11/07/24 15:50 11/07/24 19:59 11/08/24 07:26  Glucose-Capillary 70 - 99 mg/dL 845 (H) 789 (H) 708 (H) 256 (H) 145 (H)   Current orders for Inpatient glycemic control:  Novolog  0-15 units tid   Ensure Plus High Protein (19 grams of carbs) PO prednisone  40 mg Daily Note: glucose trends increase after PO intake and steroid dose  Inpatient Diabetes Program Recommendations:    -   Consider adding Novolog  4 units tid meal coverage if eating >50% of meals/supplements and while on steroids -   Add Novolog  HS scale  Thanks,  Clotilda Bull RN, MSN, BC-ADM Inpatient Diabetes Coordinator Team Pager [phone] (8a-5p)

## 2024-11-08 NOTE — Plan of Care (Signed)
°  Problem: Education: Goal: Ability to describe self-care measures that may prevent or decrease complications (Diabetes Survival Skills Education) will improve Outcome: Progressing   Problem: Coping: Goal: Ability to adjust to condition or change in health will improve Outcome: Progressing   Problem: Health Behavior/Discharge Planning: Goal: Ability to identify and utilize available resources and services will improve Outcome: Progressing   Problem: Nutritional: Goal: Maintenance of adequate nutrition will improve Outcome: Progressing   Problem: Skin Integrity: Goal: Risk for impaired skin integrity will decrease Outcome: Progressing   Problem: Tissue Perfusion: Goal: Adequacy of tissue perfusion will improve Outcome: Progressing   Problem: Education: Goal: Knowledge of General Education information will improve Description: Including pain rating scale, medication(s)/side effects and non-pharmacologic comfort measures Outcome: Progressing   Problem: Health Behavior/Discharge Planning: Goal: Ability to manage health-related needs will improve Outcome: Progressing   Problem: Clinical Measurements: Goal: Ability to maintain clinical measurements within normal limits will improve Outcome: Progressing Goal: Will remain free from infection Outcome: Progressing Goal: Respiratory complications will improve Outcome: Progressing Goal: Cardiovascular complication will be avoided Outcome: Progressing   Problem: Activity: Goal: Risk for activity intolerance will decrease Outcome: Progressing   Problem: Nutrition: Goal: Adequate nutrition will be maintained Outcome: Progressing   Problem: Coping: Goal: Level of anxiety will decrease Outcome: Progressing   Problem: Elimination: Goal: Will not experience complications related to urinary retention Outcome: Progressing   Problem: Pain Managment: Goal: General experience of comfort will improve and/or be controlled Outcome:  Progressing   Problem: Safety: Goal: Ability to remain free from injury will improve Outcome: Progressing   Problem: Skin Integrity: Goal: Risk for impaired skin integrity will decrease Outcome: Progressing

## 2024-11-08 NOTE — Plan of Care (Signed)

## 2024-11-09 DIAGNOSIS — R0602 Shortness of breath: Secondary | ICD-10-CM | POA: Diagnosis not present

## 2024-11-09 LAB — BLOOD GAS, VENOUS
Acid-Base Excess: 10.4 mmol/L — ABNORMAL HIGH (ref 0.0–2.0)
Bicarbonate: 37 mmol/L — ABNORMAL HIGH (ref 20.0–28.0)
Drawn by: 35129
O2 Saturation: 83.2 %
Patient temperature: 36.9
pCO2, Ven: 57 mmHg (ref 44–60)
pH, Ven: 7.42 (ref 7.25–7.43)
pO2, Ven: 48 mmHg — ABNORMAL HIGH (ref 32–45)

## 2024-11-09 LAB — GLUCOSE, CAPILLARY
Glucose-Capillary: 131 mg/dL — ABNORMAL HIGH (ref 70–99)
Glucose-Capillary: 237 mg/dL — ABNORMAL HIGH (ref 70–99)
Glucose-Capillary: 330 mg/dL — ABNORMAL HIGH (ref 70–99)
Glucose-Capillary: 227 mg/dL — ABNORMAL HIGH (ref 70–99)

## 2024-11-09 MED ORDER — PREDNISONE 10 MG PO TABS
10.0000 mg | ORAL_TABLET | Freq: Every day | ORAL | Status: DC
  Administered 2024-11-10 – 2024-11-17 (×8): 10 mg via ORAL
  Filled 2024-11-09 (×8): qty 1

## 2024-11-09 MED ORDER — HYDROXYZINE HCL 25 MG PO TABS
25.0000 mg | ORAL_TABLET | Freq: Once | ORAL | Status: AC
  Administered 2024-11-09: 25 mg via ORAL
  Filled 2024-11-09: qty 1

## 2024-11-09 MED ORDER — SENNOSIDES-DOCUSATE SODIUM 8.6-50 MG PO TABS
2.0000 | ORAL_TABLET | Freq: Once | ORAL | Status: AC
  Administered 2024-11-09: 10:00:00 2 via ORAL
  Filled 2024-11-09: qty 2

## 2024-11-09 MED ORDER — OXYCODONE-ACETAMINOPHEN 5-325 MG PO TABS
2.0000 | ORAL_TABLET | ORAL | Status: DC | PRN
  Administered 2024-11-09 – 2024-11-17 (×37): 2 via ORAL
  Filled 2024-11-09 (×37): qty 2

## 2024-11-09 NOTE — Progress Notes (Signed)
°   11/08/24 2355  BiPAP/CPAP/SIPAP  $ Non-Invasive Home Ventilator  Initial  BiPAP/CPAP/SIPAP Pt Type Adult  BiPAP/CPAP/SIPAP Resmed  Mask Type Full face mask  Dentures removed? Not applicable  Mask Size Large (same mask from prev bipap (L mask/XL strapgear))  FiO2 (%) 21 %  Patient Home Machine No  Patient Home Mask No  Patient Home Tubing No  Auto Titrate Yes  Minimum cmH2O 5 cmH2O  Maximum cmH2O 20 cmH2O  CPAP/SIPAP surface wiped down Yes  Device Plugged into [employer] Power Outlet Yes

## 2024-11-09 NOTE — Progress Notes (Signed)
 Spoke with patient regarding BiPAP usage tonight.  She states that she is unable to wear it unless she gets something for anxiety.  RN in contact with hospitalist coverage at this time regarding same.  Whether she gets anything for anxiety or not, I will speak with the patient again in an attempt to get her to wear it.

## 2024-11-09 NOTE — TOC Initial Note (Signed)
 Transition of Care [facility]) - Initial/Assessment Note   Patient Details  Name: Sierra Atkins MRN: [id number] Date of Birth: 02-19-1966  Transition of Care [facility]) CM/SW Contact:    [name] Aran, LCSW Phone Number: 11/09/2024, 3:15 PM  Clinical Narrative: PT/OT evaluations recommended SNF. Patient is agreeable to rehab, but requested that she not be referred to [facility]. FL2 done; PASRR confirmed. Initial referral faxed out in hub. Care management awaiting bed offers.  Expected Discharge Plan: Skilled Nursing Facility Barriers to Discharge: Continued Medical Work up, SNF Pending bed offer, Insurance Authorization  Patient Goals and CMS Choice Patient states their goals for this hospitalization and ongoing recovery are:: Go to rehab CMS Medicare.gov Compare Post Acute Care list provided to:: Patient Choice offered to / list presented to : Patient  Expected Discharge Plan and Services In-house Referral: Clinical Social Work Post Acute Care Choice: Skilled Nursing Facility Living arrangements for the past 2 months: Apartment           DME Arranged: N/A DME Agency: NA  Prior Living Arrangements/Services Living arrangements for the past 2 months: Apartment Patient language and need for interpreter reviewed:: Yes Do you feel safe going back to the place where you live?: Yes      Need for Family Participation in Patient Care: No (Comment) Care giver support system in place?: Yes (comment) Current home services: DME [facility]) Criminal Activity/Legal Involvement Pertinent to Current Situation/Hospitalization: No - Comment as needed  Activities of Daily Living ADL Screening (condition at time of admission) Independently performs ADLs?: No Does the patient have a NEW difficulty with bathing/dressing/toileting/self-feeding that is expected to last >3 days?: Yes (Initiates electronic notice to provider for possible OT consult) Does the patient have a NEW difficulty with getting in/out of  bed, walking, or climbing stairs that is expected to last >3 days?: Yes (Initiates electronic notice to provider for possible PT consult) Does the patient have a NEW difficulty with communication that is expected to last >3 days?: No Is the patient deaf or have difficulty hearing?: No Does the patient have difficulty seeing, even when wearing glasses/contacts?: Yes Does the patient have difficulty concentrating, remembering, or making decisions?: No  Permission Sought/Granted Permission sought to share information with : Facility Industrial/product Designer granted to share information with : Yes, Verbal Permission Granted Permission granted to share info w AGENCY: SNFs  Emotional Assessment Appearance:: Appears stated age Attitude/Demeanor/Rapport: Engaged Affect (typically observed): Accepting Orientation: : Oriented to Self, Oriented to Place, Oriented to  Time, Oriented to Situation Alcohol / Substance Use: Not Applicable Psych Involvement: No (comment)  Admission diagnosis:  Somnolence [R40.0] Dyspnea [R06.00] Acute respiratory failure with hypoxia (HCC) [J96.01] Dyspnea, unspecified type [R06.00] Patient Active Problem List   Diagnosis Date Noted   Dyspnea 11/05/2024   Hypokalemia 12/06/2023   Peripheral polyneuropathy 12/06/2023   Essential hypertension 12/06/2023   Lupus (systemic lupus erythematosus) (HCC) 12/06/2023   Cellulitis of left lower extremity 12/05/2023   PCP:  Default, Provider, MD Pharmacy:   [facility] - [place], [place] - [address] [place] [address] Phone: [phone] Fax: [phone]  Social Drivers of Health (SDOH) Social History: SDOH Screenings   Food Insecurity: No Food Insecurity (11/06/2024)  Housing: Low Risk (11/06/2024)  Transportation Needs: No Transportation Needs (11/06/2024)  Utilities: Not At Risk (11/06/2024)  Financial Resource Strain: Low Risk (12/27/2023)   Received from  [facility]  Physical Activity: Unknown (12/27/2023)   [facility]  Social  Connections: Socially Integrated (12/27/2023)   Received from [facility]  Stress: No Stress Concern Present (08/05/2024)   Received from [facility]  Tobacco Use: Medium Risk (11/05/2024)   SDOH Interventions:    Readmission Risk Interventions    12/06/2023   11:51 AM  Readmission Risk Prevention Plan  Transportation Screening Complete  PCP or Specialist Appt within 5-7 Days Complete  Home Care Screening Complete  Medication Review (RN CM) Complete

## 2024-11-09 NOTE — Evaluation (Signed)
 Physical Therapy Evaluation Patient Details Name: Sierra Atkins MRN: [id number] DOB: [birth year] Today's Date: 11/09/2024  History of Present Illness  58 y.o. female with medical history significant of morbid obesity, lupus, brain aneurysm with hemorrhage in 2019 with a shunt in place, hypertension, arthritis, tobacco use comes to the hospital with complaints of shortness of breath and leg swelling  Clinical Impression  Pt admitted with above diagnosis.  Pt currently with functional limitations due to the deficits listed below (see PT Problem List). Pt will benefit from acute skilled PT to increase their independence and safety with mobility to allow discharge.     Patient well known to PT from previous admissions.  Patient  min assist with bed mobility and transfers. Min assist to stand and step to recliner then  Min- mod assistance and +2  for stand and ambulating x 20' using RW. Patient eager to participate and progress mobility. SPO2 on RA 95%.  Patient will benefit from continued inpatient follow up therapy, <3 hours/day       If plan is discharge home, recommend the following: A lot of help with walking and/or transfers;A lot of help with bathing/dressing/bathroom;Assist for transportation;Help with stairs or ramp for entrance   Can travel by private vehicle        Equipment Recommendations None recommended by PT  Recommendations for Other Services       Functional Status Assessment Patient has had a recent decline in their functional status and demonstrates the ability to make significant improvements in function in a reasonable and predictable amount of time.     Precautions / Restrictions Precautions Precautions: Fall Restrictions Weight Bearing Restrictions Per Provider Order: No      Mobility  Bed Mobility   Bed Mobility: Supine to Sit     Supine to sit: Min assist     General bed mobility comments: use of rail, Pull with 1 UE to sitting     Transfers Overall transfer level: Needs assistance Equipment used: Rolling walker (2 wheels) Transfers: Sit to/from Stand, Bed to chair/wheelchair/BSC Sit to Stand: Min assist   Step pivot transfers: Min assist       General transfer comment: able to stand from bed with min assist and from recliner x 1 with min assist, cues to push from recliner.    Ambulation/Gait Ambulation/Gait assistance: Min /mod assist, +2 safety/equipment Gait Distance (Feet): 20 Feet Assistive device: Rolling walker (2 wheels) Gait Pattern/deviations: Step-to pattern, Wide base of support Gait velocity: decr     General Gait Details: Patient  eager to ambulate , +2 for safety  Stairs            Wheelchair Mobility     Tilt Bed    Modified Rankin (Stroke Patients Only)       Balance Overall balance assessment: History of Falls, Needs assistance   Sitting balance-Leahy Scale: Fair     Standing balance support: Bilateral upper extremity supported, During functional activity, Reliant on assistive device for balance Standing balance-Leahy Scale: Poor                               Pertinent Vitals/Pain Pain Assessment Pain Location: B hips Pain Descriptors / Indicators: Aching, Discomfort Pain Intervention(s): Monitored during session    Home Living Family/patient expects to be discharged to:: Private residence Living Arrangements: Alone Available Help at Discharge: Family;Available PRN/intermittently Type of Home: Apartment Home Access: Level entry  Home Layout: One level Home Equipment: Cane - single point;Rollator (4 wheels)      Prior Function Prior Level of Function : Needs assist             Mobility Comments: recently  ambulating with rollator ADLs Comments: able to complete basic ADL tasks; PCA several hours/day to assist with ITADL and ADL if needed     Extremity/Trunk Assessment   Upper Extremity Assessment Upper Extremity Assessment:  Overall WFL for tasks assessed    Lower Extremity Assessment Lower Extremity Assessment: Generalized weakness    Cervical / Trunk Assessment Cervical / Trunk Assessment: Other exceptions Cervical / Trunk Exceptions: baRIATIRC  Communication   Communication Communication: Impaired Factors Affecting Communication: Reduced clarity of speech (voice very hoarse and looses it to a whisper)    Cognition Arousal: Alert Behavior During Therapy: WFL for tasks assessed/performed   PT - Cognitive impairments: No apparent impairments                         Following commands: Intact       Cueing       General Comments      Exercises     Assessment/Plan    PT Assessment Patient needs continued PT services  PT Problem List Decreased strength;Pain;Decreased activity tolerance;Obesity;Decreased balance;Decreased mobility;Decreased knowledge of precautions       PT Treatment Interventions DME instruction;Gait training;Functional mobility training;Therapeutic activities;Patient/family education    PT Goals (Current goals can be found in the Care Plan section)  Acute Rehab PT Goals Patient Stated Goal: go to rehab PT Goal Formulation: With patient Time For Goal Achievement: 11/23/24 Potential to Achieve Goals: Fair    Frequency Min 2X/week     Co-evaluation               AM-PAC PT 6 Clicks Mobility  Outcome Measure Help needed turning from your back to your side while in a flat bed without using bedrails?: A Little Help needed moving from lying on your back to sitting on the side of a flat bed without using bedrails?: A Little Help needed moving to and from a bed to a chair (including a wheelchair)?: A Little Help needed standing up from a chair using your arms (e.g., wheelchair or bedside chair)?: A Lot Help needed to walk in hospital room?: A Lot Help needed climbing 3-5 steps with a railing? : Total 6 Click Score: 14    End of Session Equipment  Utilized During Treatment: Gait belt Activity Tolerance: Patient tolerated treatment well Patient left: in chair;with call bell/phone within reach;with chair alarm set Nurse Communication: Mobility status PT Visit Diagnosis: Unsteadiness on feet (R26.81);History of falling (Z91.81)    Time: 8781-8744 PT Time Calculation (min) (ACUTE ONLY): 37 min   Charges:   PT Evaluation $PT Eval Low Complexity: 1 Low PT Treatments $Gait Training: 8-22 mins PT General Charges $$ ACUTE PT VISIT: 1 Visit         [name] PT Acute Rehabilitation Services Office [phone]   [name] 11/09/2024, 1:41 PM

## 2024-11-09 NOTE — [place] FL2 (Signed)
 [place]  MEDICAID FL2 LEVEL OF CARE FORM     IDENTIFICATION  Patient Name: Sierra Atkins Birthdate: Sep 28, 1966 Sex: female Admission Date (Current Location): 11/05/2024  [place] Number:  [place] [id number] Facility and Address:  [facility],  [address], [place] [address]      Provider Number: [id number]  Attending Physician Name and Address:  Earley Saucer, MD  Relative Name and Phone Number:  Angee Gupton (daughter) Ph: [phone]    Current Level of Care: Hospital Recommended Level of Care: Skilled Nursing Facility Prior Approval Number:    Date Approved/Denied:   PASRR Number: [id number]  Discharge Plan: SNF    Current Diagnoses: Patient Active Problem List   Diagnosis Date Noted   Dyspnea 11/05/2024   Hypokalemia 12/06/2023   Peripheral polyneuropathy 12/06/2023   Essential hypertension 12/06/2023   Lupus (systemic lupus erythematosus) (HCC) 12/06/2023   Cellulitis of left lower extremity 12/05/2023    Orientation RESPIRATION BLADDER Height & Weight     Self, Time, Situation, Place  Normal Incontinent Weight: (!) 313 lb 0.9 oz (142 kg) Height:  5' 7 (170.2 cm)  BEHAVIORAL SYMPTOMS/MOOD NEUROLOGICAL BOWEL NUTRITION STATUS      Continent Diet (2 gram sodium diet, fluid restriction)  AMBULATORY STATUS COMMUNICATION OF NEEDS Skin   Extensive Assist Verbally Normal                       Personal Care Assistance Level of Assistance  Bathing, Feeding, Dressing Bathing Assistance: Limited assistance Feeding assistance: Independent Dressing Assistance: Limited assistance     Functional Limitations Info  Sight, Hearing, Speech Sight Info: Impaired Hearing Info: Adequate Speech Info: Adequate    SPECIAL CARE FACTORS FREQUENCY  PT (By licensed PT), OT (By licensed OT)     PT Frequency: 5x's/week OT Frequency: 5x's/week            Contractures Contractures Info: Not present    Additional  Factors Info  Code Status, Allergies Code Status Info: Full Allergies Info: Fentanyl, Morphine, Codeine           Current Medications (11/09/2024):  This is the current hospital active medication list Current Facility-Administered Medications  Medication Dose Route Frequency Provider Last Rate Last Admin   acetaminophen  (TYLENOL ) tablet 650 mg  650 mg Oral Q6H PRN Gherghe, Costin M, MD   650 mg at 11/07/24 2109   Or   acetaminophen  (TYLENOL ) suppository 650 mg  650 mg Rectal Q6H PRN Gherghe, Costin M, MD       arformoterol  (BROVANA ) nebulizer solution 15 mcg  15 mcg Nebulization BID Gherghe, Costin M, MD   15 mcg at 11/09/24 0810   budesonide  (PULMICORT ) nebulizer solution 0.25 mg  0.25 mg Nebulization BID Gherghe, Costin M, MD   0.25 mg at 11/09/24 9187   DULoxetine  (CYMBALTA ) DR capsule 60 mg  60 mg Oral BID Gherghe, Costin M, MD   60 mg at 11/09/24 1008   enoxaparin  (LOVENOX ) injection 70 mg  70 mg Subcutaneous Q24H Nicholaus Quarry, RPH   70 mg at 11/08/24 2210   feeding supplement (ENSURE PLUS HIGH PROTEIN) liquid 237 mL  237 mL Oral BID BM Gonfa, Taye T, MD       furosemide  (LASIX ) tablet 40 mg  40 mg Oral Daily Rizwan, Saima, MD   40 mg at 11/09/24 1008   gabapentin  (NEURONTIN ) capsule 300 mg  300 mg Oral TID Gherghe, Costin M, MD   300 mg at 11/09/24 1008  hydroxychloroquine  (PLAQUENIL ) tablet 200 mg  200 mg Oral Daily Rizwan, Saima, MD   200 mg at 11/09/24 1008   insulin  aspart (novoLOG ) injection 0-15 Units  0-15 Units Subcutaneous TID WC Gonfa, Taye T, MD   5 Units at 11/09/24 1137   ipratropium-albuterol  (DUONEB) 0.5-2.5 (3) MG/3ML nebulizer solution 3 mL  3 mL Nebulization Q4H PRN Gherghe, Costin M, MD   3 mL at 11/09/24 1127   methocarbamol  (ROBAXIN ) tablet 500 mg  500 mg Oral Q8H PRN Gonfa, Taye T, MD   500 mg at 11/09/24 9360   ondansetron  (ZOFRAN ) tablet 4 mg  4 mg Oral Q6H PRN Gherghe, Costin M, MD       Or   ondansetron  (ZOFRAN ) injection 4 mg  4 mg Intravenous Q6H PRN  Gherghe, Costin M, MD       oxyCODONE -acetaminophen  (PERCOCET/ROXICET) 5-325 MG per tablet 2 tablet  2 tablet Oral Q4H PRN Rizwan, Saima, MD   2 tablet at 11/09/24 1137   [START ON 11/10/2024] predniSONE  (DELTASONE ) tablet 10 mg  10 mg Oral Q breakfast Rizwan, Saima, MD       spironolactone  (ALDACTONE ) tablet 50 mg  50 mg Oral Daily Gonfa, Taye T, MD   50 mg at 11/09/24 1008   zolpidem  (AMBIEN ) tablet 5 mg  5 mg Oral QHS PRN Chavez, Abigail, NP   5 mg at 11/08/24 2220     Discharge Medications: Please see discharge summary for a list of discharge medications.  Relevant Imaging Results:  Relevant Lab Results:   Additional Information SSN: [id number]  [name] Aran, LCSW

## 2024-11-09 NOTE — Progress Notes (Signed)
 ABG attempted by 2 RTs with no success. MD made aware.

## 2024-11-09 NOTE — Progress Notes (Signed)
 [facility] Progress Note  Patient: Sierra Atkins     FMW:[id number]  DOA: 11/05/2024   PCP: Default, Provider, MD       Brief hospital course: This is a 58 year old with SLE, brain aneurysm rupture, pseudotumor cerebri status post VP shunt, tobacco abuse, morbid obesity who presents to the hospital for shortness of breath.  She was noted to have wheezing on evaluation.  While in the hospital, she was noted to be somnolent and started on a BiPAP.  Working diagnosis was CHF exacerbation versus COPD exacerbation in the setting of pulmonary hide she was treated with IV Lasix , nebulizer treatments, steroids and has improved.  Subjective:  Would like Oxycodone  to be every 4 hrs. Did not do as well with the CPAP last night as she did with the BiPAP the night before.   Assessment and Plan: Principal Problem:   Dyspnea -Multifactorial and likely secondary to acute diastolic heart failure in the setting of undiagnosed COPD with exacerbation, sleep apnea, obesity hypoventilation and pulmonary hypertension - 2D echo does not reveal any systolic dysfunction.  She has grade 1 diastolic dysfunction with a dilated LV-RV not well-visualized - She was started on Lasix  60 mg IV twice daily - Takes 10 mg of prednisone  at baseline-this was increased to 40 mg daily - Dyspnea has improved - Pulse ox is about 90 to 95% on room air when she ambulates -- switched to oral Lasix  on 12/17 - on 1200 cc fluid restriction - Prednisone  can be reduced back to 10 mg tomorrow - Continue Aldactone  at current dose - Continue nebulizer treatments - We have discussed that she may have sleep apnea and she states that her sleep study scheduled already - trying to obtain ABG to see if she would be ac candidate for Trilogy  Chronic pedal edema - She has venous stasis edema - I have recommended that she elevate her legs which has helped  Chronic pain - The patient has repeatedly asked for an increase in her  oxycodone  dose and states that she needs both hips replaced but has been asked to lose weight before this can be done - At this time she is weak, having trouble ambulating and a skilled nursing facility - Increased her oxycodone  from 5 to 10 mg and recommended that she find an outpatient pain management physician - Interestingly, despite having pain for years, she does not have a pain management physician  SLE - Continue prednisone  and Plaquenil   Insomnia - She has a prescription for Ambien -will continue this  Hypertension - Continue carvedilol , spironolactone  and Lasix   Obesity class III Body mass index is 49.03 kg/m. Gordon Israel    Code Status: Full Code Total time on patient care: 35 minutes DVT prophylaxis:  Place TED hose Start: 11/05/24 2156     Objective:   Vitals:   11/09/24 0500 11/09/24 0522 11/09/24 0810 11/09/24 1127  BP:  (!) 178/80    Pulse:      Resp:      Temp:      TempSrc:      SpO2:   93% 92%  Weight: (!) 142 kg     Height:       Filed Weights   11/05/24 1142 11/08/24 0500 11/09/24 0500  Weight: (!) 142.9 kg (!) 142.3 kg (!) 142 kg   Exam: General exam: Appears comfortable  HEENT: oral mucosa moist Respiratory system: Clear to auscultation.  Breath sounds are difficult to auscultate because of body habitus Cardiovascular system: S1 &  S2 heard  Gastrointestinal system: Abdomen soft, non-tender, nondistended. Normal bowel sounds   Extremities: No cyanosis, clubbing-has moderate pedal edema Psychiatry:  Mood & affect appropriate.      CBC: Recent Labs  Lab 11/05/24 1431 11/06/24 0448 11/07/24 0413 11/08/24 0344  WBC 9.6 10.7* 9.1 9.5  HGB 12.6 13.2 11.8* 12.4  HCT 38.6 41.8 37.3 37.5  MCV 100.3* 102.0* 101.9* 98.7  PLT 234 340 300 348   Basic Metabolic Panel: Recent Labs  Lab 11/05/24 1650 11/06/24 0833 11/07/24 0413 11/08/24 0344 11/08/24 0355  NA 143 140 140  --  139  K 3.6 4.1 3.7  --  4.6  CL 108 103 103  --  99  CO2  24 28 28   --  32  GLUCOSE 97 201* 140*  --  135*  BUN 12 11 20   --  23*  CREATININE 0.70 0.75 0.77  --  0.79  CALCIUM 8.9 8.9 8.9  --  8.9  MG  --  2.1 2.4 2.0  --   PHOS  --   --  3.9  --  3.3     Scheduled Meds:  arformoterol   15 mcg Nebulization BID   budesonide  (PULMICORT ) nebulizer solution  0.25 mg Nebulization BID   DULoxetine   60 mg Oral BID   enoxaparin  (LOVENOX ) injection  70 mg Subcutaneous Q24H   feeding supplement  237 mL Oral BID BM   furosemide   40 mg Oral Daily   gabapentin   300 mg Oral TID   hydroxychloroquine   200 mg Oral Daily   insulin  aspart  0-15 Units Subcutaneous TID WC   [START ON 11/10/2024] predniSONE   10 mg Oral Q breakfast   spironolactone   50 mg Oral Daily    Imaging and lab data personally reviewed   Author: Mercedees Convery  11/09/2024 12:08 PM  To contact [facility]>   Check the care team in [facility] and look for the attending/consulting TR[facility] provider listed  Log into [email or web address] and use [place]'s universal password   Go to> [facility]  and find provider  If you still have difficulty reaching the provider, please page the [facility] (Director on Call) for the Hospitalists listed on amion

## 2024-11-09 NOTE — Progress Notes (Signed)
 Subsequent attempt made to obtain arterial blood gas.  Attempt was unsuccessful.  Patient refused any further attempts.  Spoke with RN regarding changing the order to venous.  RN getting in contact with hospitalist coverage.

## 2024-11-09 NOTE — Plan of Care (Signed)

## 2024-11-10 DIAGNOSIS — R0602 Shortness of breath: Secondary | ICD-10-CM | POA: Diagnosis not present

## 2024-11-10 LAB — GLUCOSE, CAPILLARY
Glucose-Capillary: 143 mg/dL — ABNORMAL HIGH (ref 70–99)
Glucose-Capillary: 208 mg/dL — ABNORMAL HIGH (ref 70–99)
Glucose-Capillary: 228 mg/dL — ABNORMAL HIGH (ref 70–99)
Glucose-Capillary: 175 mg/dL — ABNORMAL HIGH (ref 70–99)

## 2024-11-10 MED ORDER — LOSARTAN POTASSIUM 50 MG PO TABS
50.0000 mg | ORAL_TABLET | Freq: Every day | ORAL | Status: DC
  Administered 2024-11-10 – 2024-11-17 (×8): 50 mg via ORAL
  Filled 2024-11-10 (×8): qty 1

## 2024-11-10 MED ORDER — FUROSEMIDE 40 MG PO TABS
40.0000 mg | ORAL_TABLET | Freq: Two times a day (BID) | ORAL | Status: DC
  Administered 2024-11-10 – 2024-11-13 (×7): 40 mg via ORAL
  Filled 2024-11-10 (×7): qty 1

## 2024-11-10 MED ORDER — HYDROXYZINE HCL 25 MG PO TABS
25.0000 mg | ORAL_TABLET | Freq: Once | ORAL | Status: AC
  Administered 2024-11-10: 25 mg via ORAL
  Filled 2024-11-10: qty 1

## 2024-11-10 MED ORDER — HYDROCORTISONE 1 % EX CREA: TOPICAL_CREAM | Freq: Two times a day (BID) | CUTANEOUS | Status: DC | PRN

## 2024-11-10 NOTE — Inpatient Diabetes Management (Signed)
 Inpatient Diabetes Program Recommendations  AACE/ADA: New Consensus Statement on Inpatient Glycemic Control (2015)  Target Ranges:  Prepandial:   less than 140 mg/dL      Peak postprandial:   less than 180 mg/dL (1-2 hours)      Critically ill patients:  140 - 180 mg/dL   Lab Results  Component Value Date   GLUCAP 208 (H) 11/10/2024   HGBA1C 6.7 (H) 11/06/2024    Review of Glycemic Control  Latest Reference Range & Units 11/09/24 07:22 11/09/24 11:21 11/09/24 16:11 11/09/24 23:01 11/10/24 07:18 11/10/24 11:24  Glucose-Capillary 70 - 99 mg/dL 868 (H) 762 (H) 669 (H) 227 (H) 143 (H) 208 (H)  (H): Data is abnormally high  Diabetes history: None Outpatient Diabetes medications: None Current orders for Inpatient glycemic control: Novolog  0-15 units TID and Prednisone  10 mg QD  Inpatient Diabetes Program Recommendations:     If steroids continue, please consider:  Novolog  2 units TID.  Thank you, Wyvonna Pinal, MSN, CDCES Diabetes Coordinator Inpatient Diabetes Program [phone] (team pager from 8a-5p)

## 2024-11-10 NOTE — Progress Notes (Signed)
 Occupational Therapy Treatment Patient Details Name: Sierra Atkins MRN: [id number] DOB: June 02, 1966 Today's Date: 11/10/2024   History of present illness 58 y.o. female with medical history significant of morbid obesity, lupus, brain aneurysm with hemorrhage in 2019 with a shunt in place, hypertension, arthritis, tobacco use comes to the hospital with complaints of shortness of breath and leg swelling   OT comments  Patient seem for skilled OT session this afternoon. Patient reports not feeling well but open to all therapy presented. Progressed simple standing tolerance ADL's, breathing and pacing integration and UE therex for carryover between sessions. Patient will benefit from continued inpatient follow up therapy, <3 hours/day. Patient requires continued Acute care hospital level OT services to progress safety and functional performance and allow for discharge.        If plan is discharge home, recommend the following:  A lot of help with walking and/or transfers;A lot of help with bathing/dressing/bathroom;Assistance with cooking/housework;Assist for transportation   Equipment Recommendations  Tub/shower seat;BSC/3in1 (bari)       Precautions / Restrictions Precautions Precautions: Fall Restrictions Weight Bearing Restrictions Per Provider Order: No       Mobility Bed Mobility Overal bed mobility:  (was up in recliner and remained)                  Transfers Overall transfer level: Needs assistance Equipment used: Rolling walker (2 wheels) Transfers: Sit to/from Stand, Bed to chair/wheelchair/BSC Sit to Stand: Min assist     Step pivot transfers: Min assist     General transfer comment: see above for stepping up onto scale x 2 for daily weights     Balance Overall balance assessment: History of Falls, Needs assistance   Sitting balance-Leahy Scale: Fair     Standing balance support: Bilateral upper extremity supported, During functional activity,  Reliant on assistive device for balance Standing balance-Leahy Scale: Poor                             ADL either performed or assessed with clinical judgement   ADL Overall ADL's : Needs assistance/impaired Eating/Feeding: Independent   Grooming: Set up;Sitting   Upper Body Bathing: Set up;Sitting   Lower Body Bathing: Moderate assistance;Maximal assistance;Sitting/lateral leans   Upper Body Dressing : Set up   Lower Body Dressing: Maximal assistance   Toilet Transfer: Minimal assistance;Moderate assistance   Toileting- Clothing Manipulation and Hygiene: Maximal assistance;Sitting/lateral lean       Functional mobility during ADLs: Minimal assistance;Moderate assistance (stepped up on scale for daily weights x 2 with no LOB and unilateral hand hold to rails) General ADL Comments: educated on breathing techniques and pacing    Extremity/Trunk Assessment Upper Extremity Assessment Upper Extremity Assessment: Overall WFL for tasks assessed   Lower Extremity Assessment Lower Extremity Assessment: Defer to PT evaluation                 Communication Communication Communication: Impaired Factors Affecting Communication: Reduced clarity of speech   Cognition Arousal: Alert Behavior During Therapy: WFL for tasks assessed/performed Cognition: No apparent impairments                               Following commands: Intact        Cueing   Cueing Techniques: Verbal cues  Exercises Exercises: Other exercises (pursed lip breathing and B foam grasp ball Bly)  General Comments DOE with some coughing but then educated on pacing and breathing integration    Pertinent Vitals/ Pain       Pain Assessment Pain Assessment: 0-10 Pain Score: 2  Pain Location: B hips Pain Descriptors / Indicators: Aching, Discomfort Pain Intervention(s): Limited activity within patient's tolerance, Monitored during session   Frequency  Min 2X/week         Progress Toward Goals  OT Goals(current goals can now be found in the care plan section)  Progress towards OT goals: Progressing toward goals  Acute Rehab OT Goals Patient Stated Goal: to get stronger OT Goal Formulation: With patient Time For Goal Achievement: 11/21/24 Potential to Achieve Goals: Good ADL Goals Pt Will Perform Lower Body Bathing: with min assist;sit to/from stand;with adaptive equipment Pt Will Perform Lower Body Dressing: with min assist;with adaptive equipment;sit to/from stand Pt Will Transfer to Toilet: with min assist;ambulating Pt Will Perform Toileting - Clothing Manipulation and hygiene: with min assist;sit to/from stand;sitting/lateral leans;with adaptive equipment  Plan      \   AM-PAC OT 6 Clicks Daily Activity     Outcome Measure   Help from another person eating meals?: None Help from another person taking care of personal grooming?: A Little Help from another person toileting, which includes using toliet, bedpan, or urinal?: Total Help from another person bathing (including washing, rinsing, drying)?: A Lot Help from another person to put on and taking off regular upper body clothing?: A Little Help from another person to put on and taking off regular lower body clothing?: A Lot 6 Click Score: 15    End of Session Equipment Utilized During Treatment: Gait belt;Rolling walker (2 wheels);Oxygen  OT Visit Diagnosis: Unsteadiness on feet (R26.81);Other abnormalities of gait and mobility (R26.89);Muscle weakness (generalized) (M62.81);History of falling (Z91.81);Pain Pain - part of body: Hip   Activity Tolerance Patient limited by pain;Patient limited by fatigue   Patient Left in chair;with call bell/phone within reach;with chair alarm set   Nurse Communication Mobility status        Time: 8583-8561 OT Time Calculation (min): 22 min  Charges: OT General Charges $OT Visit: 1 Visit OT Treatments $Therapeutic Activity: 8-22  mins  Coston Mandato OT/L Acute Rehabilitation Department  [phone]  11/10/2024, 3:42 PM

## 2024-11-10 NOTE — Progress Notes (Addendum)
 " [facility] Progress Note  Patient: Sierra Atkins     FMW:[id number]  DOA: 11/05/2024   PCP: Default, Provider, MD       Brief hospital course: This is a 58 year old with SLE, brain aneurysm rupture, pseudotumor cerebri status post VP shunt, tobacco abuse, morbid obesity who presents to the hospital for shortness of breath.  She was noted to have wheezing on evaluation.  While in the hospital, she was noted to be somnolent and started on a BiPAP.  Working diagnosis was CHF exacerbation versus COPD exacerbation in the setting of pulmonary hide she was treated with IV Lasix , nebulizer treatments, steroids and has improved.  Subjective:  No complaints today  Assessment and Plan: Principal Problem:   Dyspnea -Multifactorial and likely secondary to acute diastolic heart failure in the setting of undiagnosed COPD with exacerbation, sleep apnea, obesity hypoventilation and pulmonary hypertension - 2D echo does not reveal any systolic dysfunction.  She has grade 1 diastolic dysfunction with a dilated LV-RV not well-visualized - She was started on Lasix  60 mg IV twice daily - Takes 10 mg of prednisone  at baseline-this was increased to 40 mg daily - Dyspnea has improved - Pulse ox is about 90 to 95% on room air when she ambulates -- switched to oral Lasix  on 12/17 - on 1200 cc fluid restriction - Prednisone  can be reduced back to 10 mg tomorrow - Continue Aldactone  at current dose - Continue nebulizer treatments - We have discussed that she may have sleep apnea and she states that her sleep study scheduled already - trying to obtain ABG to see if she would be ac candidate for Trilogy - Noted to have crackles in left lung base today-have increased Lasix  to 40 twice daily from daily  Chronic pedal edema - She has venous stasis edema - I have recommended that she elevate her legs which has helped  Hypertension - Add hydralazine  - Continue furosemide  and Aldactone .  Chronic  pain - The patient has repeatedly asked for an increase in her oxycodone  dose and states that she needs both hips replaced but has been asked to lose weight before this can be done - At this time she is weak, having trouble ambulating and a skilled nursing facility - Increased her oxycodone  from 5 to 10 mg and recommended that she find an outpatient pain management physician - Interestingly, despite having pain for years, she does not have a pain management physician and receives only short courses of oxycodone  from her PCP  SLE - Continue prednisone  and Plaquenil   Insomnia - She has a prescription for Ambien -will continue this    Obesity class III Body mass index is 49.03 kg/m. Gordon Israel    Code Status: Full Code Total time on patient care: 35 minutes DVT prophylaxis:  Place TED hose Start: 11/05/24 2156     Objective:   Vitals:   11/10/24 0632 11/10/24 0744 11/10/24 0745 11/10/24 1302  BP: (!) 173/98   (!) 155/111  Pulse:    81  Resp:    20  Temp:    98.9 F (37.2 C)  TempSrc:    Oral  SpO2:  98% 98% 97%  Weight:      Height:       Filed Weights   11/05/24 1142 11/08/24 0500 11/09/24 0500  Weight: (!) 142.9 kg (!) 142.3 kg (!) 142 kg   Exam: General exam: Appears comfortable  HEENT: oral mucosa moist Respiratory system: Clear to auscultation.  Crackles in left lower  lung field Cardiovascular system: S1 & S2 heard  Gastrointestinal system: Abdomen soft, non-tender, nondistended. Normal bowel sounds   Extremities: No cyanosis, clubbing-has moderate pedal edema Psychiatry:  Mood & affect appropriate.      CBC: Recent Labs  Lab 11/05/24 1431 11/06/24 0448 11/07/24 0413 11/08/24 0344  WBC 9.6 10.7* 9.1 9.5  HGB 12.6 13.2 11.8* 12.4  HCT 38.6 41.8 37.3 37.5  MCV 100.3* 102.0* 101.9* 98.7  PLT 234 340 300 348   Basic Metabolic Panel: Recent Labs  Lab 11/05/24 1650 11/06/24 0833 11/07/24 0413 11/08/24 0344 11/08/24 0355  NA 143 140 140  --  139   K 3.6 4.1 3.7  --  4.6  CL 108 103 103  --  99  CO2 24 28 28   --  32  GLUCOSE 97 201* 140*  --  135*  BUN 12 11 20   --  23*  CREATININE 0.70 0.75 0.77  --  0.79  CALCIUM 8.9 8.9 8.9  --  8.9  MG  --  2.1 2.4 2.0  --   PHOS  --   --  3.9  --  3.3     Scheduled Meds:  arformoterol   15 mcg Nebulization BID   budesonide  (PULMICORT ) nebulizer solution  0.25 mg Nebulization BID   DULoxetine   60 mg Oral BID   enoxaparin  (LOVENOX ) injection  70 mg Subcutaneous Q24H   feeding supplement  237 mL Oral BID BM   furosemide   40 mg Oral BID   gabapentin   300 mg Oral TID   hydroxychloroquine   200 mg Oral Daily   insulin  aspart  0-15 Units Subcutaneous TID WC   predniSONE   10 mg Oral Q breakfast   spironolactone   50 mg Oral Daily    Imaging and lab data personally reviewed   Author: Hayzel Ruberg  11/10/2024 1:08 PM  To contact [facility]>   Check the care team in [facility] and look for the attending/consulting [facility] provider listed  Log into [email or web address] and use [place]'s universal password   Go to> [facility]  and find provider  If you still have difficulty reaching the provider, please page the [facility] (Director on Call) for the Hospitalists listed on amion     "

## 2024-11-10 NOTE — Plan of Care (Signed)

## 2024-11-10 NOTE — Progress Notes (Signed)
" °   11/10/24 1948  BiPAP/CPAP/SIPAP  BiPAP/CPAP/SIPAP Pt Type Adult  BiPAP/CPAP/SIPAP Resmed  Mask Type Full face mask  Dentures removed? Not applicable  Mask Size Large  IPAP 12 cmH20  EPAP 5 cmH2O  FiO2 (%) 21 %  Patient Home Machine No  Patient Home Mask No  Patient Home Tubing No  Auto Titrate No  CPAP/SIPAP surface wiped down Yes  Device Plugged into RED Power Outlet Yes  BiPAP/CPAP /SiPAP Vitals  Pulse Rate 86  Resp (!) 23  SpO2 96 %  Bilateral Breath Sounds Clear;Diminished  MEWS Score/Color  MEWS Score 1  MEWS Score Color Green    "

## 2024-11-10 NOTE — Progress Notes (Signed)
" °   11/10/24 0035  BiPAP/CPAP/SIPAP  $ Face Mask Large  Yes  BiPAP/CPAP/SIPAP Pt Type Adult  BiPAP/CPAP/SIPAP Resmed  Mask Type Full face mask  Dentures removed? Not applicable  Mask Size Large (new mask;  old mask still had blue elbow on it.)  Respiratory Rate 20 breaths/min  IPAP 12 cmH20  EPAP 5 cmH2O  FiO2 (%) 21 %  Patient Home Machine No  Patient Home Mask No  Patient Home Tubing No  Auto Titrate No  CPAP/SIPAP surface wiped down Yes  Device Plugged into RED Power Outlet Yes    "

## 2024-11-10 NOTE — TOC Progression Note (Signed)
 Transition of Care [facility]) - Progression Note   Patient Details  Name: Sierra Atkins MRN: [id number] Date of Birth: Nov 02, 1966  Transition of Care [facility]) CM/SW Contact  [name] Aran, LCSW Phone Number: 11/10/2024, 12:29 PM  Clinical Narrative: Patient does not have any bed offers for SNF at this time as most facilities are out of network with patient's insurance. Bed search expanded. Care management awaiting bed offers.  Expected Discharge Plan: Skilled Nursing Facility Barriers to Discharge: Continued Medical Work up, SNF Pending bed offer, English As A Second Language Teacher  Expected Discharge Plan and Services In-house Referral: Clinical Social Work Post Acute Care Choice: Skilled Nursing Facility Living arrangements for the past 2 months: Apartment           DME Arranged: N/A DME Agency: NA  Social Drivers of Health (SDOH) Interventions SDOH Screenings   Food Insecurity: No Food Insecurity (11/06/2024)  Housing: Low Risk (11/06/2024)  Transportation Needs: No Transportation Needs (11/06/2024)  Utilities: Not At Risk (11/06/2024)  Financial Resource Strain: Low Risk (12/27/2023)   Received from [facility]  Physical Activity: Unknown (12/27/2023)   Received from [facility]  Social Connections: Socially Integrated (12/27/2023)   Received from [facility]  Stress: No Stress Concern Present (08/05/2024)   Received from [facility]  Tobacco Use: Medium Risk (11/05/2024)   Readmission Risk Interventions    12/06/2023   11:51 AM  Readmission Risk Prevention Plan  Transportation Screening Complete  PCP or Specialist Appt within 5-7 Days Complete  Home Care Screening Complete  Medication Review (RN CM) Complete

## 2024-11-11 DIAGNOSIS — R0602 Shortness of breath: Secondary | ICD-10-CM | POA: Diagnosis not present

## 2024-11-11 LAB — BASIC METABOLIC PANEL WITH GFR
Anion gap: 10 (ref 5–15)
BUN: 24 mg/dL — ABNORMAL HIGH (ref 6–20)
CO2: 32 mmol/L (ref 22–32)
Calcium: 9.3 mg/dL (ref 8.9–10.3)
Chloride: 93 mmol/L — ABNORMAL LOW (ref 98–111)
Creatinine, Ser: 0.79 mg/dL (ref 0.44–1.00)
GFR, Estimated: >60 mL/min
Glucose, Bld: 195 mg/dL — ABNORMAL HIGH (ref 70–99)
Potassium: 4.1 mmol/L (ref 3.5–5.1)
Sodium: 136 mmol/L (ref 135–145)

## 2024-11-11 LAB — GLUCOSE, CAPILLARY
Glucose-Capillary: 172 mg/dL — ABNORMAL HIGH (ref 70–99)
Glucose-Capillary: 165 mg/dL — ABNORMAL HIGH (ref 70–99)
Glucose-Capillary: 243 mg/dL — ABNORMAL HIGH (ref 70–99)
Glucose-Capillary: 203 mg/dL — ABNORMAL HIGH (ref 70–99)

## 2024-11-11 MED ORDER — PNEUMOCOCCAL 20-VAL CONJ VACC 0.5 ML IM SUSY
0.5000 mL | PREFILLED_SYRINGE | INTRAMUSCULAR | Status: DC
  Filled 2024-11-11: qty 0.5

## 2024-11-11 MED ORDER — HYDRALAZINE HCL 50 MG PO TABS
50.0000 mg | ORAL_TABLET | Freq: Three times a day (TID) | ORAL | Status: DC
  Administered 2024-11-11 – 2024-11-14 (×9): 50 mg via ORAL
  Filled 2024-11-11 (×9): qty 1

## 2024-11-11 MED ORDER — HYDROXYZINE HCL 25 MG PO TABS
25.0000 mg | ORAL_TABLET | Freq: Every evening | ORAL | Status: AC | PRN
  Administered 2024-11-11 – 2024-11-12 (×2): 25 mg via ORAL
  Filled 2024-11-11 (×2): qty 1

## 2024-11-11 NOTE — Progress Notes (Signed)
 " [facility] Progress Note  Patient: Sierra Atkins     FMW:[id number]  DOA: 11/05/2024   PCP: Default, Provider, MD       Brief hospital course: This is a 58 year old with SLE, brain aneurysm rupture, pseudotumor cerebri status post VP shunt, tobacco abuse, morbid obesity who presents to the hospital for shortness of breath.  She was noted to have wheezing on evaluation.  While in the hospital, she was noted to be somnolent and started on a BiPAP.  Working diagnosis was CHF exacerbation versus COPD exacerbation in the setting of pulmonary hide she was treated with IV Lasix , nebulizer treatments, steroids and has improved.  Subjective:  Complains of feeling like she needed to go back on oxygen last night. Has no specific complaints of dyspnea. Has had a mild cough.   Assessment and Plan: Principal Problem:   Dyspnea -Multifactorial and likely secondary to acute diastolic heart failure in the setting of undiagnosed COPD with exacerbation, sleep apnea, obesity hypoventilation and pulmonary hypertension - 2D echo does not reveal any systolic dysfunction.  She has grade 1 diastolic dysfunction with a dilated LV-RV not well-visualized - She was started on Lasix  60 mg IV twice daily- now on 40 mg BID - Takes 10 mg of prednisone  at baseline-this was increased to 40 mg daily - Dyspnea has improved - Pulse ox is about 90 to 95% on room air when she ambulates - on 1200 cc fluid restriction - Prednisone  can be reduced back to 10 mg   - Continue Aldactone  at current dose - Continue nebulizer treatments - We have discussed that she may have sleep apnea and she states that her sleep study scheduled already - trying to obtain ABG to see if she would be ac candidate for Trilogy    Chronic pedal edema - She has venous stasis edema - I have recommended that she elevate her legs which has helped  Hypertension - Added hydralazine -   - Continue furosemide  and Aldactone .  Chronic pain -  The patient has repeatedly asked for an increase in her oxycodone  dose and states that she needs both hips replaced but has been asked to lose weight before this can be done - At this time she is weak, having trouble ambulating and a skilled nursing facility - Increased her oxycodone  from 5 to 10 mg and recommended that she find an outpatient pain management physician - Interestingly, despite having pain for years, she does not have a pain management physician and receives only short courses of oxycodone  from her PCP  SLE - Continue prednisone  and Plaquenil   Insomnia - She has a prescription for Ambien -will continue this    Obesity class III Body mass index is 49.03 kg/m. Gordon Israel    Code Status: Full Code Total time on patient care: 35 minutes DVT prophylaxis:  Place TED hose Start: 11/05/24 2156     Objective:   Vitals:   11/11/24 0423 11/11/24 0835 11/11/24 1032 11/11/24 1034  BP: (!) 148/117     Pulse: 80     Resp: 19     Temp: 98.5 F (36.9 C)     TempSrc: Oral     SpO2: 100% 95% 95% 95%  Weight:      Height:       Filed Weights   11/05/24 1142 11/08/24 0500 11/09/24 0500  Weight: (!) 142.9 kg (!) 142.3 kg (!) 142 kg   Exam: General exam: Appears comfortable  HEENT: oral mucosa moist Respiratory system: Clear to  auscultation.  Crackles in left lower lung field improved Cardiovascular system: S1 & S2 heard  Gastrointestinal system: Abdomen soft, non-tender, nondistended. Normal bowel sounds   Extremities: No cyanosis, clubbing-has moderate pedal edema Psychiatry:  Mood & affect appropriate.      CBC: Recent Labs  Lab 11/05/24 1431 11/06/24 0448 11/07/24 0413 11/08/24 0344  WBC 9.6 10.7* 9.1 9.5  HGB 12.6 13.2 11.8* 12.4  HCT 38.6 41.8 37.3 37.5  MCV 100.3* 102.0* 101.9* 98.7  PLT 234 340 300 348   Basic Metabolic Panel: Recent Labs  Lab 11/05/24 1650 11/06/24 0833 11/07/24 0413 11/08/24 0344 11/08/24 0355 11/11/24 0632  NA 143 140 140   --  139 136  K 3.6 4.1 3.7  --  4.6 4.1  CL 108 103 103  --  99 93*  CO2 24 28 28   --  32 32  GLUCOSE 97 201* 140*  --  135* 195*  BUN 12 11 20   --  23* 24*  CREATININE 0.70 0.75 0.77  --  0.79 0.79  CALCIUM 8.9 8.9 8.9  --  8.9 9.3  MG  --  2.1 2.4 2.0  --   --   PHOS  --   --  3.9  --  3.3  --      Scheduled Meds:  arformoterol   15 mcg Nebulization BID   budesonide  (PULMICORT ) nebulizer solution  0.25 mg Nebulization BID   DULoxetine   60 mg Oral BID   enoxaparin  (LOVENOX ) injection  70 mg Subcutaneous Q24H   feeding supplement  237 mL Oral BID BM   furosemide   40 mg Oral BID   gabapentin   300 mg Oral TID   hydroxychloroquine   200 mg Oral Daily   insulin  aspart  0-15 Units Subcutaneous TID WC   losartan   50 mg Oral Daily   predniSONE   10 mg Oral Q breakfast   spironolactone   50 mg Oral Daily    Imaging and lab data personally reviewed   Author: True Atlas  11/11/2024 11:50 AM  To contact [facility]>   Check the care team in [facility] and look for the attending/consulting TRH provider listed  Log into [email or web address] and use [place]'s universal password   Go to> [facility]  and find provider  If you still have difficulty reaching the provider, please page the [facility] (Director on Call) for the Hospitalists listed on amion     "

## 2024-11-11 NOTE — Progress Notes (Signed)
" °   11/11/24 2345  BiPAP/CPAP/SIPAP  BiPAP/CPAP/SIPAP Pt Type Adult  BiPAP/CPAP/SIPAP Resmed  Mask Type Full face mask  Dentures removed? Not applicable  Mask Size Large  IPAP 12 cmH20  EPAP 5 cmH2O  Flow Rate 2 lpm  Patient Home Machine No  Patient Home Mask No  Patient Home Tubing No  Auto Titrate No  Device Plugged into RED Power Outlet Yes    "

## 2024-11-12 DIAGNOSIS — R0602 Shortness of breath: Secondary | ICD-10-CM | POA: Diagnosis not present

## 2024-11-12 LAB — BASIC METABOLIC PANEL WITH GFR
Anion gap: 8 (ref 5–15)
BUN: 33 mg/dL — ABNORMAL HIGH (ref 6–20)
CO2: 33 mmol/L — ABNORMAL HIGH (ref 22–32)
Calcium: 9.4 mg/dL (ref 8.9–10.3)
Chloride: 93 mmol/L — ABNORMAL LOW (ref 98–111)
Creatinine, Ser: 1.05 mg/dL — ABNORMAL HIGH (ref 0.44–1.00)
GFR, Estimated: >60 mL/min
Glucose, Bld: 248 mg/dL — ABNORMAL HIGH (ref 70–99)
Potassium: 5.2 mmol/L — ABNORMAL HIGH (ref 3.5–5.1)
Sodium: 134 mmol/L — ABNORMAL LOW (ref 135–145)

## 2024-11-12 LAB — MAGNESIUM: Magnesium: 2.6 mg/dL — ABNORMAL HIGH (ref 1.7–2.4)

## 2024-11-12 LAB — GLUCOSE, CAPILLARY
Glucose-Capillary: 188 mg/dL — ABNORMAL HIGH (ref 70–99)
Glucose-Capillary: 136 mg/dL — ABNORMAL HIGH (ref 70–99)
Glucose-Capillary: 222 mg/dL — ABNORMAL HIGH (ref 70–99)
Glucose-Capillary: 201 mg/dL — ABNORMAL HIGH (ref 70–99)

## 2024-11-12 LAB — POTASSIUM: Potassium: 5.2 mmol/L — ABNORMAL HIGH (ref 3.5–5.1)

## 2024-11-12 MED ORDER — EMPAGLIFLOZIN 10 MG PO TABS
10.0000 mg | ORAL_TABLET | Freq: Every day | ORAL | Status: DC
  Administered 2024-11-12 – 2024-11-17 (×6): 10 mg via ORAL
  Filled 2024-11-12 (×6): qty 1

## 2024-11-12 NOTE — Progress Notes (Addendum)
 " [facility] Progress Note  Patient: Sierra Atkins     FMW:[id number]  DOA: 11/05/2024   PCP: Default, Provider, MD       Brief hospital course: This is a 58 year old with SLE, brain aneurysm rupture, pseudotumor cerebri status post VP shunt, tobacco abuse, morbid obesity who presents to the hospital for shortness of breath.  She was noted to have wheezing on evaluation.  While in the hospital, she was noted to be somnolent and started on a BiPAP.  Working diagnosis was CHF exacerbation versus COPD exacerbation in the setting of pulmonary hide she was treated with IV Lasix , nebulizer treatments, steroids and has improved.  Subjective:  She has no complaints. Advised to stop using the purewick but is hesitant to do this because of issues with urinary incontinence  Assessment and Plan: Principal Problem:   Dyspnea -Multifactorial and likely secondary to acute diastolic heart failure in the setting of undiagnosed COPD with exacerbation, sleep apnea, obesity hypoventilation and pulmonary hypertension - 2D echo does not reveal any systolic dysfunction.  She has grade 1 diastolic dysfunction with a dilated LV-RV not well-visualized - She was started on Lasix  60 mg IV twice daily-  - Takes 10 mg of prednisone  at baseline-this was increased to 40 mg daily - Dyspnea has improved - Pulse ox is about 90 to 95% on room air when she ambulates - on 1200 cc fluid restriction - Prednisone  has steadily reduced back to 10 mg   - We have discussed that she may have sleep apnea and she states that her sleep study scheduled already - attempted but unable to obtain ABG to see if she would be ac candidate for Trilogy - recommend a pulmonary referral as outpt for PFTs in addition to the sleep study  - added emphagliflozin - currently on Lasix  40 mg BID and Aldactone  daily which I recommend she be discharged with - will need referral to CHF clinic as well  Chronic pedal edema - She has venous  stasis edema - I have recommended that she elevate her legs which has helped  Hypertension - Added hydralazine -  improvement noted - Continue Losartan , furosemide  and Aldactone .  Chronic pain - The patient has repeatedly asked for an increase in her oxycodone  dose and states that she needs both hips replaced but has been asked to lose weight before this can be done - At this time she is weak, having trouble ambulating and a skilled nursing facility - Increased her oxycodone  from 5 to 10 mg and recommended that she find an outpatient pain management physician - Interestingly, despite having pain for years, she does not have a pain management physician and receives only short courses of oxycodone  from her PCP  SLE - Continue prednisone  and Plaquenil   DM2 - A1c 6.7 - glucose> 200 consistently in the hospital - started empagliflozin    Insomnia - She has a prescription for Ambien -will continue this   Obesity class III Body mass index is 49.03 kg/m. -Will benefit from GLP 1 agonist  Generalized weakness - PT recommended SNF     Code Status: Full Code Total time on patient care: 35 minutes DVT prophylaxis:  Place TED hose Start: 11/05/24 2156     Objective:   Vitals:   11/11/24 1251 11/11/24 2112 11/12/24 0520 11/12/24 0539  BP: 123/78 (!) 149/79 (!) 149/79 137/71  Pulse: 73 78  81  Resp: 16 20  20   Temp: 97.8 F (36.6 C) 98.2 F (36.8 C)  98.6 F (37  C)  TempSrc: Oral Oral  Axillary  SpO2: 97% 99%  96%  Weight:      Height:       Filed Weights   11/05/24 1142 11/08/24 0500 11/09/24 0500  Weight: (!) 142.9 kg (!) 142.3 kg (!) 142 kg   Exam: General exam: Appears comfortable  HEENT: oral mucosa moist Respiratory system: Clear to auscultation.    Cardiovascular system: S1 & S2 heard  Gastrointestinal system: Abdomen soft, non-tender, nondistended. Normal bowel sounds   Extremities: No cyanosis, clubbing - no pedal edema Psychiatry:  Mood & affect appropriate.       CBC: Recent Labs  Lab 11/05/24 1431 11/06/24 0448 11/07/24 0413 11/08/24 0344  WBC 9.6 10.7* 9.1 9.5  HGB 12.6 13.2 11.8* 12.4  HCT 38.6 41.8 37.3 37.5  MCV 100.3* 102.0* 101.9* 98.7  PLT 234 340 300 348   Basic Metabolic Panel: Recent Labs  Lab 11/05/24 1650 11/06/24 0833 11/07/24 0413 11/08/24 0344 11/08/24 0355 11/11/24 0632  NA 143 140 140  --  139 136  K 3.6 4.1 3.7  --  4.6 4.1  CL 108 103 103  --  99 93*  CO2 24 28 28   --  32 32  GLUCOSE 97 201* 140*  --  135* 195*  BUN 12 11 20   --  23* 24*  CREATININE 0.70 0.75 0.77  --  0.79 0.79  CALCIUM 8.9 8.9 8.9  --  8.9 9.3  MG  --  2.1 2.4 2.0  --   --   PHOS  --   --  3.9  --  3.3  --      Scheduled Meds:  arformoterol   15 mcg Nebulization BID   budesonide  (PULMICORT ) nebulizer solution  0.25 mg Nebulization BID   DULoxetine   60 mg Oral BID   empagliflozin   10 mg Oral Daily   enoxaparin  (LOVENOX ) injection  70 mg Subcutaneous Q24H   feeding supplement  237 mL Oral BID BM   furosemide   40 mg Oral BID   gabapentin   300 mg Oral TID   hydrALAZINE   50 mg Oral Q8H   hydroxychloroquine   200 mg Oral Daily   insulin  aspart  0-15 Units Subcutaneous TID WC   losartan   50 mg Oral Daily   pneumococcal 20-valent conjugate vaccine  0.5 mL Intramuscular Tomorrow-1000   predniSONE   10 mg Oral Q breakfast   spironolactone   50 mg Oral Daily    Imaging and lab data personally reviewed   Author: True Atlas  11/12/2024 7:21 AM  To contact [facility]>   Check the care team in [facility] and look for the attending/consulting TRH provider listed  Log into [email or web address] and use [place]'s universal password   Go to> [facility]  and find provider  If you still have difficulty reaching the provider, please page the [facility] (Director on Call) for the Hospitalists listed on amion     "

## 2024-11-13 DIAGNOSIS — R0602 Shortness of breath: Secondary | ICD-10-CM | POA: Diagnosis not present

## 2024-11-13 LAB — CBC
HCT: 41.6 % (ref 36.0–46.0)
Hemoglobin: 13.6 g/dL (ref 12.0–15.0)
MCH: 32.2 pg (ref 26.0–34.0)
MCHC: 32.7 g/dL (ref 30.0–36.0)
MCV: 98.6 fL (ref 80.0–100.0)
Platelets: 402 K/uL — ABNORMAL HIGH (ref 150–400)
RBC: 4.22 MIL/uL (ref 3.87–5.11)
RDW: 13.9 % (ref 11.5–15.5)
WBC: 10.5 K/uL (ref 4.0–10.5)
nRBC: 0 % (ref 0.0–0.2)

## 2024-11-13 LAB — BASIC METABOLIC PANEL WITH GFR
Anion gap: 11 (ref 5–15)
BUN: 32 mg/dL — ABNORMAL HIGH (ref 6–20)
CO2: 30 mmol/L (ref 22–32)
Calcium: 9.6 mg/dL (ref 8.9–10.3)
Chloride: 95 mmol/L — ABNORMAL LOW (ref 98–111)
Creatinine, Ser: 0.83 mg/dL (ref 0.44–1.00)
GFR, Estimated: >60 mL/min
Glucose, Bld: 179 mg/dL — ABNORMAL HIGH (ref 70–99)
Potassium: 4.2 mmol/L (ref 3.5–5.1)
Sodium: 136 mmol/L (ref 135–145)

## 2024-11-13 LAB — GLUCOSE, CAPILLARY
Glucose-Capillary: 274 mg/dL — ABNORMAL HIGH (ref 70–99)
Glucose-Capillary: 162 mg/dL — ABNORMAL HIGH (ref 70–99)
Glucose-Capillary: 174 mg/dL — ABNORMAL HIGH (ref 70–99)
Glucose-Capillary: 157 mg/dL — ABNORMAL HIGH (ref 70–99)

## 2024-11-13 MED ORDER — FUROSEMIDE 40 MG PO TABS
40.0000 mg | ORAL_TABLET | Freq: Every day | ORAL | Status: DC
  Administered 2024-11-14: 40 mg via ORAL
  Filled 2024-11-13: qty 1

## 2024-11-13 MED ORDER — HYDROXYZINE HCL 25 MG PO TABS
25.0000 mg | ORAL_TABLET | Freq: Every evening | ORAL | Status: AC | PRN
  Administered 2024-11-13 – 2024-11-14 (×2): 25 mg via ORAL
  Filled 2024-11-13 (×3): qty 1

## 2024-11-13 MED ORDER — SPIRONOLACTONE 25 MG PO TABS
25.0000 mg | ORAL_TABLET | Freq: Every day | ORAL | Status: DC
  Administered 2024-11-13 – 2024-11-14 (×2): 25 mg via ORAL
  Filled 2024-11-13 (×2): qty 1

## 2024-11-13 NOTE — Progress Notes (Signed)
 " [facility] Progress Note  Patient: Sierra Atkins     FMW:[id number]  DOA: 11/05/2024   PCP: Default, Provider, MD       Brief hospital course: This is a 58 year old with SLE, brain aneurysm rupture, pseudotumor cerebri status post VP shunt, tobacco abuse, morbid obesity who presents to the hospital for shortness of breath.  She was noted to have wheezing on evaluation.  While in the hospital, she was noted to be somnolent and started on a BiPAP.  Working diagnosis was CHF exacerbation versus COPD exacerbation in the setting of pulmonary hide she was treated with IV Lasix , nebulizer treatments, steroids and has improved.  Subjective:  No complaints.   Assessment and Plan: Principal Problem:   Dyspnea -Multifactorial and likely secondary to acute diastolic heart failure in the setting of undiagnosed COPD with exacerbation, sleep apnea, obesity hypoventilation and pulmonary hypertension - 2D echo does not reveal any systolic dysfunction.  She has grade 1 diastolic dysfunction with a dilated LV-RV not well-visualized - She was started on Lasix  60 mg IV twice daily-  - Takes 10 mg of prednisone  at baseline-this was increased to 40 mg daily - Dyspnea has improved - Pulse ox is about 90 to 95% on room air when she ambulates - on 1200 cc fluid restriction - Prednisone  has steadily reduced back to 10 mg   - We have discussed that she may have sleep apnea and she states that her sleep study scheduled already - attempted but unable to obtain ABG to see if she would be ac candidate for Trilogy - recommend a pulmonary referral as outpt for PFTs in addition to the sleep study  - added emphagliflozin - currently on Lasix  40 mg BID and Aldactone  daily which I recommend she be discharged with - will need referral to CHF clinic as well  Chronic pedal edema - She has venous stasis edema - I have recommended that she elevate her legs which has helped  Hypertension - Added hydralazine -   improvement noted - Continue Losartan , furosemide  and Aldactone .  Chronic pain - The patient has repeatedly asked for an increase in her oxycodone  dose and states that she needs both hips replaced but has been asked to lose weight before this can be done - At this time she is weak, having trouble ambulating and a skilled nursing facility - Increased her oxycodone  from 5 to 10 mg and recommended that she find an outpatient pain management physician - Interestingly, despite having pain for years, she does not have a pain management physician and receives only short courses of oxycodone  from her PCP  SLE - Continue prednisone  and Plaquenil   DM2 - A1c 6.7 - glucose> 200 consistently in the hospital - started empagliflozin    Mild hyperkalemia - K 5.2- improved to 4.2 w/o intervention  Insomnia - She has a prescription for Ambien -will continue this   Obesity class III Body mass index is 55.74 kg/m. -Will benefit from GLP 1 agonist  Generalized weakness - PT recommended SNF     Code Status: Full Code Total time on patient care: 35 minutes DVT prophylaxis:  Place TED hose Start: 11/05/24 2156     Objective:   Vitals:   11/13/24 0421 11/13/24 0515 11/13/24 0617 11/13/24 0927  BP:  134/74    Pulse:  68    Resp:  19    Temp:  97.6 F (36.4 C)    TempSrc:  Oral    SpO2:  91% 93% 94%  Weight: ROLLEN)  161.4 kg     Height:       Filed Weights   11/08/24 0500 11/09/24 0500 11/13/24 0421  Weight: (!) 142.3 kg (!) 142 kg (!) 161.4 kg   Exam: General exam: Appears comfortable  HEENT: oral mucosa moist Respiratory system: Clear to auscultation.    Cardiovascular system: S1 & S2 heard  Gastrointestinal system: Abdomen soft, non-tender, nondistended. Normal bowel sounds   Extremities: No cyanosis, clubbing - no pedal edema Psychiatry:  Mood & affect appropriate.      CBC: Recent Labs  Lab 11/07/24 0413 11/08/24 0344 11/13/24 0328  WBC 9.1 9.5 10.5  HGB 11.8* 12.4 13.6   HCT 37.3 37.5 41.6  MCV 101.9* 98.7 98.6  PLT 300 348 402*   Basic Metabolic Panel: Recent Labs  Lab 11/07/24 0413 11/08/24 0344 11/08/24 0355 11/11/24 0632 11/12/24 1648 11/12/24 1833 11/13/24 0952  NA 140  --  139 136 134*  --  136  K 3.7  --  4.6 4.1 5.2* 5.2* 4.2  CL 103  --  99 93* 93*  --  95*  CO2 28  --  32 32 33*  --  30  GLUCOSE 140*  --  135* 195* 248*  --  179*  BUN 20  --  23* 24* 33*  --  32*  CREATININE 0.77  --  0.79 0.79 1.05*  --  0.83  CALCIUM 8.9  --  8.9 9.3 9.4  --  9.6  MG 2.4 2.0  --   --  2.6*  --   --   PHOS 3.9  --  3.3  --   --   --   --      Scheduled Meds:  arformoterol   15 mcg Nebulization BID   budesonide  (PULMICORT ) nebulizer solution  0.25 mg Nebulization BID   DULoxetine   60 mg Oral BID   empagliflozin   10 mg Oral Daily   enoxaparin  (LOVENOX ) injection  70 mg Subcutaneous Q24H   feeding supplement  237 mL Oral BID BM   furosemide   40 mg Oral BID   gabapentin   300 mg Oral TID   hydrALAZINE   50 mg Oral Q8H   hydroxychloroquine   200 mg Oral Daily   insulin  aspart  0-15 Units Subcutaneous TID WC   losartan   50 mg Oral Daily   pneumococcal 20-valent conjugate vaccine  0.5 mL Intramuscular Tomorrow-1000   predniSONE   10 mg Oral Q breakfast    Imaging and lab data personally reviewed   Author: True Atlas  11/13/2024 12:13 PM  To contact [facility]>   Check the care team in [facility] and look for the attending/consulting TRH provider listed  Log into [email or web address] and use [place]'s universal password   Go to> [facility]  and find provider  If you still have difficulty reaching the provider, please page the [facility] (Director on Call) for the Hospitalists listed on amion     "

## 2024-11-13 NOTE — Progress Notes (Signed)
 Physical Therapy Treatment Patient Details Name: Sierra Atkins MRN: [id number] DOB: 03/27/1966 Today's Date: 11/13/2024   History of Present Illness 58 y.o. female with medical history significant of morbid obesity, lupus, brain aneurysm with hemorrhage in 2019 with a shunt in place, hypertension, arthritis, tobacco use comes to the hospital with complaints of shortness of breath and leg swelling    PT Comments  The patient agreeable to  mobilize to [facility] then to recliner. Patient  reports some dizziness  when mobilizing. Patient will benefit from continued inpatient follow up therapy, <3 hours/day     If plan is discharge home, recommend the following: A lot of help with walking and/or transfers;A lot of help with bathing/dressing/bathroom;Assist for transportation;Help with stairs or ramp for entrance   Can travel by private vehicle        Equipment Recommendations  None recommended by PT    Recommendations for Other Services       Precautions / Restrictions Precautions Precautions: Fall Restrictions Weight Bearing Restrictions Per Provider Order: No     Mobility  Bed Mobility   Bed Mobility: Supine to Sit     Supine to sit: Min assist     General bed mobility comments: use of rail, Pull with 1 UE to sitting    Transfers Overall transfer level: Needs assistance Equipment used: Rolling walker (2 wheels) Transfers: Sit to/from Stand, Bed to chair/wheelchair/BSC Sit to Stand: Min assist, +2 safety/equipment   Step pivot transfers: Min assist, +2 safety/equipment       General transfer comment: pt reports feeling dizzy, stated new medication, able to  stand and pivot to [facility] then to recliner    Ambulation/Gait                   Stairs             Wheelchair Mobility     Tilt Bed    Modified Rankin (Stroke Patients Only)       Balance Overall balance assessment: History of Falls, Needs assistance   Sitting balance-Leahy Scale: Fair      Standing balance support: Bilateral upper extremity supported, During functional activity, Reliant on assistive device for balance Standing balance-Leahy Scale: Poor                              Communication    Cognition Arousal: Alert Behavior During Therapy: WFL for tasks assessed/performed   PT - Cognitive impairments: No apparent impairments                                Cueing    Exercises      General Comments        Pertinent Vitals/Pain Pain Assessment Pain Assessment: No/denies pain    Home Living                          Prior Function            PT Goals (current goals can now be found in the care plan section) Progress towards PT goals: Progressing toward goals    Frequency    Min 2X/week      PT Plan      Co-evaluation              AM-PAC PT 6 Clicks Mobility   Outcome Measure  Help needed  turning from your back to your side while in a flat bed without using bedrails?: A Little Help needed moving from lying on your back to sitting on the side of a flat bed without using bedrails?: A Little Help needed moving to and from a bed to a chair (including a wheelchair)?: A Lot Help needed standing up from a chair using your arms (e.g., wheelchair or bedside chair)?: A Lot Help needed to walk in hospital room?: A Lot Help needed climbing 3-5 steps with a railing? : Total 6 Click Score: 13    End of Session Equipment Utilized During Treatment: Gait belt Activity Tolerance: Patient tolerated treatment well Patient left: in chair;with call bell/phone within reach;with chair alarm set Nurse Communication: Mobility status PT Visit Diagnosis: Unsteadiness on feet (R26.81);History of falling (Z91.81)     Time: 8879-8850 PT Time Calculation (min) (ACUTE ONLY): 29 min  Charges:    $Therapeutic Activity: 8-22 mins $Self Care/Home Management: 8-22 PT General Charges $$ ACUTE PT VISIT: 1 Visit                     [name] PT Acute Rehabilitation Services Office [phone]    [name] 11/13/2024, 1:33 PM

## 2024-11-13 NOTE — Progress Notes (Signed)
" °   11/13/24 2346  BiPAP/CPAP/SIPAP  BiPAP/CPAP/SIPAP Pt Type Adult  BiPAP/CPAP/SIPAP Resmed  Mask Type Full face mask  Dentures removed? Not applicable  Mask Size Large  Set Rate 0 breaths/min  Respiratory Rate 18 breaths/min  IPAP 12 cmH20  EPAP 5 cmH2O  Flow Rate 2 lpm  Patient Home Machine No  Patient Home Mask No  Patient Home Tubing No  Auto Titrate No  CPAP/SIPAP surface wiped down Yes  Device Plugged into RED Power Outlet Yes  Oxygen Percent 28 %    "

## 2024-11-13 NOTE — TOC Progression Note (Addendum)
 Transition of Care [facility]) - Progression Note   Patient Details  Name: Sierra Atkins MRN: [id number] Date of Birth: [birth year]  Transition of Care [facility]) CM/SW Contact  [name] Aran, LCSW Phone Number: 11/13/2024, 12:59 PM  Clinical Narrative: Patient does not have any bed offers, but a few facilities indicated the patient is being considered. CSW reached out to Brittany with [facility] and [place] with [facility] to see if patient could be reviewed for admission. Care management awaiting follow up from SNFs.  Addendum: CSW updated by Isaiah with [facility] that while patient is in the facility her SSI will be reduced to $30 and then will return to the regular amount once she discharges.  CSW updated patient and patient reported she wants to discuss this with her daughter before proceeding. Patient aware that a formal bed offer has not been made and the referral is under consideration by the facilities.  Expected Discharge Plan: Skilled Nursing Facility Barriers to Discharge: Continued Medical Work up, SNF Pending bed offer, English As A Second Language Teacher  Expected Discharge Plan and Services In-house Referral: Clinical Social Work Post Acute Care Choice: Skilled Nursing Facility Living arrangements for the past 2 months: Apartment           DME Arranged: N/A DME Agency: NA  Social Drivers of Health (SDOH) Interventions SDOH Screenings   Food Insecurity: No Food Insecurity (11/06/2024)  Housing: Low Risk (11/06/2024)  Transportation Needs: No Transportation Needs (11/06/2024)  Utilities: Not At Risk (11/06/2024)  Financial Resource Strain: Low Risk (12/27/2023)   Received from [facility]  Physical Activity: Unknown (12/27/2023)   Received from [facility]  Social Connections: Socially Integrated (12/27/2023)   Received from [facility]  Stress: No Stress Concern Present (08/05/2024)   Received from [facility]  Tobacco Use: Medium Risk (11/05/2024)    Readmission Risk Interventions    12/06/2023   11:51 AM  Readmission Risk Prevention Plan  Transportation Screening Complete  PCP or Specialist Appt within 5-7 Days Complete  Home Care Screening Complete  Medication Review (RN CM) Complete

## 2024-11-14 DIAGNOSIS — R0602 Shortness of breath: Secondary | ICD-10-CM | POA: Diagnosis not present

## 2024-11-14 LAB — GLUCOSE, CAPILLARY
Glucose-Capillary: 176 mg/dL — ABNORMAL HIGH (ref 70–99)
Glucose-Capillary: 208 mg/dL — ABNORMAL HIGH (ref 70–99)
Glucose-Capillary: 162 mg/dL — ABNORMAL HIGH (ref 70–99)
Glucose-Capillary: 165 mg/dL — ABNORMAL HIGH (ref 70–99)

## 2024-11-14 MED ORDER — ENOXAPARIN SODIUM 80 MG/0.8ML IJ SOSY
80.0000 mg | PREFILLED_SYRINGE | INTRAMUSCULAR | Status: DC
  Administered 2024-11-14 – 2024-11-16 (×3): 80 mg via SUBCUTANEOUS
  Filled 2024-11-14 (×3): qty 0.8

## 2024-11-14 MED ORDER — SODIUM CHLORIDE 0.9 % IV BOLUS
500.0000 mL | Freq: Once | INTRAVENOUS | Status: AC
  Administered 2024-11-14: 500 mL via INTRAVENOUS

## 2024-11-14 MED ORDER — METFORMIN HCL 500 MG PO TABS
500.0000 mg | ORAL_TABLET | Freq: Two times a day (BID) | ORAL | Status: DC
  Administered 2024-11-14 – 2024-11-17 (×6): 500 mg via ORAL
  Filled 2024-11-14 (×6): qty 1

## 2024-11-14 NOTE — Progress Notes (Signed)
" °   11/14/24 2350  BiPAP/CPAP/SIPAP  BiPAP/CPAP/SIPAP Pt Type Adult  BiPAP/CPAP/SIPAP Resmed  Mask Type Full face mask  Mask Size Large  Set Rate 0 breaths/min  Respiratory Rate 18 breaths/min  IPAP 12 cmH20  EPAP 5 cmH2O  Flow Rate 2 lpm  Patient Home Machine No  Patient Home Mask No  Patient Home Tubing No  Auto Titrate No  Press High Alarm 25 cmH2O  Device Plugged into RED Power Outlet Yes    "

## 2024-11-14 NOTE — Progress Notes (Signed)
 " [facility] Progress Note  Patient: Sierra Atkins     FMW:[id number]  DOA: 11/05/2024   PCP: Default, Provider, MD       Brief hospital course: This is a 58 year old with SLE, brain aneurysm rupture, pseudotumor cerebri status post VP shunt, tobacco abuse, morbid obesity who presents to the hospital for shortness of breath.  She was noted to have wheezing on evaluation.  While in the hospital, she was noted to be somnolent and started on a BiPAP.  Working diagnosis was CHF exacerbation versus COPD exacerbation in the setting of pulmonary hide she was treated with IV Lasix , nebulizer treatments, steroids and has improved.  Subjective:  Dizziness with standing  Assessment and Plan: Principal Problem:   Dyspnea -Multifactorial and likely secondary to acute diastolic heart failure in the setting of undiagnosed COPD with exacerbation, sleep apnea, obesity hypoventilation and pulmonary hypertension - 2D echo does not reveal any systolic dysfunction.  She has grade 1 diastolic dysfunction with a dilated LV-RV not well-visualized - She was started on Lasix  60 mg IV twice daily-  - Takes 10 mg of prednisone  at baseline-this was increased to 40 mg daily - Dyspnea has improved - Pulse ox is about 90 to 95% on room air when she ambulates - on 1200 cc fluid restriction - Prednisone  has steadily reduced back to 10 mg   - We have discussed that she may have sleep apnea and she states that her sleep study scheduled already - attempted but unable to obtain ABG to see if she would be ac candidate for Trilogy - recommend a pulmonary referral as outpt for PFTs in addition to the sleep study  - added emphagliflozin - will need referral to CHF clinic as well  Orthostatic hypotension - hold Lasix  and Aldactone  today- can continue empgliflozin  - give 500 cc NS bolus and recheck orthostatics afterwards  Chronic pedal edema - She has venous stasis edema - I have recommended that she elevate  her legs which has helped  Hypertension - Added hydralazine - which was helping however, as she is orthostatic, will need to hold this along with her diuretics today - Continue Losartan    Chronic pain - The patient has repeatedly asked for an increase in her oxycodone  dose and states that she needs both hips replaced but has been asked to lose weight before this can be done - At this time she is weak, having trouble ambulating and a skilled nursing facility is being recommended - Increased her oxycodone  from 5 to 10 mg and recommended that she find an outpatient pain management physician - Interestingly, despite having pain for years, she does not have a pain management physician and receives only short courses of oxycodone  from her PCP  SLE - Continue prednisone  and Plaquenil   DM2 - A1c 6.7 - glucose> 200 consistently in the hospital - started empagliflozin  and Metformin   Mild hyperkalemia - K 5.2- improved to 4.2 w/o intervention  Insomnia - She has a prescription for Ambien -will continue this   Obesity class III Body mass index is 56.59 kg/m. -Will benefit from GLP 1 agonist  Generalized weakness - PT recommended SNF     Code Status: Full Code Total time on patient care: 35 minutes DVT prophylaxis:  Place TED hose Start: 11/05/24 2156     Objective:   Vitals:   11/14/24 0447 11/14/24 0452 11/14/24 0803 11/14/24 1000  BP:  136/76  135/74  Pulse:  88  72  Resp:  19  18  Temp:  98.5 F (36.9 C)    TempSrc:  Oral  Oral  SpO2:  98% 95% 98%  Weight: (!) 163.9 kg     Height:       Filed Weights   11/09/24 0500 11/13/24 0421 11/14/24 0447  Weight: (!) 142 kg (!) 161.4 kg (!) 163.9 kg   Exam: General exam: Appears comfortable  HEENT: oral mucosa moist Respiratory system: Clear to auscultation.    Cardiovascular system: S1 & S2 heard  Gastrointestinal system: Abdomen soft, non-tender, nondistended. Normal bowel sounds   Extremities: No cyanosis, clubbing - no  pedal edema Psychiatry:  Mood & affect appropriate.      CBC: Recent Labs  Lab 11/08/24 0344 11/13/24 0328  WBC 9.5 10.5  HGB 12.4 13.6  HCT 37.5 41.6  MCV 98.7 98.6  PLT 348 402*   Basic Metabolic Panel: Recent Labs  Lab 11/08/24 0344 11/08/24 0355 11/11/24 0632 11/12/24 1648 11/12/24 1833 11/13/24 0952  NA  --  139 136 134*  --  136  K  --  4.6 4.1 5.2* 5.2* 4.2  CL  --  99 93* 93*  --  95*  CO2  --  32 32 33*  --  30  GLUCOSE  --  135* 195* 248*  --  179*  BUN  --  23* 24* 33*  --  32*  CREATININE  --  0.79 0.79 1.05*  --  0.83  CALCIUM  --  8.9 9.3 9.4  --  9.6  MG 2.0  --   --  2.6*  --   --   PHOS  --  3.3  --   --   --   --      Scheduled Meds:  arformoterol   15 mcg Nebulization BID   budesonide  (PULMICORT ) nebulizer solution  0.25 mg Nebulization BID   DULoxetine   60 mg Oral BID   empagliflozin   10 mg Oral Daily   enoxaparin  (LOVENOX ) injection  70 mg Subcutaneous Q24H   feeding supplement  237 mL Oral BID BM   gabapentin   300 mg Oral TID   hydrALAZINE   50 mg Oral Q8H   hydroxychloroquine   200 mg Oral Daily   insulin  aspart  0-15 Units Subcutaneous TID WC   losartan   50 mg Oral Daily   pneumococcal 20-valent conjugate vaccine  0.5 mL Intramuscular Tomorrow-1000   predniSONE   10 mg Oral Q breakfast    Imaging and lab data personally reviewed   Author: [employer]  11/14/2024 11:54 AM  To contact [facility]>   Check the care team in [facility] and look for the attending/consulting TRH provider listed  Log into [email or web address] and use [place]'s universal password   Go to> [facility]  and find provider  If you still have difficulty reaching the provider, please page the [facility] (Director on Call) for the Hospitalists listed on amion     "

## 2024-11-14 NOTE — TOC Progression Note (Signed)
 Transition of Care [facility]) - Progression Note    Patient Details  Name: Sierra Atkins MRN: [id number] Date of Birth: 1966/06/08  Transition of Care [facility]) CM/SW Contact  Tawni CHRISTELLA Eva, LCSW Phone Number: 11/14/2024, 1:31 PM  Clinical Narrative:     Still no bed offers, ICM to follow.    Expected Discharge Plan: Skilled Nursing Facility Barriers to Discharge: Continued Medical Work up, SNF Pending bed offer, English As A Second Language Teacher               Expected Discharge Plan and Services In-house Referral: Clinical Social Work   Post Acute Care Choice: Skilled Nursing Facility Living arrangements for the past 2 months: Apartment                 DME Arranged: N/A DME Agency: NA                   Social Drivers of Health (SDOH) Interventions SDOH Screenings   Food Insecurity: No Food Insecurity (11/06/2024)  Housing: Low Risk (11/06/2024)  Transportation Needs: No Transportation Needs (11/06/2024)  Utilities: Not At Risk (11/06/2024)  Financial Resource Strain: Low Risk (12/27/2023)   Received from [facility]  Physical Activity: Unknown (12/27/2023)   [facility]  Social Connections: Socially Integrated (12/27/2023)   [facility]  Stress: No Stress Concern Present (08/05/2024)   Received from [facility]  Tobacco Use: Medium Risk (11/05/2024)    Readmission Risk Interventions    12/06/2023   11:51 AM  Readmission Risk Prevention Plan  Transportation Screening Complete  PCP or Specialist Appt within 5-7 Days Complete  Home Care Screening Complete  Medication Review (RN CM) Complete

## 2024-11-14 NOTE — Progress Notes (Signed)
 OT Cancellation Note  Patient Details Name: Sierra Atkins MRN: [id number] DOB: [birth year]   Cancelled Treatment:    Reason Eval/Treat Not Completed: Fatigue/lethargy limiting ability to participate  OT attempted visit. Patient reports I just came back from the test and feel tired. OT will continue to follow as schedule allows.   Shasha Buchbinder OT/L Acute Rehabilitation Department  [phone]   11/14/2024, 12:53 PM

## 2024-11-14 NOTE — Plan of Care (Incomplete)
" °  Problem: Education: Goal: Ability to describe self-care measures that may prevent or decrease complications (Diabetes Survival Skills Education) will improve Outcome: Progressing Goal: Individualized Educational Video(s) Outcome: Progressing   Problem: Coping: Goal: Ability to adjust to condition or change in health will improve Outcome: Progressing   Problem: Fluid Volume: Goal: Ability to maintain a balanced intake and output will improve Outcome: Progressing   Problem: Health Behavior/Discharge Planning: Goal: Ability to identify and utilize available resources and services will improve Outcome: Progressing Goal: Ability to manage health-related needs will improve Outcome: Progressing   Problem: Metabolic: Goal: Ability to maintain appropriate glucose levels will improve Outcome: Progressing   Problem: Nutritional: Goal: Progress toward achieving an optimal weight will improve Outcome: Progressing   Problem: Tissue Perfusion: Goal: Adequacy of tissue perfusion will improve Outcome: Progressing   Problem: Clinical Measurements: Goal: Respiratory complications will improve Outcome: Progressing   Problem: Activity: Goal: Risk for activity intolerance will decrease Outcome: Progressing   Problem: Coping: Goal: Level of anxiety will decrease Outcome: Progressing   Problem: Nutritional: Goal: Maintenance of adequate nutrition will improve Outcome: Adequate for Discharge   Problem: Skin Integrity: Goal: Risk for impaired skin integrity will decrease Outcome: Adequate for Discharge   Problem: Clinical Measurements: Goal: Cardiovascular complication will be avoided Outcome: Adequate for Discharge   Problem: Nutrition: Goal: Adequate nutrition will be maintained Outcome: Adequate for Discharge   Problem: Elimination: Goal: Will not experience complications related to bowel motility Outcome: Adequate for Discharge Goal: Will not experience complications  related to urinary retention Outcome: Adequate for Discharge   "

## 2024-11-15 DIAGNOSIS — J441 Chronic obstructive pulmonary disease with (acute) exacerbation: Secondary | ICD-10-CM | POA: Diagnosis not present

## 2024-11-15 DIAGNOSIS — I5033 Acute on chronic diastolic (congestive) heart failure: Secondary | ICD-10-CM | POA: Diagnosis not present

## 2024-11-15 DIAGNOSIS — G4733 Obstructive sleep apnea (adult) (pediatric): Secondary | ICD-10-CM | POA: Diagnosis not present

## 2024-11-15 DIAGNOSIS — G894 Chronic pain syndrome: Secondary | ICD-10-CM | POA: Diagnosis not present

## 2024-11-15 DIAGNOSIS — R0602 Shortness of breath: Secondary | ICD-10-CM | POA: Diagnosis not present

## 2024-11-15 LAB — GLUCOSE, CAPILLARY
Glucose-Capillary: 156 mg/dL — ABNORMAL HIGH (ref 70–99)
Glucose-Capillary: 162 mg/dL — ABNORMAL HIGH (ref 70–99)
Glucose-Capillary: 114 mg/dL — ABNORMAL HIGH (ref 70–99)
Glucose-Capillary: 167 mg/dL — ABNORMAL HIGH (ref 70–99)

## 2024-11-15 MED ORDER — MOMETASONE FURO-FORMOTEROL FUM 200-5 MCG/ACT IN AERO: 2.0000 | INHALATION_SPRAY | Freq: Two times a day (BID) | RESPIRATORY_TRACT | Status: AC

## 2024-11-15 MED ORDER — METHOCARBAMOL 500 MG PO TABS: 500.0000 mg | ORAL_TABLET | Freq: Three times a day (TID) | ORAL | 0 refills | Status: AC | PRN

## 2024-11-15 MED ORDER — INSULIN ASPART 100 UNIT/ML IJ SOLN: 0.0000 [IU] | Freq: Three times a day (TID) | INTRAMUSCULAR | Status: DC

## 2024-11-15 MED ORDER — ZOLPIDEM TARTRATE 5 MG PO TABS: 5.0000 mg | ORAL_TABLET | Freq: Every evening | ORAL | 0 refills | Status: AC | PRN

## 2024-11-15 MED ORDER — METFORMIN HCL 500 MG PO TABS: 500.0000 mg | ORAL_TABLET | Freq: Two times a day (BID) | ORAL | Status: AC

## 2024-11-15 MED ORDER — OXYCODONE-ACETAMINOPHEN 5-325 MG PO TABS: 2.0000 | ORAL_TABLET | Freq: Four times a day (QID) | ORAL | 0 refills | Status: AC | PRN

## 2024-11-15 MED ORDER — LOSARTAN POTASSIUM 50 MG PO TABS: 50.0000 mg | ORAL_TABLET | Freq: Every day | ORAL | Status: AC

## 2024-11-15 MED ORDER — HYDROXYCHLOROQUINE SULFATE 200 MG PO TABS: 200.0000 mg | ORAL_TABLET | Freq: Every day | ORAL | Status: AC

## 2024-11-15 MED ORDER — EMPAGLIFLOZIN 10 MG PO TABS: 10.0000 mg | ORAL_TABLET | Freq: Every day | ORAL | Status: AC

## 2024-11-15 MED ORDER — DULOXETINE HCL 60 MG PO CPEP: 60.0000 mg | ORAL_CAPSULE | Freq: Two times a day (BID) | ORAL | Status: AC

## 2024-11-15 NOTE — Progress Notes (Signed)
" °   11/15/24 2352  BiPAP/CPAP/SIPAP  BiPAP/CPAP/SIPAP Pt Type Adult  BiPAP/CPAP/SIPAP Resmed  Mask Type Full face mask  Dentures removed? Not applicable  Mask Size Large  Set Rate 0 breaths/min  Respiratory Rate 18 breaths/min  IPAP 12 cmH20  EPAP 5 cmH2O  FiO2 (%) 28 %  Flow Rate 2 lpm  Patient Home Machine No  Patient Home Mask No  Patient Home Tubing No  Auto Titrate No  Press High Alarm 25 cmH2O  CPAP/SIPAP surface wiped down Yes  Device Plugged into RED Power Outlet Yes    "

## 2024-11-15 NOTE — Progress Notes (Signed)
 PT Cancellation Note  Patient Details Name: Sierra Atkins MRN: [id number] DOB: 27-Feb-1966   Cancelled Treatment:     Pt declined any OOB activity stating she was up earlier and now hurting.   Pt asking for IV Dilaudid  for B hip pain.  Plus my LUPUS, I be hurting.   Pt has been evaluated and LPT has rec [facility] at SNF.  Rehab Team to continue to follow and attempt to see another day/time as schedule permits.    Katheryn Leap  PTA Acute  Rehabilitation Services Office M-F          [phone]

## 2024-11-15 NOTE — Progress Notes (Signed)
 " PROGRESS NOTE  Sierra Atkins FMW:[id number] DOB: [birth year]   PCP: Default, Provider, MD  Patient is from: Home.  Uses rolling walker at baseline.  DOA: 11/05/2024 LOS: 9  Chief complaints Chief Complaint  Patient presents with   Shortness of Breath   Dizziness   Fall     Brief Narrative / Interim history: 58 year old F with PMH of OSA/OHS not on CPAP, brain aneurysm hemorrhage in 2019 s/p shunt, HTN, lupus, osteoarthritis, DDD, tobacco use disorder and morbid obesity presented to ED with shortness of breath, edema and leg pain, and admitted with working diagnosis of dyspnea, possible COPD, CHF and pulmonary hypertension.   In ED, SBP ranges from 150s to 180s.  VBG without significant finding.  CBC and labs without significant finding.  proBNP and serial troponin negative.  CT head without acute finding.  CT angio chest negative for PE but showed mild cardiomegaly and signs of pulmonary hypertension.  Patient was started on BiPAP, IV Lasix , steroid and nebulizers and admitted.  Breathing and edema improved with diuretics, breathing treatments and steroid.  TTE showed    Subjective: Seen and examined earlier this morning.  No major events overnight or this morning.  Reports feeling better this morning.   Assessment and plan: Dyspnea and dyspnea with exertion: Multifactorial including CHF, untreated OSA/OHS, PAH and COPD. Reports history of OSA but never used CPAP.  CT angio chest negative for PE or infiltrate but suggested PAH.  TTE with normal LVEF G1DD and not able to visualize RV function.  It is difficult to determine fluid status given her body habitus but edema has clearly improved.  Breathing has improved as well. - BiPAP at night and as needed. -Continue nebulizers. -Minimum oxygen to keep saturation above 88% due to risk of CO2 retention. -Minimize sedating medications.   Acute on chronic diastolic CHF -TTE as above.  She is on Lasix  and Aldactone  at home.   Serial  troponin and BNP negative.  CT angio chest with mild cardiomegaly and possible PAH.  Diuresed with IV Lasix  60 mg twice daily.  Appears euvolemic but difficult to assess due to body habitus.  Breathing has improved. -Continue holding diuretics. -Strict intake and output, daily weight, renal functions and electrolytes.  Possible COPD exacerbation -never diagnosed, but she is still smoking and has smoked for a while.  Had wheezing on presentation.  -Completed steroid course and wean to home prednisone  10 mg daily -Continue nebs in house -Encourage smoking cessation.   Essential hypertension/orthostatic hypotension:Takes Lasix  and Aldactone . Chronic pedal edema - Try compression socks.   OSA/OHS: Probably the main problem.  Patient reports history of OSA but never wore CPAP -Needs outpatient follow-up in sleep clinic -BiPAP at night and as needed.    History of lupus: Stable. - Continue home prednisone  and Plaquenil .   Tobacco use-smoking about 5 cigarettes/day.   -Counseled for cessation  Depression/insomnia: Stable.  On Cymbalta . - Continue Cymbalta . - Continue Ambien  5 mg nightly.  DM-2: A1c 6.7%. - Continue metformin  and Jardiance .  Mild hyperkalemia: Resolved.  Chronic back pain/osteoarthritis/DDD:  -Continue Percocet Percocet, Robaxin  and Cymbalta . -PT/OT eval  Morbid obesity Body mass index is 56.6 kg/m.           DVT prophylaxis:  Place TED hose Start: 11/05/24 2156  Code Status: Full code Family Communication: Due to respiratory distress Level of care: Progressive Status is: Inpatient The patient will remain inpatient because: Respiratory distress requiring BiPAP, acute CHF   Final disposition: SNF.  55 minutes with more than 50% spent in reviewing records, counseling patient/family and coordinating care.  Consultants:  None  Procedures: None  Microbiology summarized: None  Objective: Vitals:   11/14/24 2015 11/15/24 0500 11/15/24 0529  11/15/24 1302  BP: (!) 139/58  (!) 161/91 (!) 145/86  Pulse: 66  78 72  Resp: 18  18 19   Temp: 98.7 F (37.1 C)  98.9 F (37.2 C) 98 F (36.7 C)  TempSrc: Oral  Oral Oral  SpO2: 100%  95% 97%  Weight:  (!) 163.9 kg    Height:        Examination:  GENERAL: No apparent distress.  Nontoxic. HEENT: MMM.  Vision and hearing grossly intact.  NECK: Supple.  No apparent JVD.  RESP:  No IWOB.  Fair aeration bilaterally. CVS:  RRR. Heart sounds normal.  ABD/GI/GU: BS+. Abd soft, NTND.  MSK/EXT:   No apparent deformity. Moves extremities.  Trace BLE edema. SKIN: no apparent skin lesion or wound NEURO: Awake and alert. Oriented appropriately.  No apparent focal neuro deficit. PSYCH: Calm. Normal affect.   Sch Meds:  Scheduled Meds:  arformoterol   15 mcg Nebulization BID   budesonide  (PULMICORT ) nebulizer solution  0.25 mg Nebulization BID   DULoxetine   60 mg Oral BID   empagliflozin   10 mg Oral Daily   enoxaparin  (LOVENOX ) injection  80 mg Subcutaneous Q24H   gabapentin   300 mg Oral TID   hydroxychloroquine   200 mg Oral Daily   insulin  aspart  0-15 Units Subcutaneous TID WC   losartan   50 mg Oral Daily   metFORMIN   500 mg Oral BID WC   pneumococcal 20-valent conjugate vaccine  0.5 mL Intramuscular Tomorrow-1000   predniSONE   10 mg Oral Q breakfast   Continuous Infusions: PRN Meds:.acetaminophen  **OR** acetaminophen , hydrocortisone  cream, hydrOXYzine , ipratropium-albuterol , methocarbamol , ondansetron  **OR** ondansetron  (ZOFRAN ) IV, oxyCODONE -acetaminophen , zolpidem   Antimicrobials: Anti-infectives (From admission, onward)    Start     Dose/Rate Route Frequency Ordered Stop   11/15/24 0000  hydroxychloroquine  (PLAQUENIL ) 200 MG tablet        200 mg Oral Daily 11/15/24 1203     11/08/24 1645  hydroxychloroquine  (PLAQUENIL ) tablet 200 mg        200 mg Oral Daily 11/08/24 1549     11/05/24 2200  hydroxychloroquine  (PLAQUENIL ) tablet 200 mg  Status:  Discontinued        200 mg  Oral Daily 11/05/24 2155 11/06/24 1640   11/05/24 2030  ceFAZolin  (ANCEF ) IVPB 2g/100 mL premix        2 g 200 mL/hr over 30 Minutes Intravenous  Once 11/05/24 2025 11/05/24 2147        I have personally reviewed the following labs and images: CBC: Recent Labs  Lab 11/13/24 0328  WBC 10.5  HGB 13.6  HCT 41.6  MCV 98.6  PLT 402*   BMP &GFR Recent Labs  Lab 11/11/24 0632 11/12/24 1648 11/12/24 1833 11/13/24 0952  NA 136 134*  --  136  K 4.1 5.2* 5.2* 4.2  CL 93* 93*  --  95*  CO2 32 33*  --  30  GLUCOSE 195* 248*  --  179*  BUN 24* 33*  --  32*  CREATININE 0.79 1.05*  --  0.83  CALCIUM 9.3 9.4  --  9.6  MG  --  2.6*  --   --    Estimated Creatinine Clearance: 122.5 mL/min (by C-G formula based on SCr of 0.83 mg/dL). Liver & Pancreas: No results for  input(s): AST, ALT, ALKPHOS, BILITOT, PROT, ALBUMIN in the last 168 hours.  No results for input(s): LIPASE, AMYLASE in the last 168 hours. No results for input(s): AMMONIA in the last 168 hours.  Diabetic: No results for input(s): HGBA1C in the last 72 hours.  Recent Labs  Lab 11/14/24 1136 11/14/24 1604 11/14/24 2237 11/15/24 0757 11/15/24 1133  GLUCAP 208* 162* 165* 156* 162*   Cardiac Enzymes: No results for input(s): CKTOTAL, CKMB, CKMBINDEX, TROPONINI in the last 168 hours. Recent Labs    11/05/24 1650  PROBNP 82.2   Coagulation Profile: No results for input(s): INR, PROTIME in the last 168 hours. Thyroid Function Tests: No results for input(s): TSH, T4TOTAL, FREET4, T3FREE, THYROIDAB in the last 72 hours. Lipid Profile: No results for input(s): CHOL, HDL, LDLCALC, TRIG, CHOLHDL, LDLDIRECT in the last 72 hours. Anemia Panel: No results for input(s): VITAMINB12, FOLATE, FERRITIN, TIBC, IRON, RETICCTPCT in the last 72 hours. Urine analysis:    Component Value Date/Time   COLORURINE YELLOW 12/05/2023 1724   APPEARANCEUR CLOUDY (A)  12/05/2023 1724   LABSPEC 1.025 12/05/2023 1724   PHURINE 6.5 12/05/2023 1724   GLUCOSEU NEGATIVE 12/05/2023 1724   HGBUR NEGATIVE 12/05/2023 1724   BILIRUBINUR NEGATIVE 12/05/2023 1724   KETONESUR NEGATIVE 12/05/2023 1724   PROTEINUR NEGATIVE 12/05/2023 1724   UROBILINOGEN 1.0 10/24/2011 0107   NITRITE NEGATIVE 12/05/2023 1724   LEUKOCYTESUR TRACE (A) 12/05/2023 1724   Sepsis Labs: Invalid input(s): PROCALCITONIN, LACTICIDVEN  Microbiology: No results found for this or any previous visit (from the past 240 hours).  Radiology Studies: No results found.     Sierra Atkins T. Sierra Atkins [facility]  If 7PM-7AM, please contact night-coverage [email or web address] 11/15/2024, 1:22 PM   "

## 2024-11-15 NOTE — Plan of Care (Signed)
  Problem: Clinical Measurements: Goal: Respiratory complications will improve Outcome: Progressing   Problem: Clinical Measurements: Goal: Cardiovascular complication will be avoided Outcome: Progressing   Problem: Nutrition: Goal: Adequate nutrition will be maintained Outcome: Progressing   Problem: Safety: Goal: Ability to remain free from injury will improve Outcome: Progressing   

## 2024-11-15 NOTE — TOC Progression Note (Addendum)
 Transition of Care [facility]) - Progression Note    Patient Details  Name: Sierra Atkins MRN: [id number] Date of Birth: Feb 29, 1966  Transition of Care [facility]) CM/SW Contact  Doneta Glenys DASEN, RN Phone Number: 11/15/2024, 8:37 AM  Clinical Narrative:    9:46 AM [facility] has accepted. CM will present choice.   8:56 AM [facility] has accepted and is requesting an LOG for admission.  8:37 am CM called [facility]-left message, [facility]-left message, and [facility] to check what information may be needed to accept patient.  Expected Discharge Plan: Skilled Nursing Facility Barriers to Discharge: Continued Medical Work up, SNF Pending bed offer, English As A Second Language Teacher    Expected Discharge Plan and Services In-house Referral: Clinical Social Work   Post Acute Care Choice: Skilled Nursing Facility Living arrangements for the past 2 months: Apartment                 DME Arranged: N/A DME Agency: NA                   Social Drivers of Health (SDOH) Interventions SDOH Screenings   Food Insecurity: No Food Insecurity (11/06/2024)  Housing: Low Risk (11/06/2024)  Transportation Needs: No Transportation Needs (11/06/2024)  Utilities: Not At Risk (11/06/2024)  Financial Resource Strain: Low Risk (12/27/2023)   Received from [facility]  Physical Activity: Unknown (12/27/2023)   Received from [facility]  Social Connections: Socially Integrated (12/27/2023)   Received from [facility]  Stress: No Stress Concern Present (08/05/2024)   Received from [facility]  Tobacco Use: Medium Risk (11/05/2024)    Readmission Risk Interventions    12/06/2023   11:51 AM  Readmission Risk Prevention Plan  Transportation Screening Complete  PCP or Specialist Appt within 5-7 Days Complete  Home Care Screening Complete  Medication Review (RN CM) Complete

## 2024-11-16 DIAGNOSIS — G894 Chronic pain syndrome: Secondary | ICD-10-CM

## 2024-11-16 DIAGNOSIS — R0602 Shortness of breath: Secondary | ICD-10-CM | POA: Diagnosis not present

## 2024-11-16 DIAGNOSIS — J441 Chronic obstructive pulmonary disease with (acute) exacerbation: Secondary | ICD-10-CM | POA: Diagnosis not present

## 2024-11-16 DIAGNOSIS — G4733 Obstructive sleep apnea (adult) (pediatric): Secondary | ICD-10-CM | POA: Diagnosis not present

## 2024-11-16 LAB — COMPREHENSIVE METABOLIC PANEL WITH GFR
ALT: 16 U/L (ref 0–44)
AST: 18 U/L (ref 15–41)
Albumin: 3.9 g/dL (ref 3.5–5.0)
Alkaline Phosphatase: 94 U/L (ref 38–126)
Anion gap: 12 (ref 5–15)
BUN: 21 mg/dL — ABNORMAL HIGH (ref 6–20)
CO2: 25 mmol/L (ref 22–32)
Calcium: 9.5 mg/dL (ref 8.9–10.3)
Chloride: 99 mmol/L (ref 98–111)
Creatinine, Ser: 0.69 mg/dL (ref 0.44–1.00)
GFR, Estimated: >60 mL/min
Glucose, Bld: 137 mg/dL — ABNORMAL HIGH (ref 70–99)
Potassium: 4.1 mmol/L (ref 3.5–5.1)
Sodium: 136 mmol/L (ref 135–145)
Total Bilirubin: 0.4 mg/dL (ref 0.0–1.2)
Total Protein: 7.8 g/dL (ref 6.5–8.1)

## 2024-11-16 LAB — CBC
HCT: 43.1 % (ref 36.0–46.0)
Hemoglobin: 13.8 g/dL (ref 12.0–15.0)
MCH: 31.7 pg (ref 26.0–34.0)
MCHC: 32 g/dL (ref 30.0–36.0)
MCV: 98.9 fL (ref 80.0–100.0)
Platelets: 364 K/uL (ref 150–400)
RBC: 4.36 MIL/uL (ref 3.87–5.11)
RDW: 13.6 % (ref 11.5–15.5)
WBC: 8.3 K/uL (ref 4.0–10.5)
nRBC: 0 % (ref 0.0–0.2)

## 2024-11-16 LAB — GLUCOSE, CAPILLARY
Glucose-Capillary: 142 mg/dL — ABNORMAL HIGH (ref 70–99)
Glucose-Capillary: 170 mg/dL — ABNORMAL HIGH (ref 70–99)
Glucose-Capillary: 139 mg/dL — ABNORMAL HIGH (ref 70–99)
Glucose-Capillary: 168 mg/dL — ABNORMAL HIGH (ref 70–99)

## 2024-11-16 LAB — MAGNESIUM: Magnesium: 2.5 mg/dL — ABNORMAL HIGH (ref 1.7–2.4)

## 2024-11-16 MED ORDER — HYDROXYZINE HCL 25 MG PO TABS: 25.0000 mg | ORAL_TABLET | Freq: Every evening | ORAL | Status: DC | PRN

## 2024-11-16 MED ORDER — DICLOFENAC SODIUM 1 % EX GEL
2.0000 g | Freq: Four times a day (QID) | CUTANEOUS | Status: DC
  Administered 2024-11-16 – 2024-11-17 (×4): 2 g via TOPICAL
  Filled 2024-11-16: qty 100

## 2024-11-16 MED ORDER — ORAL CARE MOUTH RINSE: 15.0000 mL | OROMUCOSAL | Status: DC | PRN

## 2024-11-16 MED ORDER — HYDROXYZINE HCL 10 MG PO TABS
10.0000 mg | ORAL_TABLET | Freq: Every evening | ORAL | Status: DC | PRN
  Administered 2024-11-16: 10 mg via ORAL
  Filled 2024-11-16: qty 1

## 2024-11-16 NOTE — Progress Notes (Signed)
 " PROGRESS NOTE  Sierra Atkins FMW:[id number] DOB: 14-Aug-1966   PCP: Default, Provider, MD  Patient is from: Home.  Uses rolling walker at baseline.  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   In ED, SBP ranges from 150s to 180s.  VBG without significant finding.  CBC and labs without significant finding.  proBNP and serial troponin negative.  CT head without acute finding.  CT angio chest negative for PE but showed mild cardiomegaly and signs of pulmonary hypertension.  Patient was started on BiPAP, IV Lasix , steroid and nebulizers and admitted.  Breathing and edema improved with diuretics, breathing treatments and steroid. TE with normal LVEF G1DD and not able to visualize RV function.  Patient was diuresed with IV Lasix  and completed steroid course for COPD as well.  Therapy recommended SNF.  Medically stable for discharge.    Subjective: Seen and examined earlier this morning.  No major events overnight or this morning.  No complaints.    Assessment and plan: Dyspnea and dyspnea with exertion: Multifactorial including CHF, untreated OSA/OHS, PAH and COPD. Reports history of OSA but never used CPAP.  CT angio chest negative for PE or infiltrate but suggested PAH.  TTE with normal LVEF G1DD and not able to visualize RV function.  It is difficult to determine fluid status given her body habitus but edema has clearly improved.  Breathing has improved as well. - BiPAP at night and as needed. -Continue nebulizers. -Minimum oxygen to keep  saturation above 88% due to risk of CO2 retention. -Minimize sedating medications.   Acute on chronic diastolic CHF -TTE as above.  She is on Lasix  and Aldactone  at home.   Serial troponin and BNP negative.  CT angio chest with mild cardiomegaly and possible PAH.  Diuresed with IV Lasix  60 mg twice daily.  Appears euvolemic but difficult to assess due to body habitus.  Breathing has improved.   -Continue holding diuretics since she has excellent urine output with net negative with Farxiga alone. -Strict intake and output, daily weight, renal functions and electrolytes.  Possible COPD exacerbation -never diagnosed, but she is still smoking and has smoked for a while.  Had wheezing on presentation.  -Completed steroid course and wean to home prednisone  10 mg daily -Continue nebs in house -Encourage smoking cessation.   Essential hypertension/orthostatic hypotension:Takes Lasix  and Aldactone . Chronic pedal edema -Try compression socks. -Farxiga as above.   OSA/OHS: Probably the main problem.  Patient reports history of OSA but never wore CPAP -Needs outpatient follow-up in sleep clinic -BiPAP at night and as needed.    History of lupus: Stable. - Continue home prednisone  and Plaquenil .   Tobacco use-smoking about 5 cigarettes/day.   -Counseled for cessation  Depression/insomnia: Stable.  On Cymbalta . - Continue Cymbalta . - Continue Ambien  5 mg nightly.  DM-2: A1c 6.7%. - Continue metformin  and Jardiance .  Mild hyperkalemia: Resolved.  Chronic back pain/osteoarthritis/DDD: Intermittently requests IV Dilaudid . -Continue Percocet Percocet, Robaxin  and Cymbalta . -Avoid IV opiates. -PT/OT eval  Morbid obesity Body mass index is 56.73 kg/m.  DVT prophylaxis:  Place TED hose Start: 11/05/24 2156  Code Status: Full code Family Communication: Due to respiratory distress Level of care: Progressive Status is: Inpatient The patient will remain inpatient because:  Respiratory distress requiring BiPAP, acute CHF   Final disposition: SNF.   35 minutes with more than 50% spent in reviewing records, counseling patient/family and coordinating care.  Consultants:  None  Procedures: None  Microbiology summarized: None  Objective: Vitals:   11/16/24 0416 11/16/24 0500 11/16/24 1017 11/16/24 1036  BP: (!) 164/63   (!) 141/66  Pulse: 61   78  Resp: 17     Temp: 98.4 F (36.9 C)     TempSrc:      SpO2: 92%  94%   Weight:  (!) 164.3 kg    Height:        Examination:  GENERAL: No apparent distress.  Nontoxic. HEENT: MMM.  Vision and hearing grossly intact.  NECK: Supple.  No apparent JVD.  RESP:  No IWOB.  Fair aeration bilaterally. CVS:  RRR. Heart sounds normal.  ABD/GI/GU: BS+. Abd soft, NTND.  MSK/EXT:   No apparent deformity. Moves extremities.  Trace BLE edema. SKIN: no apparent skin lesion or wound NEURO: Awake and alert. Oriented appropriately.  No apparent focal neuro deficit. PSYCH: Calm. Normal affect.   Sch Meds:  Scheduled Meds:  arformoterol   15 mcg Nebulization BID   budesonide  (PULMICORT ) nebulizer solution  0.25 mg Nebulization BID   DULoxetine   60 mg Oral BID   empagliflozin   10 mg Oral Daily   enoxaparin  (LOVENOX ) injection  80 mg Subcutaneous Q24H   gabapentin   300 mg Oral TID   hydroxychloroquine   200 mg Oral Daily   insulin  aspart  0-15 Units Subcutaneous TID WC   losartan   50 mg Oral Daily   metFORMIN   500 mg Oral BID WC   pneumococcal 20-valent conjugate vaccine  0.5 mL Intramuscular Tomorrow-1000   predniSONE   10 mg Oral Q breakfast   Continuous Infusions: PRN Meds:.acetaminophen  **OR** acetaminophen , hydrocortisone  cream, ipratropium-albuterol , methocarbamol , ondansetron  **OR** ondansetron  (ZOFRAN ) IV, oxyCODONE -acetaminophen , zolpidem   Antimicrobials: Anti-infectives (From admission, onward)    Start     Dose/Rate Route Frequency Ordered Stop   11/15/24 0000  hydroxychloroquine  (PLAQUENIL ) 200 MG  tablet        200 mg Oral Daily 11/15/24 1203     11/08/24 1645  hydroxychloroquine  (PLAQUENIL ) tablet 200 mg        200 mg Oral Daily 11/08/24 1549     11/05/24 2200  hydroxychloroquine  (PLAQUENIL ) tablet 200 mg  Status:  Discontinued        200 mg Oral Daily 11/05/24 2155 11/06/24 1640   11/05/24 2030  ceFAZolin  (ANCEF ) IVPB 2g/100 mL premix        2 g 200 mL/hr over 30 Minutes Intravenous  Once 11/05/24 2025 11/05/24 2147        I have personally reviewed the following labs and images: CBC: Recent Labs  Lab 11/13/24 0328 11/16/24 0814  WBC 10.5 8.3  HGB 13.6 13.8  HCT 41.6 43.1  MCV 98.6 98.9  PLT 402* 364   BMP &GFR Recent Labs  Lab 11/11/24 0632 11/12/24 1648 11/12/24 1833 11/13/24 0952 11/16/24 0814  NA 136 134*  --  136 136  K 4.1 5.2* 5.2* 4.2 4.1  CL 93* 93*  --  95* 99  CO2 32 33*  --  30 25  GLUCOSE 195* 248*  --  179* 137*  BUN 24* 33*  --  32* 21*  CREATININE 0.79 1.05*  --  0.83 0.69  CALCIUM 9.3 9.4  --  9.6 9.5  MG  --  2.6*  --   --  2.5*   Estimated Creatinine Clearance: 127.3 mL/min (by C-G formula based on SCr of 0.69 mg/dL). Liver & Pancreas: Recent Labs  Lab 11/16/24 0814  AST 18  ALT 16  ALKPHOS 94  BILITOT 0.4  PROT 7.8  ALBUMIN 3.9    No results for input(s): LIPASE, AMYLASE in the last 168 hours. No results for input(s): AMMONIA in the last 168 hours.  Diabetic: No results for input(s): HGBA1C in the last 72 hours.  Recent Labs  Lab 11/15/24 1133 11/15/24 1702 11/15/24 2000 11/16/24 0723 11/16/24 1114  GLUCAP 162* 114* 167* 142* 170*   Cardiac Enzymes: No results for input(s): CKTOTAL, CKMB, CKMBINDEX, TROPONINI in the last 168 hours. Recent Labs    11/05/24 1650  PROBNP 82.2   Coagulation Profile: No results for input(s): INR, PROTIME in the last 168 hours. Thyroid Function Tests: No results for input(s): TSH, T4TOTAL, FREET4, T3FREE, THYROIDAB in the last 72 hours. Lipid  Profile: No results for input(s): CHOL, HDL, LDLCALC, TRIG, CHOLHDL, LDLDIRECT in the last 72 hours. Anemia Panel: No results for input(s): VITAMINB12, FOLATE, FERRITIN, TIBC, IRON, RETICCTPCT in the last 72 hours. Urine analysis:    Component Value Date/Time   COLORURINE YELLOW 12/05/2023 1724   APPEARANCEUR CLOUDY (A) 12/05/2023 1724   LABSPEC 1.025 12/05/2023 1724   PHURINE 6.5 12/05/2023 1724   GLUCOSEU NEGATIVE 12/05/2023 1724   HGBUR NEGATIVE 12/05/2023 1724   BILIRUBINUR NEGATIVE 12/05/2023 1724   KETONESUR NEGATIVE 12/05/2023 1724   PROTEINUR NEGATIVE 12/05/2023 1724   UROBILINOGEN 1.0 10/24/2011 0107   NITRITE NEGATIVE 12/05/2023 1724   LEUKOCYTESUR TRACE (A) 12/05/2023 1724   Sepsis Labs: Invalid input(s): PROCALCITONIN, LACTICIDVEN  Microbiology: No results found for this or any previous visit (from the past 240 hours).  Radiology Studies: No results found.     Sierra Atkins T. Burlene Montecalvo [facility]  If 7PM-7AM, please contact night-coverage [email or web address] 11/16/2024, 12:43 PM   "

## 2024-11-17 DIAGNOSIS — R0602 Shortness of breath: Secondary | ICD-10-CM | POA: Diagnosis not present

## 2024-11-17 DIAGNOSIS — I5033 Acute on chronic diastolic (congestive) heart failure: Secondary | ICD-10-CM | POA: Diagnosis not present

## 2024-11-17 LAB — GLUCOSE, CAPILLARY
Glucose-Capillary: 139 mg/dL — ABNORMAL HIGH (ref 70–99)
Glucose-Capillary: 136 mg/dL — ABNORMAL HIGH (ref 70–99)

## 2024-11-17 MED ORDER — INSULIN GLARGINE 100 UNIT/ML ~~LOC~~ SOLN: 15.0000 [IU] | Freq: Every day | SUBCUTANEOUS | Status: AC

## 2024-11-17 MED ORDER — ACETAMINOPHEN 325 MG PO TABS: 650.0000 mg | ORAL_TABLET | Freq: Four times a day (QID) | ORAL | Status: AC | PRN

## 2024-11-17 NOTE — Progress Notes (Signed)
 Physical Therapy Treatment Patient Details Name: Sierra Atkins MRN: [id number] DOB: 05-07-1966 Today's Date: 11/17/2024   History of Present Illness 58 y.o. female with medical history significant of morbid obesity, lupus, brain aneurysm with hemorrhage in 2019 with a shunt in place, hypertension, arthritis, tobacco use comes to the hospital with complaints of shortness of breath and leg swelling    PT Comments  PT - Cognition Comments: AxO x 3 pleasant.  Feeling better. Assisted OOB to amb required + 2 assist for safety.  General transfer comment: from elevated bed with forward momentum due to BMI.  General Gait Details: Tolerated a limited distance 16 feet on RA sats 97%, HR 122 and dyspnea only 1/4.  Much improved but not yet at prior mobility level/tolerance.  LPT has rec Pt will need ST Rehab at SNF to address mobility and functional decline prior to safely returning home.    If plan is discharge home, recommend the following: A lot of help with walking and/or transfers;A lot of help with bathing/dressing/bathroom;Assist for transportation;Help with stairs or ramp for entrance   Can travel by private vehicle     No  Equipment Recommendations  None recommended by PT    Recommendations for Other Services       Precautions / Restrictions Precautions Precautions: Fall Precaution/Restrictions Comments: monitor sats Restrictions Weight Bearing Restrictions Per Provider Order: No     Mobility  Bed Mobility   Bed Mobility: Supine to Sit     Supine to sit: Min assist     General bed mobility comments: use of rail, Pull with 1 UE to sitting    Transfers Overall transfer level: Needs assistance Equipment used: Rolling walker (2 wheels) Transfers: Sit to/from Stand Sit to Stand: Min assist, +2 safety/equipment           General transfer comment: from elevated bed with forward momentum due to BMI    Ambulation/Gait Ambulation/Gait assistance: Min assist, +2  safety/equipment Gait Distance (Feet): 16 Feet Assistive device: Rolling walker (2 wheels) Gait Pattern/deviations: Step-to pattern, Wide base of support Gait velocity: decr     General Gait Details: Tolerated a limited distance 16 feet on RA sats 97%, HR 122 and dyspnea only 1/4.  Much improved but not yet at prior mobility level/tolerance.   Stairs             Wheelchair Mobility     Tilt Bed    Modified Rankin (Stroke Patients Only)       Balance                                            Communication Communication Communication: No apparent difficulties  Cognition Arousal: Alert Behavior During Therapy: WFL for tasks assessed/performed   PT - Cognitive impairments: No apparent impairments                       PT - Cognition Comments: AxO x 3 pleasant.  Feeling better. Following commands: Intact      Cueing Cueing Techniques: Verbal cues  Exercises      General Comments        Pertinent Vitals/Pain Pain Assessment Pain Assessment: No/denies pain    Home Living                          Prior Function  PT Goals (current goals can now be found in the care plan section) Progress towards PT goals: Progressing toward goals    Frequency    Min 2X/week      PT Plan      Co-evaluation              AM-PAC PT 6 Clicks Mobility   Outcome Measure  Help needed turning from your back to your side while in a flat bed without using bedrails?: A Little Help needed moving from lying on your back to sitting on the side of a flat bed without using bedrails?: A Little Help needed moving to and from a bed to a chair (including a wheelchair)?: A Little Help needed standing up from a chair using your arms (e.g., wheelchair or bedside chair)?: A Lot Help needed to walk in hospital room?: A Lot Help needed climbing 3-5 steps with a railing? : Total 6 Click Score: 14    End of Session  Equipment Utilized During Treatment: Gait belt Activity Tolerance: Patient tolerated treatment well Patient left: in chair;with call bell/phone within reach;with chair alarm set Nurse Communication: Mobility status PT Visit Diagnosis: Unsteadiness on feet (R26.81);History of falling (Z91.81)     Time: 1314-1330 PT Time Calculation (min) (ACUTE ONLY): 16 min  Charges:    $Gait Training: 8-22 mins PT General Charges $$ ACUTE PT VISIT: 1 Visit                     Katheryn Leap  PTA Acute  Rehabilitation Services Office M-F          [phone]

## 2024-11-17 NOTE — TOC Progression Note (Addendum)
 Transition of Care [facility]) - Progression Note    Patient Details  Name: Sierra Atkins MRN: [id number] Date of Birth: [birth year]  Transition of Care [facility]) CM/SW Contact  Lorraine LILLETTE Fenton, LCSW Phone Number: 11/17/2024, 10:35 AM  Clinical Narrative:    Pt spoke to RN and stated that she is interested in the [facility] offer. CSW contacted [facility] and spoke with Darice in admissions , they will offer bed.  Darice then returned a call and stated  that they can offer a bed- will need an updated PT note to start Auth.  CSW sent  [place] to MD advising of need for PT note and agreed to share this with the pt.  CSW visited pt, clarified [facility] location in [place] offering a bed. Shared with her that they will need an updated PT note and then will request insurance Auth.  Pt please asked will it be approved soon so her daughter can bring her clothes here to the hospital.  CSW advised I would keep her posted  and not exactly certain when decision will come through.  ICM following.  Addendum: Darice called back- insurance authorized, advised Dr. PT did a final evaluation, cleared for DC.  Darice stated they have what they need to order Bipap . PT asked if [facility] would take her rollator, CSW agreed to advise them.  Room report given to RN, Pt contacted advised she is on the list and it will be more than 2 hours. [facility] agreed to bring rollator for pt.  No further ICM needs.   Expected Discharge Plan: Skilled Nursing Facility Barriers to Discharge: Continued Medical Work up               Expected Discharge Plan and Services In-house Referral: Clinical Social Work   Post Acute Care Choice: Skilled Nursing Facility Living arrangements for the past 2 months: Apartment                 DME Arranged: N/A DME Agency: NA                   Social Drivers of Health (SDOH) Interventions SDOH Screenings   Food Insecurity: No Food Insecurity (11/06/2024)  Housing: Low Risk  (11/06/2024)  Transportation Needs: No Transportation Needs (11/06/2024)  Utilities: Not At Risk (11/06/2024)  Financial Resource Strain: Low Risk (12/27/2023)   Received from [facility]  Physical Activity: Unknown (12/27/2023)   Received from [facility]  Social Connections: Socially Integrated (12/27/2023)   Received from [facility]  Stress: No Stress Concern Present (08/05/2024)   Received from [facility]  Tobacco Use: Medium Risk (11/05/2024)    Readmission Risk Interventions    12/06/2023   11:51 AM  Readmission Risk Prevention Plan  Transportation Screening Complete  PCP or Specialist Appt within 5-7 Days Complete  Home Care Screening Complete  Medication Review (RN CM) Complete

## 2024-11-17 NOTE — Progress Notes (Signed)
 RN has made multiple attempts to contact nursing staff at [facility] in [place] to give verbal report. All three attempts have been unsuccessful.

## 2024-11-17 NOTE — Plan of Care (Signed)
" °  Problem: Safety: Goal: Ability to remain free from injury will improve Outcome: Progressing   Problem: Skin Integrity: Goal: Risk for impaired skin integrity will decrease Outcome: Progressing   Problem: Elimination: Goal: Will not experience complications related to urinary retention Outcome: Progressing   Problem: Elimination: Goal: Will not experience complications related to bowel motility Outcome: Progressing   Problem: Nutrition: Goal: Adequate nutrition will be maintained Outcome: Progressing   "

## 2024-11-17 NOTE — Progress Notes (Signed)
" °   11/17/24 0017  BiPAP/CPAP/SIPAP  BiPAP/CPAP/SIPAP Pt Type Adult  BiPAP/CPAP/SIPAP Resmed  Mask Type Full face mask  Dentures removed? Not applicable  Mask Size Large  Respiratory Rate 16 breaths/min  IPAP 12 cmH20  EPAP 5 cmH2O  Flow Rate 2 lpm (bled into circuit)  Patient Home Machine No  Patient Home Mask No  Patient Home Tubing No  Auto Titrate No  CPAP/SIPAP surface wiped down Yes  Device Plugged into RED Power Outlet Yes    "

## 2024-11-17 NOTE — Discharge Summary (Signed)
 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    In ED, SBP ranges from 150s to 180s.  VBG without significant finding.  CBC and labs without significant finding.  proBNP and serial troponin negative.  CT head without acute finding.  CT angio chest negative for PE but showed mild cardiomegaly and signs of pulmonary hypertension.  Patient was started on BiPAP, IV Lasix , steroid and nebulizers and admitted.   Breathing and edema improved with diuretics, breathing treatments and steroid. TE with normal LVEF G1DD and not able to visualize RV function.  Patient was diuresed with IV Lasix  and completed steroid course for COPD as well.  She will be discharged on Jardiance .  She also needs BiPAP at night and when she sleeps.  Reassess fluid status, CMP and CBC in 1 week.    Therapy recommended SNF.  Medically stable for discharge.  See individual problem list below for more.   Problems addressed during this hospitalization Dyspnea and dyspnea with exertion: Multifactorial including CHF, untreated OSA/OHS, PAH and COPD. Reports history of OSA but never used CPAP.  CT angio chest negative for PE or infiltrate but suggested PAH.  TTE with normal LVEF G1DD and not able to visualize RV  function.  It is difficult to determine fluid status given her body habitus but edema has clearly improved.  Breathing has improved as well.  No oxygen requirement while awake. -BiPAP at night and as needed. -Continue nebulizers. -Minimize sedating medications.    Acute on chronic diastolic CHF -TTE as above.  She is on Lasix  and Aldactone  at home.   Serial troponin and BNP negative.  CT angio chest with mild cardiomegaly and possible PAH.  Diuresed with IV Lasix  60 mg twice daily.  Appears euvolemic but difficult to assess due to body habitus.  Breathing has improved.  Excellent urine output with net negative on Jardiance  alone. -Continue continue Jardiance  -Discontinued Lasix  and Aldactone .   Possible COPD exacerbation -never diagnosed, but she is still smoking.  -Completed steroid course and wean to home prednisone  10 mg daily -Continue nebs/inhalers in house -Encourage smoking cessation.   Essential hypertension/orthostatic hypotension: On Lasix  and Aldactone  at home.  Normotensive. -Lasix  and Aldactone  discontinued.  Chronic pedal edema -Try compression socks. - Continue Jardiance    OSA/OHS: Probably the main problem.  Patient reports history of OSA but never wore CPAP -Needs outpatient follow-up in sleep clinic -BiPAP at night and as needed.    History of lupus: Stable. - Continue home prednisone  and Plaquenil .   Tobacco use-smoking about 5 cigarettes/day.   -Counseled for cessation   Depression/insomnia: Stable.  On Cymbalta . - Continue Cymbalta . - Continue Ambien  5 mg nightly.   NIDDM-2 with hyperglycemia: A1c 6.7%.  Hyperglycemia likely due to steroid. Recent Labs  Lab 11/16/24 1114 11/16/24 1609 11/16/24 2047 11/17/24 0812 11/17/24 1227  GLUCAP 170* 139*  168* 139* 136*  -Continue Jardiance  and metformin  -Lantus  15 units daily.   Mild hyperkalemia: Resolved.   Chronic back pain/osteoarthritis/DDD: Intermittently requests IV Dilaudid . -Continue Tylenol ,  Percocet, Robaxin  and Cymbalta . - Continue PT/OT   Morbid obesity Body mass index is 57.39 kg/m.           Consultations: None  Time spent 35  minutes  Vital signs Vitals:   11/16/24 2042 11/17/24 0350 11/17/24 0536 11/17/24 1019  BP: 119/67  139/75   Pulse: 78  75   Temp: 98.5 F (36.9 C)  98.2 F (36.8 C)   Resp: (!) 24  20   Height:      Weight:  (!) 166.2 kg    SpO2: 98%  95% 98%  TempSrc: Oral  Oral   BMI (Calculated):  57.37       Discharge exam  GENERAL: No apparent distress.  Nontoxic. HEENT: MMM.  Vision and hearing grossly intact.  NECK: Supple.  No apparent JVD.  RESP:  No IWOB.  Fair aeration bilaterally but limited exam due to body habitus CVS:  RRR. Heart sounds normal.  ABD/GI/GU: BS+. Abd soft, NTND.  MSK/EXT:  Moves extremities. No apparent deformity.  Trace BLE edema. SKIN: no apparent skin lesion or wound NEURO: Awake and alert. Oriented appropriately.  No apparent focal neuro deficit. PSYCH: Calm. Normal affect.   Discharge Instructions Discharge Instructions     Increase activity slowly   Complete by: As directed       Allergies as of 11/17/2024       Reactions   Fentanyl Shortness Of Breath   Morphine Shortness Of Breath, Other (See Comments)   Had to have CPR performed on her!!   Codeine Hives, Itching, Other (See Comments)   Welts, too        Medication List     STOP taking these medications    Dulera 100-5 MCG/ACT Aero Generic drug: mometasone -formoterol  Replaced by: mometasone -formoterol  200-5 MCG/ACT Aero   furosemide  80 MG tablet Commonly known as: LASIX    ibuprofen  800 MG tablet Commonly known as: ADVIL    oxyCODONE  15 MG immediate release tablet Commonly known as: ROXICODONE    Oxycodone  HCl 10 MG Tabs   potassium chloride  SA 20 MEQ tablet Commonly known as: KLOR-CON  M   spironolactone  50 MG tablet Commonly known as: ALDACTONE        TAKE these medications    acetaminophen  325 MG  tablet Commonly known as: TYLENOL  Take 2 tablets (650 mg total) by mouth every 6 (six) hours as needed for mild pain (pain score 1-3) or fever (or Fever >/= 101).   diclofenac  Sodium 1 % Gel Commonly known as: VOLTAREN  Apply 2-4 g topically 4 (four) times daily as needed (for pain- hips or other affected areas).   DULoxetine  60 MG capsule Commonly known as: CYMBALTA  Take 1 capsule (60 mg total) by mouth 2 (two) times daily.   empagliflozin  10 MG Tabs tablet Commonly known as: JARDIANCE  Take 1 tablet (10 mg total) by mouth daily.   fluticasone  50 MCG/ACT nasal spray Commonly known as: FLONASE  Place 1 spray into both nostrils 2 (two) times daily as needed for allergies or rhinitis. What changed: Another medication with the same name was removed. Continue taking this medication, and follow the directions you see here.   gabapentin  300 MG capsule Commonly known as: NEURONTIN  Take 300 mg by mouth 3 (three) times daily.   hydroxychloroquine  200 MG tablet Commonly known as: PLAQUENIL  Take 1 tablet (200 mg total)  by mouth daily.   insulin  glargine 100 UNIT/ML injection Commonly known as: LANTUS  Inject 0.15 mLs (15 Units total) into the skin daily.   losartan  50 MG tablet Commonly known as: COZAAR  Take 1 tablet (50 mg total) by mouth daily.   metFORMIN  500 MG tablet Commonly known as: GLUCOPHAGE  Take 1 tablet (500 mg total) by mouth 2 (two) times daily with a meal.   methocarbamol  500 MG tablet Commonly known as: ROBAXIN  Take 1 tablet (500 mg total) by mouth every 8 (eight) hours as needed for muscle spasms.   mometasone -formoterol  200-5 MCG/ACT Aero Commonly known as: DULERA Inhale 2 puffs into the lungs in the morning and at bedtime. Replaces: Dulera 100-5 MCG/ACT Aero   oxyCODONE -acetaminophen  5-325 MG tablet Commonly known as: PERCOCET/ROXICET Take 2 tablets by mouth every 6 (six) hours as needed for severe pain (pain score 7-10). What changed:  how much to take when  to take this   predniSONE  10 MG tablet Commonly known as: DELTASONE  Take 10 mg by mouth daily with breakfast.   traZODone  50 MG tablet Commonly known as: DESYREL  Take 50 mg by mouth at bedtime.   Ventolin  HFA 108 (90 Base) MCG/ACT inhaler Generic drug: albuterol  Inhale 2 puffs into the lungs every 2 (two) hours as needed for shortness of breath or wheezing.   zolpidem  5 MG tablet Commonly known as: AMBIEN  Take 1 tablet (5 mg total) by mouth at bedtime as needed for sleep. What changed:  medication strength how much to take when to take this reasons to take this Another medication with the same name was removed. Continue taking this medication, and follow the directions you see here.         Procedures/Studies:   ECHOCARDIOGRAM COMPLETE Result Date: 11/07/2024    ECHOCARDIOGRAM REPORT   Patient Name:   Sierra Atkins Date of Exam: 11/07/2024 Medical Rec #:  [id number]       Height:       67.0 in Accession #:    [id number]      Weight:       315.0 lb Date of Birth:  [birth year]      BSA:          2.453 m Patient Age:    58 years        BP:           132/81 mmHg Patient Gender: F               HR:           80 bpm. Exam Location:  Inpatient Procedure: 2D Echo, Cardiac Doppler, Color Doppler and Intracardiac            Opacification Agent (Both Spectral and Color Flow Doppler were            utilized during procedure). Indications:    Dyspnea R06.00  History:        Patient has prior history of Echocardiogram examinations, most                 recent 12/06/2023. Risk Factors:Hypertension.  Sonographer:    Tinnie Gosling RDCS Referring Phys: [id number] Asar Evilsizer T Antion Andres IMPRESSIONS  1. Left ventricular ejection fraction, by estimation, is 60 to 65%. The left ventricle has normal function. The left ventricle has no regional wall motion abnormalities. The left ventricular internal cavity size was mildly dilated. Left ventricular diastolic parameters are consistent with Grade I diastolic dysfunction  (impaired relaxation).  2. Right ventricular systolic function was  not well visualized. The right ventricular size is not well visualized. Tricuspid regurgitation signal is inadequate for assessing PA pressure.  3. Left atrial size was mildly dilated.  4. The mitral valve is grossly normal. No evidence of mitral valve regurgitation. No evidence of mitral stenosis.  5. The aortic valve is tricuspid. There is mild calcification of the aortic valve. Aortic valve regurgitation is not visualized. Aortic valve sclerosis/calcification is present, without any evidence of aortic stenosis. Comparison(s): No significant change from prior study. Prior images reviewed side by side. FINDINGS  Left Ventricle: Left ventricular ejection fraction, by estimation, is 60 to 65%. The left ventricle has normal function. The left ventricle has no regional wall motion abnormalities. The left ventricular internal cavity size was mildly dilated. There is  no concentric left ventricular hypertrophy. Left ventricular diastolic parameters are consistent with Grade I diastolic dysfunction (impaired relaxation). Right Ventricle: The right ventricular size is not well visualized. Right vetricular wall thickness was not well visualized. Right ventricular systolic function was not well visualized. Tricuspid regurgitation signal is inadequate for assessing PA pressure. Left Atrium: Left atrial size was mildly dilated. Right Atrium: Right atrial size was not well visualized. Pericardium: There is no evidence of pericardial effusion. Mitral Valve: The mitral valve is grossly normal. No evidence of mitral valve regurgitation. No evidence of mitral valve stenosis. Tricuspid Valve: The tricuspid valve is not well visualized. Aortic Valve: The aortic valve is tricuspid. There is mild calcification of the aortic valve. Aortic valve regurgitation is not visualized. Aortic valve sclerosis/calcification is present, without any evidence of aortic stenosis.  Pulmonic Valve: The pulmonic valve was not well visualized. Pulmonic valve regurgitation is not visualized. No evidence of pulmonic stenosis. Aorta: The aortic root and ascending aorta are structurally normal, with no evidence of dilitation. IAS/Shunts: The interatrial septum was not well visualized.  LEFT VENTRICLE PLAX 2D LVIDd:         5.10 cm   Diastology LVIDs:         4.40 cm   LV e' medial:    10.40 cm/s LV PW:         1.70 cm   LV E/e' medial:  6.6 LV IVS:        1.70 cm   LV e' lateral:   6.42 cm/s LVOT diam:     2.50 cm   LV E/e' lateral: 10.7 LV SV:         83 LV SV Index:   34 LVOT Area:     4.91 cm  LEFT ATRIUM         Index LA diam:    3.80 cm 1.55 cm/m  AORTIC VALVE LVOT Vmax:   103.00 cm/s LVOT Vmean:  65.700 cm/s LVOT VTI:    0.170 m  AORTA Ao Root diam: 3.20 cm Ao Asc diam:  3.50 cm MITRAL VALVE MV Area (PHT): 3.74 cm     SHUNTS MV Decel Time: 203 msec     Systemic VTI:  0.17 m MV E velocity: 68.70 cm/s   Systemic Diam: 2.50 cm MV A velocity: 104.00 cm/s MV E/A ratio:  0.66 Mihai Croitoru MD Electronically signed by Jerel Balding MD Signature Date/Time: 11/07/2024/12:56:07 PM    Final    CT Angio Chest PE W/Cm &/Or Wo Cm Result Date: 11/05/2024 EXAM: CTA of the Chest with contrast for PE 11/05/2024 07:47:05 PM TECHNIQUE: CTA of the chest was performed after the administration of 75 mL of iohexol  (OMNIPAQUE ) 350 MG/ML injection. Multiplanar reformatted  images are provided for review. MIP images are provided for review. Automated exposure control, iterative reconstruction, and/or weight based adjustment of the mA/kV was utilized to reduce the radiation dose to as low as reasonably achievable. COMPARISON: None available. CLINICAL HISTORY: Pulmonary embolism (PE) suspected, high prob. FINDINGS: PULMONARY ARTERIES: Pulmonary arteries are adequately opacified for evaluation. No pulmonary embolism. Enlargement of the main pulmonary artery, suggesting pulmonary arterial hypertension. MEDIASTINUM:  Mild cardiomegaly. Mild thoracic aortic atherosclerosis. LYMPH NODES: No mediastinal, hilar or axillary lymphadenopathy. LUNGS AND PLEURA: Mild bibasilar atelectasis. No focal consolidation or pulmonary edema. No pleural effusion or pneumothorax. UPPER ABDOMEN: Limited images of the upper abdomen are unremarkable. SOFT TISSUES AND BONES: Degenerative changes of the visualized thoracolumbar spine. No acute soft tissue abnormality. IMPRESSION: 1. No pulmonary embolism. 2. Mild cardiomegaly. 3. Suspected pulmonary arterial hypertension. Electronically signed by: Pinkie Pebbles MD 11/05/2024 07:53 PM EST RP Workstation: [id number]   CT Head Wo Contrast Result Date: 11/05/2024 CLINICAL DATA:  Fall yesterday. Altered mental status. Dizziness. Hypertension. EXAM: CT HEAD WITHOUT CONTRAST TECHNIQUE: Contiguous axial images were obtained from the base of the skull through the vertex without intravenous contrast. RADIATION DOSE REDUCTION: This exam was performed according to the departmental dose-optimization program which includes automated exposure control, adjustment of the mA and/or kV according to patient size and/or use of iterative reconstruction technique. COMPARISON:  06/20/2023 FINDINGS: Brain: No evidence of intracranial hemorrhage, acute infarction, extra-axial collection, or mass lesion/mass effect. Right posterior ventriculostomy remains in place with tip in the region of the 3rd ventricle. No evidence of hydrocephalus. Vascular:  No hyperdense vessel or other acute findings. Skull: No evidence of fracture or other significant bone abnormality. Prior suboccipital craniotomy again noted. Sinuses/Orbits:  No acute findings. Other: None. IMPRESSION: No acute intracranial abnormality. Right posterior ventriculostomy with tip near the 3rd ventricle. No evidence of hydrocephalus. Electronically Signed   By: Norleen DELENA Kil M.D.   On: 11/05/2024 16:30   DG Chest Portable 1 View Result Date: 11/05/2024 CLINICAL  DATA:  SHOB EXAM: PORTABLE CHEST 1 VIEW COMPARISON:  October 10, 2024, August 13, 2022 FINDINGS: Cardiomediastinal silhouette is enlarged. Similar appearance of enlarged pulmonary arteries. Background of mild diffuse peribronchial cuffing, similar compared to more remote priors. Masslike density along the RIGHT superior hilar border, likely summation of vascular shadows and cardiomegaly exacerbated by AP technique. And pleural effusion or pneumothorax. IMPRESSION: 1. Masslike density along the RIGHT superior hilar border, likely summation of vascular shadows and cardiomegaly exacerbated by AP technique. Recommend dedicated PA and lateral chest radiograph for improved evaluation. 2. Similar appearance of cardiomegaly and enlarged pulmonary arteries as can be seen in pulmonary arterial hypertension. Electronically Signed   By: Corean Salter M.D.   On: 11/05/2024 13:07       The results of significant diagnostics from this hospitalization (including imaging, microbiology, ancillary and laboratory) are listed below for reference.     Microbiology: No results found for this or any previous visit (from the past 240 hours).   Labs:  CBC: Recent Labs  Lab 11/13/24 0328 11/16/24 0814  WBC 10.5 8.3  HGB 13.6 13.8  HCT 41.6 43.1  MCV 98.6 98.9  PLT 402* 364   BMP &GFR Recent Labs  Lab 11/11/24 0632 11/12/24 1648 11/12/24 1833 11/13/24 0952 11/16/24 0814  NA 136 134*  --  136 136  K 4.1 5.2* 5.2* 4.2 4.1  CL 93* 93*  --  95* 99  CO2 32 33*  --  30 25  GLUCOSE 195*  248*  --  179* 137*  BUN 24* 33*  --  32* 21*  CREATININE 0.79 1.05*  --  0.83 0.69  CALCIUM 9.3 9.4  --  9.6 9.5  MG  --  2.6*  --   --  2.5*   Estimated Creatinine Clearance: 128.2 mL/min (by C-G formula based on SCr of 0.69 mg/dL). Liver & Pancreas: Recent Labs  Lab 11/16/24 0814  AST 18  ALT 16  ALKPHOS 94  BILITOT 0.4  PROT 7.8  ALBUMIN 3.9   No results for input(s): LIPASE, AMYLASE in the last  168 hours. No results for input(s): AMMONIA in the last 168 hours. Diabetic: No results for input(s): HGBA1C in the last 72 hours. Recent Labs  Lab 11/16/24 1114 11/16/24 1609 11/16/24 2047 11/17/24 0812 11/17/24 1227  GLUCAP 170* 139* 168* 139* 136*   Cardiac Enzymes: No results for input(s): CKTOTAL, CKMB, CKMBINDEX, TROPONINI in the last 168 hours. Recent Labs    11/05/24 1650  PROBNP 82.2   Coagulation Profile: No results for input(s): INR, PROTIME in the last 168 hours. Thyroid Function Tests: No results for input(s): TSH, T4TOTAL, FREET4, T3FREE, THYROIDAB in the last 72 hours. Lipid Profile: No results for input(s): CHOL, HDL, LDLCALC, TRIG, CHOLHDL, LDLDIRECT in the last 72 hours. Anemia Panel: No results for input(s): VITAMINB12, FOLATE, FERRITIN, TIBC, IRON, RETICCTPCT in the last 72 hours. Urine analysis:    Component Value Date/Time   COLORURINE YELLOW 12/05/2023 1724   APPEARANCEUR CLOUDY (A) 12/05/2023 1724   LABSPEC 1.025 12/05/2023 1724   PHURINE 6.5 12/05/2023 1724   GLUCOSEU NEGATIVE 12/05/2023 1724   HGBUR NEGATIVE 12/05/2023 1724   BILIRUBINUR NEGATIVE 12/05/2023 1724   KETONESUR NEGATIVE 12/05/2023 1724   PROTEINUR NEGATIVE 12/05/2023 1724   UROBILINOGEN 1.0 10/24/2011 0107   NITRITE NEGATIVE 12/05/2023 1724   LEUKOCYTESUR TRACE (A) 12/05/2023 1724   Sepsis Labs: Invalid input(s): PROCALCITONIN, LACTICIDVEN   SIGNED:  Alek Poncedeleon T Sarha Bartelt, MD  [facility] 11/17/2024, 12:41 PM   "

## 2024-11-17 NOTE — Progress Notes (Signed)
 " PROGRESS NOTE  Sierra Atkins FMW:[id number] DOB: 1966-04-21   PCP: Default, Provider, MD  Patient is from: Home.  Uses rolling walker at baseline.  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   In ED, SBP ranges from 150s to 180s.  VBG without significant finding.  CBC and labs without significant finding.  proBNP and serial troponin negative.  CT head without acute finding.  CT angio chest negative for PE but showed mild cardiomegaly and signs of pulmonary hypertension.  Patient was started on BiPAP, IV Lasix , steroid and nebulizers and admitted.  Breathing and edema improved with diuretics, breathing treatments and steroid. TE with normal LVEF G1DD and not able to visualize RV function.  Patient was diuresed with IV Lasix  and completed steroid course for COPD as well.  Therapy recommended SNF.  Medically stable for discharge.  Subjective: Seen and examined earlier this morning.  No major events overnight or this morning.  No complaints.    Assessment and plan: Dyspnea and dyspnea with exertion: Multifactorial including CHF, untreated OSA/OHS, PAH and COPD. Reports history of OSA but never used CPAP.  CT angio chest negative for PE or infiltrate but suggested PAH.  TTE with normal LVEF G1DD and not able to visualize RV function.  It is difficult to determine fluid status given her body habitus but edema has clearly improved.  Breathing has improved as well. - BiPAP at night and as needed. -Continue nebulizers. -Minimum oxygen to keep saturation  above 88% due to risk of CO2 retention. -Minimize sedating medications.   Acute on chronic diastolic CHF -TTE as above.  She is on Lasix  and Aldactone  at home.   Serial troponin and BNP negative.  CT angio chest with mild cardiomegaly and possible PAH.  Diuresed with IV Lasix  60 mg twice daily.  Appears euvolemic but difficult to assess due to body habitus.  Breathing has improved.   -Continue holding diuretics since she has excellent urine output with net negative with Farxiga alone. -Strict intake and output, daily weight, renal functions and electrolytes.  Possible COPD exacerbation -never diagnosed, but she is still smoking and has smoked for a while.  Had wheezing on presentation.  -Completed steroid course and wean to home prednisone  10 mg daily -Continue nebs in house -Encourage smoking cessation.   Essential hypertension/orthostatic hypotension:Takes Lasix  and Aldactone . Chronic pedal edema -Try compression socks. -Farxiga as above.   OSA/OHS: Probably the main problem.  Patient reports history of OSA but never wore CPAP -Needs outpatient follow-up in sleep clinic -BiPAP at night and as needed.    History of lupus: Stable. - Continue home prednisone  and Plaquenil .   Tobacco use-smoking about 5 cigarettes/day.   -Counseled for cessation  Depression/insomnia: Stable.  On Cymbalta . - Continue Cymbalta . - Continue Ambien  5 mg nightly.  DM-2: A1c 6.7%. - Continue metformin  and Jardiance .  Mild hyperkalemia: Resolved.  Chronic back pain/osteoarthritis/DDD: Intermittently requests IV Dilaudid . -Continue Percocet Percocet, Robaxin  and Cymbalta . -Avoid IV opiates. -PT/OT eval  Morbid obesity Body mass index is 57.39 kg/m.  DVT prophylaxis:  Place TED hose Start: 11/05/24 2156  Code Status: Full code Family Communication: Due to respiratory distress Level of care: Progressive Status is: Inpatient The patient will remain inpatient because: Respiratory  distress requiring BiPAP, acute CHF   Final disposition: SNF.   35 minutes with more than 50% spent in reviewing records, counseling patient/family and coordinating care.  Consultants:  None  Procedures: None  Microbiology summarized: None  Objective: Vitals:   11/16/24 2042 11/17/24 0350 11/17/24 0536 11/17/24 1019  BP: 119/67  139/75   Pulse: 78  75   Resp: (!) 24  20   Temp: 98.5 F (36.9 C)  98.2 F (36.8 C)   TempSrc: Oral  Oral   SpO2: 98%  95% 98%  Weight:  (!) 166.2 kg    Height:        Examination:  GENERAL: No apparent distress.  Nontoxic. HEENT: MMM.  Vision and hearing grossly intact.  NECK: Supple.  No apparent JVD.  RESP:  No IWOB.  Fair aeration bilaterally. CVS:  RRR. Heart sounds normal.  ABD/GI/GU: BS+. Abd soft, NTND.  MSK/EXT:   No apparent deformity. Moves extremities.  Trace BLE edema. SKIN: no apparent skin lesion or wound NEURO: Awake and alert. Oriented appropriately.  No apparent focal neuro deficit. PSYCH: Calm. Normal affect.   Sch Meds:  Scheduled Meds:  arformoterol   15 mcg Nebulization BID   budesonide  (PULMICORT ) nebulizer solution  0.25 mg Nebulization BID   diclofenac  Sodium  2 g Topical QID   DULoxetine   60 mg Oral BID   empagliflozin   10 mg Oral Daily   enoxaparin  (LOVENOX ) injection  80 mg Subcutaneous Q24H   gabapentin   300 mg Oral TID   hydroxychloroquine   200 mg Oral Daily   insulin  aspart  0-15 Units Subcutaneous TID WC   losartan   50 mg Oral Daily   metFORMIN   500 mg Oral BID WC   pneumococcal 20-valent conjugate vaccine  0.5 mL Intramuscular Tomorrow-1000   predniSONE   10 mg Oral Q breakfast   Continuous Infusions: PRN Meds:.acetaminophen  **OR** acetaminophen , hydrocortisone  cream, hydrOXYzine , ipratropium-albuterol , methocarbamol , ondansetron  **OR** ondansetron  (ZOFRAN ) IV, mouth rinse, oxyCODONE -acetaminophen , zolpidem   Antimicrobials: Anti-infectives (From admission, onward)    Start     Dose/Rate Route  Frequency Ordered Stop   11/15/24 0000  hydroxychloroquine  (PLAQUENIL ) 200 MG tablet        200 mg Oral Daily 11/15/24 1203     11/08/24 1645  hydroxychloroquine  (PLAQUENIL ) tablet 200 mg        200 mg Oral Daily 11/08/24 1549     11/05/24 2200  hydroxychloroquine  (PLAQUENIL ) tablet 200 mg  Status:  Discontinued        200 mg Oral Daily 11/05/24 2155 11/06/24 1640   11/05/24 2030  ceFAZolin  (ANCEF ) IVPB 2g/100 mL premix        2 g 200 mL/hr over 30 Minutes Intravenous  Once 11/05/24 2025 11/05/24 2147        I have personally reviewed the following labs and images: CBC: Recent Labs  Lab 11/13/24 0328 11/16/24 0814  WBC 10.5 8.3  HGB 13.6 13.8  HCT 41.6 43.1  MCV 98.6 98.9  PLT 402* 364   BMP &GFR Recent Labs  Lab 11/11/24 0632 11/12/24 1648 11/12/24 1833 11/13/24 0952 11/16/24 0814  NA 136 134*  --  136 136  K 4.1 5.2* 5.2* 4.2 4.1  CL 93* 93*  --  95* 99  CO2 32 33*  --  30 25  GLUCOSE 195* 248*  --  179* 137*  BUN 24* 33*  --  32* 21*  CREATININE 0.79 1.05*  --  0.83 0.69  CALCIUM 9.3 9.4  --  9.6 9.5  MG  --  2.6*  --   --  2.5*   Estimated Creatinine Clearance: 128.2 mL/min (by C-G formula based on SCr of 0.69 mg/dL). Liver & Pancreas: Recent Labs  Lab 11/16/24 0814  AST 18  ALT 16  ALKPHOS 94  BILITOT 0.4  PROT 7.8  ALBUMIN 3.9    No results for input(s): LIPASE, AMYLASE in the last 168 hours. No results for input(s): AMMONIA in the last 168 hours.  Diabetic: No results for input(s): HGBA1C in the last 72 hours.  Recent Labs  Lab 11/16/24 0723 11/16/24 1114 11/16/24 1609 11/16/24 2047 11/17/24 0812  GLUCAP 142* 170* 139* 168* 139*   Cardiac Enzymes: No results for input(s): CKTOTAL, CKMB, CKMBINDEX, TROPONINI in the last 168 hours. Recent Labs    11/05/24 1650  PROBNP 82.2   Coagulation Profile: No results for input(s): INR, PROTIME in the last 168 hours. Thyroid Function Tests: No results for input(s): TSH,  T4TOTAL, FREET4, T3FREE, THYROIDAB in the last 72 hours. Lipid Profile: No results for input(s): CHOL, HDL, LDLCALC, TRIG, CHOLHDL, LDLDIRECT in the last 72 hours. Anemia Panel: No results for input(s): VITAMINB12, FOLATE, FERRITIN, TIBC, IRON, RETICCTPCT in the last 72 hours. Urine analysis:    Component Value Date/Time   COLORURINE YELLOW 12/05/2023 1724   APPEARANCEUR CLOUDY (A) 12/05/2023 1724   LABSPEC 1.025 12/05/2023 1724   PHURINE 6.5 12/05/2023 1724   GLUCOSEU NEGATIVE 12/05/2023 1724   HGBUR NEGATIVE 12/05/2023 1724   BILIRUBINUR NEGATIVE 12/05/2023 1724   KETONESUR NEGATIVE 12/05/2023 1724   PROTEINUR NEGATIVE 12/05/2023 1724   UROBILINOGEN 1.0 10/24/2011 0107   NITRITE NEGATIVE 12/05/2023 1724   LEUKOCYTESUR TRACE (A) 12/05/2023 1724   Sepsis Labs: Invalid input(s): PROCALCITONIN, LACTICIDVEN  Microbiology: No results found for this or any previous visit (from the past 240 hours).  Radiology Studies: No results found.     Fue Cervenka T. Shekera Beavers [facility]  If 7PM-7AM, please contact night-coverage [email or web address] 11/17/2024, 12:25 PM   "

## 2024-12-21 ENCOUNTER — Ambulatory Visit: Admitting: Neuroradiology

## 2024-12-27 ENCOUNTER — Ambulatory Visit: Admitting: Neuroradiology

## 2024-12-27 ENCOUNTER — Encounter: Payer: Self-pay | Admitting: Neuroradiology

## 2024-12-27 VITALS — BP 115/71 | HR 82 | Temp 98.1°F | Ht 69.5 in | Wt 380.0 lb

## 2024-12-27 DIAGNOSIS — Z95828 Presence of other vascular implants and grafts: Secondary | ICD-10-CM | POA: Diagnosis not present

## 2024-12-27 DIAGNOSIS — R42 Dizziness and giddiness: Secondary | ICD-10-CM | POA: Diagnosis not present

## 2024-12-27 DIAGNOSIS — I671 Cerebral aneurysm, nonruptured: Secondary | ICD-10-CM | POA: Diagnosis not present

## 2024-12-27 DIAGNOSIS — Z982 Presence of cerebrospinal fluid drainage device: Secondary | ICD-10-CM | POA: Diagnosis not present

## 2024-12-27 MED ORDER — MECLIZINE HCL 25 MG PO TABS: 25.0000 mg | ORAL_TABLET | Freq: Three times a day (TID) | ORAL | 0 refills | Status: AC | PRN

## 2024-12-27 NOTE — Progress Notes (Signed)
 I had the pleasure of seeing Sierra Atkins in the office today for follow-up of a brain aneurysm.  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  She has had follow-up imaging, the last CTA was 06/20/2023 which I reviewed.  This shows no recurrence of the aneurysm.  There is a neck remnant, which was necessary due to the PICA arising from the neck of the aneurysm.  She complains of positional vertigo.  She has a ventriculoperitoneal shunt in place.  She has not tried meclizine  for her bouts of vertigo.  We can give this a trial.  I will schedule a follow-up MRA in 6 months.  If that shows no recurrence, further imaging will not be necessary.

## 2025-01-24 ENCOUNTER — Ambulatory Visit: Admitting: Neuroradiology
